# Patient Record
Sex: Female | Born: 1970 | Race: Black or African American | Hispanic: No | Marital: Single | Smoking: Former smoker
Health system: Southern US, Community
[De-identification: ages and names within clinical notes are randomized; demographics above are authoritative.]

## PROBLEM LIST (undated history)

## (undated) DIAGNOSIS — B009 Herpesviral infection, unspecified: Secondary | ICD-10-CM

## (undated) DIAGNOSIS — D219 Benign neoplasm of connective and other soft tissue, unspecified: Secondary | ICD-10-CM

## (undated) DIAGNOSIS — E7800 Pure hypercholesterolemia, unspecified: Secondary | ICD-10-CM

## (undated) DIAGNOSIS — R519 Headache, unspecified: Secondary | ICD-10-CM

## (undated) DIAGNOSIS — Z8619 Personal history of other infectious and parasitic diseases: Secondary | ICD-10-CM

## (undated) DIAGNOSIS — R51 Headache: Secondary | ICD-10-CM

## (undated) HISTORY — PX: CERVICAL BIOPSY: SHX590

---

## 2001-06-23 ENCOUNTER — Emergency Department (HOSPITAL_COMMUNITY): Admission: EM | Admit: 2001-06-23 | Discharge: 2001-06-23 | Payer: Self-pay | Admitting: Emergency Medicine

## 2001-07-23 ENCOUNTER — Emergency Department (HOSPITAL_COMMUNITY): Admission: EM | Admit: 2001-07-23 | Discharge: 2001-07-23 | Payer: Self-pay | Admitting: Emergency Medicine

## 2001-08-31 ENCOUNTER — Emergency Department (HOSPITAL_COMMUNITY): Admission: EM | Admit: 2001-08-31 | Discharge: 2001-08-31 | Payer: Self-pay | Admitting: Emergency Medicine

## 2001-09-07 ENCOUNTER — Ambulatory Visit (HOSPITAL_COMMUNITY): Admission: RE | Admit: 2001-09-07 | Discharge: 2001-09-07 | Payer: Self-pay | Admitting: Internal Medicine

## 2001-09-08 ENCOUNTER — Encounter: Payer: Self-pay | Admitting: Internal Medicine

## 2002-02-23 ENCOUNTER — Emergency Department (HOSPITAL_COMMUNITY): Admission: EM | Admit: 2002-02-23 | Discharge: 2002-02-23 | Payer: Self-pay | Admitting: Emergency Medicine

## 2003-03-03 ENCOUNTER — Encounter: Payer: Self-pay | Admitting: *Deleted

## 2003-03-03 ENCOUNTER — Emergency Department (HOSPITAL_COMMUNITY): Admission: EM | Admit: 2003-03-03 | Discharge: 2003-03-03 | Payer: Self-pay | Admitting: *Deleted

## 2003-12-26 ENCOUNTER — Emergency Department (HOSPITAL_COMMUNITY): Admission: EM | Admit: 2003-12-26 | Discharge: 2003-12-26 | Payer: Self-pay | Admitting: Emergency Medicine

## 2004-03-24 ENCOUNTER — Emergency Department (HOSPITAL_COMMUNITY): Admission: EM | Admit: 2004-03-24 | Discharge: 2004-03-24 | Payer: Self-pay | Admitting: Emergency Medicine

## 2004-04-22 ENCOUNTER — Emergency Department (HOSPITAL_COMMUNITY): Admission: EM | Admit: 2004-04-22 | Discharge: 2004-04-22 | Payer: Self-pay | Admitting: Emergency Medicine

## 2005-04-10 ENCOUNTER — Ambulatory Visit (HOSPITAL_COMMUNITY): Admission: RE | Admit: 2005-04-10 | Discharge: 2005-04-10 | Payer: Self-pay | Admitting: Internal Medicine

## 2005-04-16 ENCOUNTER — Emergency Department (HOSPITAL_COMMUNITY): Admission: EM | Admit: 2005-04-16 | Discharge: 2005-04-16 | Payer: Self-pay | Admitting: Emergency Medicine

## 2006-06-12 ENCOUNTER — Emergency Department (HOSPITAL_COMMUNITY): Admission: EM | Admit: 2006-06-12 | Discharge: 2006-06-12 | Payer: Self-pay | Admitting: Emergency Medicine

## 2007-03-11 ENCOUNTER — Emergency Department (HOSPITAL_COMMUNITY): Admission: EM | Admit: 2007-03-11 | Discharge: 2007-03-11 | Payer: Self-pay | Admitting: Emergency Medicine

## 2007-03-12 ENCOUNTER — Ambulatory Visit (HOSPITAL_COMMUNITY): Admission: AD | Admit: 2007-03-12 | Discharge: 2007-03-12 | Payer: Self-pay | Admitting: Obstetrics & Gynecology

## 2007-07-15 ENCOUNTER — Ambulatory Visit (HOSPITAL_COMMUNITY): Admission: RE | Admit: 2007-07-15 | Discharge: 2007-07-15 | Payer: Self-pay | Admitting: Obstetrics and Gynecology

## 2007-07-16 ENCOUNTER — Emergency Department (HOSPITAL_COMMUNITY): Admission: EM | Admit: 2007-07-16 | Discharge: 2007-07-16 | Payer: Self-pay | Admitting: Emergency Medicine

## 2007-07-22 ENCOUNTER — Inpatient Hospital Stay (HOSPITAL_COMMUNITY): Admission: AD | Admit: 2007-07-22 | Discharge: 2007-07-25 | Payer: Self-pay | Admitting: Obstetrics and Gynecology

## 2007-10-07 ENCOUNTER — Emergency Department (HOSPITAL_COMMUNITY): Admission: EM | Admit: 2007-10-07 | Discharge: 2007-10-07 | Payer: Self-pay | Admitting: Emergency Medicine

## 2008-09-25 ENCOUNTER — Emergency Department (HOSPITAL_COMMUNITY): Admission: EM | Admit: 2008-09-25 | Discharge: 2008-09-25 | Payer: Self-pay | Admitting: Emergency Medicine

## 2008-10-10 ENCOUNTER — Other Ambulatory Visit: Admission: RE | Admit: 2008-10-10 | Discharge: 2008-10-10 | Payer: Self-pay | Admitting: Obstetrics and Gynecology

## 2008-12-18 ENCOUNTER — Emergency Department (HOSPITAL_COMMUNITY): Admission: EM | Admit: 2008-12-18 | Discharge: 2008-12-18 | Payer: Self-pay | Admitting: Emergency Medicine

## 2008-12-26 ENCOUNTER — Emergency Department (HOSPITAL_COMMUNITY): Admission: EM | Admit: 2008-12-26 | Discharge: 2008-12-26 | Payer: Self-pay | Admitting: Emergency Medicine

## 2009-02-15 ENCOUNTER — Emergency Department (HOSPITAL_COMMUNITY): Admission: EM | Admit: 2009-02-15 | Discharge: 2009-02-15 | Payer: Self-pay | Admitting: Emergency Medicine

## 2009-03-01 ENCOUNTER — Emergency Department (HOSPITAL_COMMUNITY): Admission: EM | Admit: 2009-03-01 | Discharge: 2009-03-01 | Payer: Self-pay | Admitting: Emergency Medicine

## 2009-08-06 ENCOUNTER — Inpatient Hospital Stay (HOSPITAL_COMMUNITY): Admission: AD | Admit: 2009-08-06 | Discharge: 2009-08-06 | Payer: Self-pay | Admitting: Family Medicine

## 2009-08-09 ENCOUNTER — Emergency Department (HOSPITAL_COMMUNITY): Admission: EM | Admit: 2009-08-09 | Discharge: 2009-08-09 | Payer: Self-pay | Admitting: Emergency Medicine

## 2009-09-12 ENCOUNTER — Inpatient Hospital Stay (HOSPITAL_COMMUNITY): Admission: AD | Admit: 2009-09-12 | Discharge: 2009-09-12 | Payer: Self-pay | Admitting: Obstetrics & Gynecology

## 2009-10-02 ENCOUNTER — Ambulatory Visit: Payer: Self-pay | Admitting: Family Medicine

## 2009-10-02 ENCOUNTER — Inpatient Hospital Stay (HOSPITAL_COMMUNITY): Admission: AD | Admit: 2009-10-02 | Discharge: 2009-10-04 | Payer: Self-pay | Admitting: Obstetrics and Gynecology

## 2009-10-02 ENCOUNTER — Encounter: Payer: Self-pay | Admitting: Obstetrics and Gynecology

## 2010-07-28 ENCOUNTER — Emergency Department (HOSPITAL_COMMUNITY): Admission: EM | Admit: 2010-07-28 | Discharge: 2010-07-28 | Payer: Self-pay | Admitting: Family Medicine

## 2010-07-29 ENCOUNTER — Emergency Department (HOSPITAL_COMMUNITY): Admission: EM | Admit: 2010-07-29 | Discharge: 2010-07-29 | Payer: Self-pay | Admitting: Emergency Medicine

## 2010-10-29 ENCOUNTER — Emergency Department (HOSPITAL_COMMUNITY)
Admission: EM | Admit: 2010-10-29 | Discharge: 2010-10-29 | Payer: Self-pay | Source: Home / Self Care | Admitting: Emergency Medicine

## 2011-03-04 LAB — GLUCOSE, CAPILLARY
Glucose-Capillary: 105 mg/dL — ABNORMAL HIGH (ref 70–99)
Glucose-Capillary: 226 mg/dL — ABNORMAL HIGH (ref 70–99)
Glucose-Capillary: 53 mg/dL — ABNORMAL LOW (ref 70–99)
Glucose-Capillary: 55 mg/dL — ABNORMAL LOW (ref 70–99)
Glucose-Capillary: 65 mg/dL — ABNORMAL LOW (ref 70–99)
Glucose-Capillary: 74 mg/dL (ref 70–99)

## 2011-03-04 LAB — CBC
HCT: 32.3 % — ABNORMAL LOW (ref 36.0–46.0)
MCHC: 32.4 g/dL (ref 30.0–36.0)
RBC: 4.28 MIL/uL (ref 3.87–5.11)
RDW: 18.8 % — ABNORMAL HIGH (ref 11.5–15.5)
WBC: 10.9 10*3/uL — ABNORMAL HIGH (ref 4.0–10.5)

## 2011-03-05 LAB — COMPREHENSIVE METABOLIC PANEL
AST: 20 U/L (ref 0–37)
Albumin: 2.5 g/dL — ABNORMAL LOW (ref 3.5–5.2)
Alkaline Phosphatase: 107 U/L (ref 39–117)
GFR calc non Af Amer: >60 mL/min (ref >60)
Total Protein: 5.9 g/dL — ABNORMAL LOW (ref 6.0–8.3)

## 2011-03-05 LAB — CBC
Hemoglobin: 9.8 g/dL — ABNORMAL LOW (ref 12.0–15.0)
MCHC: 32.3 g/dL (ref 30.0–36.0)
RBC: 3.98 MIL/uL (ref 3.87–5.11)
RDW: 17.4 % — ABNORMAL HIGH (ref 11.5–15.5)

## 2011-03-05 LAB — URINALYSIS, ROUTINE W REFLEX MICROSCOPIC
Ketones, ur: NEGATIVE mg/dL
Nitrite: NEGATIVE
Urobilinogen, UA: 0.2 mg/dL (ref 0.0–1.0)

## 2011-03-11 LAB — WOUND CULTURE

## 2011-03-12 LAB — PREGNANCY, URINE: Preg Test, Ur: POSITIVE

## 2011-04-14 NOTE — Consult Note (Signed)
Tanya Austin, Tanya Austin NO.:  [id number]   MEDICAL RECORD NO.:  [id number]          PATIENT TYPE:  EMS   LOCATION:  ED                            FACILITY:  [facility]   PHYSICIAN:  Tilda Burrow, M.D. DATE OF BIRTH:  Apr 03, 1971   DATE OF CONSULTATION:  07/16/2007  DATE OF DISCHARGE:                                 CONSULTATION   This 40 year old female gravida 2, para 1, with diabetic pregnancy  followed at [facility], now just [redacted] weeks gestation presents  with complaints of elevated blood pressures.  She is being followed in  our office, and has had a pre-eclampsia workup this week with 24-hr  urine protein of approximately 180 mg/day protein.  She is diabetic and  is being managed with approximately 200 units of insulin per day.   She presents to the emergency room, where evaluation includes blood  pressure check showing:  blood pressure 141/77, pulse 95, respiratory  rate 20, temperature 97.8 at 1:15 p.m.  She was observed for a period of  time.  Her blood pressures remained in this range.  There are no  documented blood pressures greater than 90 diastolic, or greater than  145 systolic.   LABORATORY EVALUATION:  Urinalysis negative for protein, and negative  for evidence of infection.  Liver function tests are normal, with AST  23, ALT 18, BUN 6, creatinine 0.58.   The patient is observed and fetal monitoring has been done; reported to  me as being transmitted to [facility] for review.  The reflexes are 2+.  The baby is reported as active.  We will continue with the current plan,  which is to see her back on Tuesday for a cervical examination and  consideration of proceeding toward selecting induction date.      Tilda Burrow, M.D.  Electronically Signed     JVF/MEDQ  D:  07/16/2007  T:  07/17/2007  Job:  [id number]

## 2011-04-14 NOTE — H&P (Signed)
Tanya Austin, Tanya Austin             ACCOUNT NO.:  [id number]   MEDICAL RECORD NO.:  [id number]          PATIENT TYPE:  INP   LOCATION:  9120                          FACILITY:  [facility]   PHYSICIAN:  Lazaro Arms, M.D.   DATE OF BIRTH:  07/09/1971   DATE OF ADMISSION:  07/22/2007  DATE OF DISCHARGE:                              HISTORY & PHYSICAL   HISTORY OF PRESENT ILLNESS:  Tanya Austin is a 40 year old, African-American  female, G3, P2, AB0 with estimated date of delivery of August 01, 2007, currently at 38-5/7 weeks' gestation who is admitted for induction  of labor.  Pregnancy is complicated by Class B diabetes mellitus.  She  had also a couple of episodic elevated blood pressures for a 24-hour  urine and labs have always been normal and her blood pressures have  always normalized.  She has undergone twice weekly antepartum testing  all of which has been reassuring and she had an estimated fetal weight  at 36 weeks which put her at the 39th percentile of growth which would  go out to about 3300 g at this point.  The patient has been getting  increasingly swollen, uncomfortable in the last week an a half, so we  are going to proceed with induction of labor because of her diabetes  basically at 39 weeks.  Her hemoglobin A1c has progressively come down  during the pregnancy and her blood sugars, while she has been somewhat  sporadic in checking her blood sugars, have always been under good  control.  At the time of her last visit, she was on 60 of NPH and 50 of  regular in the morning, 44 NPH and 50 regular in the evening.  She has  not been very compliant with her diet.  The pregnancy, otherwise, has  been unremarkable and she has had no other antepartum problems.   PAST MEDICAL HISTORY:  Diabetes.   PAST SURGICAL HISTORY:  Negative.   ALLERGIES:  No known drug allergies.   PAST OBSTETRICAL HISTORY:  Vaginal deliveries in 1990, and 1994.   MEDICATIONS:  Zyrtec, prenatal vitamins,  Actos prior to the pregnancy  and metformin.  She was switched over to insulin.   PRENATAL LABORATORY DATA:  Blood type B positive.  Varicella immune.  Rubella immune.  Antibody screen was negative.  Hepatitis B was  negative.  HIV was negative.  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  Pap smear was normal with  negative HPV.  Serology was negative x2.  GC and Chlamydia were negative  x2.  Group B Streptococcus was negative.  AFP was normal.  She has been  anemic during the pregnancy.  She has had difficulty keeping up with her  vitamins and iron.   REVIEW OF SYSTEMS:  Otherwise negative.   PHYSICAL EXAMINATION:  HEENT:  Unremarkable.  NECK:  Thyroid normal.  LUNGS:  Clear.  HEART:  Regular rate and rhythm without murmurs, rubs or gallops.  BREASTS:  Deferred.  ABDOMEN:  Fundal height of 41 cm.  PELVIC:  Cervix is  essentially closed, 50% effaced, soft, posterior,  vertex and -3 station.  EXTREMITIES:  Warm with 2+ edema.  DTRs are 2+ with no clonus.   IMPRESSION:  1. Intrauterine pregnancy at 38-5/[redacted] weeks gestation.  2. Class B diabetes mellitus, under good control.  3. Unfavorable cervix.   PLAN:  The patient is admitted for Foley bulb ripening of cervix  followed by Pitocin induction of labor.  She understands the indications  for induction, the risks, benefits and will proceed.      Lazaro Arms, M.D.  Electronically Signed     LHE/MEDQ  D:  07/22/2007  T:  07/23/2007  Job:  [id number]

## 2011-04-14 NOTE — Op Note (Signed)
[name], [name]             ACCOUNT NO.:  [id number]   MEDICAL RECORD NO.:  [id number]          PATIENT TYPE:  INP   LOCATION:  9120                          FACILITY:  [facility]   PHYSICIAN:  Lazaro Arms, M.D.   DATE OF BIRTH:  1971/04/09   DATE OF PROCEDURE:  07/23/2007  DATE OF DISCHARGE:                               OPERATIVE REPORT   DELIVERY NOTE   Tanya Austin is a 40 year old African-American female, gravida 3, para 2,  abortus 0, estimated date of delivery September 1 at 23 and 6/7 weeks of  gestation who was admitted for induction of labor because of her class B  diabetes.  She has had reassuring fetal heart rate tracings over the  last several weeks twice weekly.  Blood sugars have been under fairly  good control despite sporadic checks and fairly poor diet compliance.   The patient was brought in, a Foley bulb was placed.  She underwent  amniotomy and then Pitocin induction of labor and subsequently had an  epidural placed.  She progressed nicely through the active phase of  labor and over an intact perineum delivered a delivered a viable female  infant at 17 with Apgars of 8 and 9, weighing 5 pounds and 15 ounces.  There is three-vessel cord.  Cord blood and cord gas were sent.  Placenta was delivered intact at 4:08.  It was somewhat calcified, the  uterus was firm below the umbilicus.  Blood loss for delivery was 250  mL.  The infant did have some terminal bradycardia and had deep  variables last several minutes but she was making good progress at  recovery and cord gas turned out to be pH 7.09 with I am sure a heavy  respiratory component, high PCO2.  Certainly no evidence of any  metabolic component.   The patient will undergo routine postpartum care.  We will cut her  insulin back to essentially early pregnancy levels which is right at  half of her current dosing.  Try to keep her essentially under  reasonable control but not too tight in this postpartum period.   Infant  will undergo routine neonatal care.      Lazaro Arms, M.D.  Electronically Signed     LHE/MEDQ  D:  07/23/2007  T:  07/24/2007  Job:  [id number]

## 2011-04-17 NOTE — Procedures (Signed)
[facility]  Patient:    Tanya Austin, Tanya Austin Visit Number: [id number] MRN: [id number]          Service Type: OUT Location: RAD Attending Physician:  Avon Gully Dictated by:   Avon Gully, M.D. Admit Date:  09/07/2001                            EKG Interpretations  PROCEDURE:  Stress Cardiolite.  INDICATION:  Chest pain.  DESCRIPTION OF PROCEDURE:  Resting EKG was obtained which showed normal sinus rhythm with a rate of 80 beats per minute.  There are no T wave or ST segment abnormalities.  Resting blood pressure was 120/68.  Patient was exercised according to [facility] standard protocol.  She was exercised for 5 minutes and 20 seconds and achieved 7.0 METs.  The maximum blood pressure was 170/50.  The maximum heart rate was 166 beats per minute, which was 86% of the maximum predicted heart rate.  There were no symptoms of chest pain or shortness of breath during the procedure.  There were no EKG changes suggestive of stress-induced ischemia.  Cardiolite was injected at the maximum heart rate. Patient has tolerated the procedure without any problem.  The patient is going to have the nuclear scan and the result will be followed. Dictated by:   Avon Gully, M.D. Attending Physician:  Avon Gully DD:  09/07/01 TD:  09/07/01 Job: [id number]

## 2011-04-17 NOTE — Procedures (Signed)
[facility]  Patient:    Tanya Austin, Tanya Austin Visit Number: [id number] MRN: [id number]          Service Type: OUT Location: RAD Attending Physician:  Avon Gully Dictated by:   Avon Gully, M.D. Proc. Date: 09/07/01 Admit Date:  09/07/2001 Discharge Date: 09/07/2001                                Stress Test    PROCEDURE:  Stress Cardiolite.  INDICATION:  Chest pain.  DESCRIPTION OF PROCEDURE:  Resting EKG was obtained which showed a normal sinus rhythm with a rate of 80 beats per minute.  There are no significant ST segment or T wave abnormalities. Dictated by:   Avon Gully, M.D. Attending Physician:  Avon Gully DD:  09/07/01 TD:  09/07/01 Job: [id number]

## 2011-05-02 ENCOUNTER — Emergency Department (HOSPITAL_COMMUNITY)
Admission: EM | Admit: 2011-05-02 | Discharge: 2011-05-02 | Disposition: A | Discharge status: Home / Self Care | Payer: Medicaid Other | Attending: Emergency Medicine | Admitting: Emergency Medicine

## 2011-05-02 DIAGNOSIS — B9789 Other viral agents as the cause of diseases classified elsewhere: Secondary | ICD-10-CM | POA: Insufficient documentation

## 2011-05-02 DIAGNOSIS — H9209 Otalgia, unspecified ear: Secondary | ICD-10-CM | POA: Insufficient documentation

## 2011-05-02 DIAGNOSIS — J029 Acute pharyngitis, unspecified: Secondary | ICD-10-CM | POA: Insufficient documentation

## 2011-05-02 DIAGNOSIS — E119 Type 2 diabetes mellitus without complications: Secondary | ICD-10-CM | POA: Insufficient documentation

## 2011-05-02 DIAGNOSIS — J3489 Other specified disorders of nose and nasal sinuses: Secondary | ICD-10-CM | POA: Insufficient documentation

## 2011-05-02 DIAGNOSIS — E785 Hyperlipidemia, unspecified: Secondary | ICD-10-CM | POA: Insufficient documentation

## 2011-05-06 ENCOUNTER — Emergency Department (HOSPITAL_COMMUNITY)
Admission: EM | Admit: 2011-05-06 | Discharge: 2011-05-06 | Disposition: A | Discharge status: Home / Self Care | Payer: Medicaid Other | Attending: Emergency Medicine | Admitting: Emergency Medicine

## 2011-05-06 DIAGNOSIS — E785 Hyperlipidemia, unspecified: Secondary | ICD-10-CM | POA: Insufficient documentation

## 2011-05-06 DIAGNOSIS — E119 Type 2 diabetes mellitus without complications: Secondary | ICD-10-CM | POA: Insufficient documentation

## 2011-05-06 DIAGNOSIS — J3489 Other specified disorders of nose and nasal sinuses: Secondary | ICD-10-CM | POA: Insufficient documentation

## 2011-05-06 DIAGNOSIS — J329 Chronic sinusitis, unspecified: Secondary | ICD-10-CM | POA: Insufficient documentation

## 2011-08-31 LAB — GLUCOSE, CAPILLARY: Glucose-Capillary: 98

## 2011-09-11 LAB — COMPREHENSIVE METABOLIC PANEL
ALT: 18
AST: 52 — ABNORMAL HIGH
Albumin: 2.5 — ABNORMAL LOW
Alkaline Phosphatase: 101
Chloride: 106
Creatinine, Ser: 0.52
GFR calc Af Amer: >60
Potassium: 5.4 — ABNORMAL HIGH
Sodium: 133 — ABNORMAL LOW
Total Bilirubin: 1.3 — ABNORMAL HIGH

## 2011-09-11 LAB — BASIC METABOLIC PANEL
BUN: 6
CO2: 25
Calcium: 8.6
Chloride: 108
Creatinine, Ser: 0.58

## 2011-09-11 LAB — URIC ACID: Uric Acid, Serum: 5.3

## 2011-09-11 LAB — HEPATIC FUNCTION PANEL
Alkaline Phosphatase: 107
Bilirubin, Direct: <0.1
Total Bilirubin: 0.5

## 2011-09-11 LAB — URINALYSIS, ROUTINE W REFLEX MICROSCOPIC

## 2011-09-11 LAB — URINALYSIS, DIPSTICK ONLY
Bilirubin Urine: NEGATIVE
Glucose, UA: NEGATIVE
Hgb urine dipstick: NEGATIVE
Protein, ur: NEGATIVE

## 2011-09-11 LAB — CBC
Platelets: 290
WBC: 10

## 2011-12-27 ENCOUNTER — Encounter (HOSPITAL_COMMUNITY): Payer: Self-pay | Admitting: Emergency Medicine

## 2011-12-27 ENCOUNTER — Emergency Department (HOSPITAL_COMMUNITY)
Admission: EM | Admit: 2011-12-27 | Discharge: 2011-12-27 | Disposition: A | Discharge status: Home / Self Care | Payer: Medicaid Other | Attending: Emergency Medicine | Admitting: Emergency Medicine

## 2011-12-27 DIAGNOSIS — J019 Acute sinusitis, unspecified: Secondary | ICD-10-CM | POA: Insufficient documentation

## 2011-12-27 DIAGNOSIS — J3489 Other specified disorders of nose and nasal sinuses: Secondary | ICD-10-CM | POA: Insufficient documentation

## 2011-12-27 DIAGNOSIS — R51 Headache: Secondary | ICD-10-CM | POA: Insufficient documentation

## 2011-12-27 DIAGNOSIS — E7800 Pure hypercholesterolemia, unspecified: Secondary | ICD-10-CM | POA: Insufficient documentation

## 2011-12-27 DIAGNOSIS — Z79899 Other long term (current) drug therapy: Secondary | ICD-10-CM | POA: Insufficient documentation

## 2011-12-27 DIAGNOSIS — E119 Type 2 diabetes mellitus without complications: Secondary | ICD-10-CM | POA: Insufficient documentation

## 2011-12-27 HISTORY — DX: Pure hypercholesterolemia, unspecified: E78.00

## 2011-12-27 MED ORDER — AMOXICILLIN 500 MG PO CAPS: 500.0000 mg | ORAL_CAPSULE | Freq: Three times a day (TID) | ORAL | Status: AC

## 2011-12-27 MED ORDER — HYDROCODONE-ACETAMINOPHEN 5-325 MG PO TABS: 1.0000 | ORAL_TABLET | ORAL | Status: AC | PRN

## 2011-12-27 MED ORDER — OXYMETAZOLINE HCL 0.05 % NA SOLN
2.0000 | Freq: Once | NASAL | Status: AC
  Administered 2011-12-27: 2 via NASAL
  Filled 2011-12-27: qty 15

## 2011-12-27 MED ORDER — HYDROCODONE-ACETAMINOPHEN 5-325 MG PO TABS
1.0000 | ORAL_TABLET | Freq: Once | ORAL | Status: AC
  Administered 2011-12-27: 1 via ORAL
  Filled 2011-12-27: qty 1

## 2011-12-27 NOTE — ED Notes (Signed)
C/o sinus pressure x 1 week.  Denies cough.  Reports runny nose, sore throat, ear pain, and congestion.

## 2011-12-27 NOTE — ED Provider Notes (Signed)
History     CSN: [id number]  Arrival date & time 12/27/11  1830   First MD Initiated Contact with Patient 12/27/11 2043      Chief Complaint  Patient presents with  . Sinus Problem    (Consider location/radiation/quality/duration/timing/severity/associated sxs/prior treatment) Patient is a 41 y.o. female presenting with sinus complaint. The history is provided by the patient.  Sinus Problem This is a new problem. The current episode started in the past 7 days. The problem occurs constantly. The problem has been gradually worsening. Associated symptoms include congestion. Pertinent negatives include no swollen glands.  Patient reports a week long history of worsening sinus congestion and pressure that has now evolved into bilateral facial pain that radiates into her teeth. States mild cold symptoms started last Monday, but have progressed over the last several days. States she is unable to breathe through either side of her nose.  Past Medical History  Diagnosis Date  . Diabetes mellitus   . High cholesterol     History reviewed. No pertinent past surgical history.  No family history on file.  History  Substance Use Topics  . Smoking status: Never Smoker   . Smokeless tobacco: Not on file  . Alcohol Use: No    OB History    Grav Para Term Preterm Abortions TAB SAB Ect Mult Living                  Review of Systems  Constitutional: Negative.   HENT: Positive for congestion.   Eyes: Negative.   Respiratory: Negative.   Cardiovascular: Negative.   Gastrointestinal: Negative.   Genitourinary: Negative.   Musculoskeletal: Negative.   Skin: Negative.   Neurological: Negative.   Hematological: Negative.   Psychiatric/Behavioral: Negative.     Allergies  Review of patient's allergies indicates no known allergies.  Home Medications   Current Outpatient Rx  Name Route Sig Dispense Refill  . GLYBURIDE-METFORMIN 2.5-500 MG PO TABS Oral Take 1-2 tablets by mouth 2  (two) times daily with a meal. Takes 2 tablets in the morning, and 1 tablet at night.    Darlis Loan ESTRAD TRIPHASIC 0.18/0.215/0.25 MG-35 MCG PO TABS Oral Take 1 tablet by mouth daily.    Marland Kitchen PIOGLITAZONE HCL 30 MG PO TABS Oral Take 30 mg by mouth daily.    Marland Kitchen SIMVASTATIN 20 MG PO TABS Oral Take 20 mg by mouth every evening.    Marland Kitchen AMOXICILLIN 500 MG PO CAPS Oral Take 1 capsule (500 mg total) by mouth 3 (three) times daily. 30 capsule 0  . HYDROCODONE-ACETAMINOPHEN 5-325 MG PO TABS Oral Take 1 tablet by mouth every 4 (four) hours as needed for pain. 10 tablet 0    BP 145/76  Pulse 98  Temp(Src) 98.5 F (36.9 C) (Oral)  Resp 18  SpO2 99%  LMP 12/07/2011  Physical Exam  Constitutional: She is oriented to person, place, and time. She appears well-developed and well-nourished.  HENT:  Head: Normocephalic and atraumatic.  Right Ear: Hearing, tympanic membrane, external ear and ear canal normal.  Left Ear: Hearing, tympanic membrane, external ear and ear canal normal.  Nose: Mucosal edema, rhinorrhea and sinus tenderness present. Right sinus exhibits maxillary sinus tenderness and frontal sinus tenderness. Left sinus exhibits maxillary sinus tenderness and frontal sinus tenderness.  Mouth/Throat: Uvula is midline, oropharynx is clear and moist and mucous membranes are normal.  Eyes: Conjunctivae are normal.  Neck: Neck supple.  Cardiovascular: Normal rate and regular rhythm.   Pulmonary/Chest: Effort normal and  breath sounds normal.  Musculoskeletal: Normal range of motion.  Neurological: She is alert and oriented to person, place, and time.  Skin: Skin is warm and dry. No erythema.  Psychiatric: She has a normal mood and affect.    ED Course  Procedures Findings and clinical impression discussed with patient. Will plan for discharge home on Amoxicillin for acute sinusitis and encourage follow up with her primary care physician if not improving over the next several days. Patient  agreeable with plan.  Labs Reviewed - No data to display No results found.   1. Sinusitis, acute       MDM  HPI/PE and clinical findings c/w acute sinusitis.        Leanne Chang, NP 12/27/11 [phone]

## 2011-12-27 NOTE — ED Notes (Signed)
Pt to ED c/o sinus pressure/congestion that radiates to her teeth and into her ears x 1 week.  Her eyes are also red with pus.  Pt states her child recently experienced the same symptoms.

## 2011-12-28 NOTE — ED Provider Notes (Signed)
Medical screening examination/treatment/procedure(s) were performed by non-physician practitioner and as supervising physician I was immediately available for consultation/collaboration.   Dayton Bailiff, MD 12/28/11 [phone]

## 2012-04-18 ENCOUNTER — Encounter (HOSPITAL_COMMUNITY): Payer: Self-pay | Admitting: *Deleted

## 2012-04-18 ENCOUNTER — Emergency Department (HOSPITAL_COMMUNITY)
Admission: EM | Admit: 2012-04-18 | Discharge: 2012-04-18 | Disposition: A | Discharge status: Home / Self Care | Payer: Medicaid Other | Attending: Emergency Medicine | Admitting: Emergency Medicine

## 2012-04-18 DIAGNOSIS — E119 Type 2 diabetes mellitus without complications: Secondary | ICD-10-CM | POA: Insufficient documentation

## 2012-04-18 DIAGNOSIS — E7800 Pure hypercholesterolemia, unspecified: Secondary | ICD-10-CM | POA: Insufficient documentation

## 2012-04-18 DIAGNOSIS — J069 Acute upper respiratory infection, unspecified: Secondary | ICD-10-CM | POA: Insufficient documentation

## 2012-04-18 MED ORDER — PROMETHAZINE-DM 6.25-15 MG/5ML PO SYRP: 5.0000 mL | ORAL_SOLUTION | Freq: Four times a day (QID) | ORAL | Status: AC | PRN

## 2012-04-18 MED ORDER — BUDESONIDE 32 MCG/ACT NA SUSP: 2.0000 | Freq: Every day | NASAL | Status: DC

## 2012-04-18 MED ORDER — GUAIFENESIN ER 1200 MG PO TB12: 1.0000 | ORAL_TABLET | Freq: Two times a day (BID) | ORAL | Status: DC

## 2012-04-18 NOTE — Discharge Instructions (Signed)
Return here as needed. Follow up with your doctor. Increase your fluids as well.

## 2012-04-18 NOTE — ED Notes (Signed)
PT ambulated with a  Steady gait; VSS; A&Ox3; no signs of distress; respirations even and unlabored; skin warm and dry; no questions at this time.  

## 2012-04-18 NOTE — ED Notes (Signed)
To ED for eval of cough and congestion for the past cple days. No fevers.

## 2012-04-18 NOTE — ED Provider Notes (Signed)
History     CSN: [id number]  Arrival date & time 04/18/12  1478   First MD Initiated Contact with Patient 04/18/12 1857      Chief Complaint  Patient presents with  . Cough    (Consider location/radiation/quality/duration/timing/severity/associated sxs/prior treatment) HPI Patient presents emergency Dept. with nasal congestion, and cough for the last 2 days.  She states, that she is not having any fevers, nausea, vomiting, diarrhea, abdominal pain, chest pain, shortness of breath, headache, or weakness.  Patient, states she had tried any treatment at home for any of her symptoms.  She states she feels like she has problems with seasonal allergies.  She states, that she has not on any medications for this.  Patient, states nothing seems to make her condition worse or better.  Past Medical History  Diagnosis Date  . Diabetes mellitus   . High cholesterol     History reviewed. No pertinent past surgical history.  History reviewed. No pertinent family history.  History  Substance Use Topics  . Smoking status: Never Smoker   . Smokeless tobacco: Not on file  . Alcohol Use: No    OB History    Grav Para Term Preterm Abortions TAB SAB Ect Mult Living                  Review of Systems All other systems negative except as documented in the HPI. All pertinent positives and negatives as reviewed in the HPI.   Allergies  Review of patient's allergies indicates no known allergies.  Home Medications   Current Outpatient Rx  Name Route Sig Dispense Refill  . FERROUS FUMARATE 325 (106 FE) MG PO TABS Oral Take 1 tablet by mouth daily.    . OMEGA-3 FATTY ACIDS 1000 MG PO CAPS Oral Take 1 g by mouth daily.    . GLYBURIDE-METFORMIN 2.5-500 MG PO TABS Oral Take 1-2 tablets by mouth 2 (two) times daily with a meal. Takes 2 tablets in the morning, and 1 tablet at night.    Darlis Loan ESTRAD TRIPHASIC 0.18/0.215/0.25 MG-35 MCG PO TABS Oral Take 1 tablet by mouth daily.    Marland Kitchen  PIOGLITAZONE HCL 30 MG PO TABS Oral Take 30 mg by mouth daily.    Marland Kitchen SIMVASTATIN 20 MG PO TABS Oral Take 20 mg by mouth daily.       BP 123/61  Pulse 77  Temp(Src) 98.3 F (36.8 C) (Oral)  Resp 16  SpO2 99%  Physical Exam Physical Examination: General appearance - alert, well appearing, and in no distress, oriented to person, place, and time and normal appearing weight Mental status - alert, oriented to person, place, and time Eyes - pupils equal and reactive, extraocular eye movements intact Ears - bilateral TM's and external ear canals normal Nose - normal and patent, no erythema, discharge or polyps Mouth - mucous membranes moist, pharynx normal without lesions Neck - supple, no significant adenopathy Lymphatics - no palpable lymphadenopathy, no hepatosplenomegaly Chest - clear to auscultation, no wheezes, rales or rhonchi, symmetric air entry, no tachypnea, retractions or cyanosis Heart - normal rate, regular rhythm, normal S1, S2, no murmurs, rubs, clicks or gallops Skin - normal coloration and turgor, no rashes, no suspicious skin lesions noted  ED Course  Procedures (including critical care time)   Will be treated for URI vs allergic symptoms. Told to follow up with her PCP. Increase her fluids.   MDM          Carlyle Dolly, PA-C  04/18/12 1946 

## 2012-04-21 NOTE — ED Provider Notes (Signed)
Medical screening examination/treatment/procedure(s) were performed by non-physician practitioner and as supervising physician I was immediately available for consultation/collaboration.  Cheri Guppy, MD 04/21/12 [phone]

## 2012-05-30 ENCOUNTER — Encounter (INDEPENDENT_AMBULATORY_CARE_PROVIDER_SITE_OTHER): Payer: Medicaid Other | Admitting: Ophthalmology

## 2012-06-08 ENCOUNTER — Encounter (INDEPENDENT_AMBULATORY_CARE_PROVIDER_SITE_OTHER): Payer: Medicaid Other | Admitting: Ophthalmology

## 2012-06-08 DIAGNOSIS — E1139 Type 2 diabetes mellitus with other diabetic ophthalmic complication: Secondary | ICD-10-CM

## 2012-06-08 DIAGNOSIS — H43819 Vitreous degeneration, unspecified eye: Secondary | ICD-10-CM

## 2012-06-08 DIAGNOSIS — H18619 Keratoconus, stable, unspecified eye: Secondary | ICD-10-CM

## 2012-06-08 DIAGNOSIS — E11319 Type 2 diabetes mellitus with unspecified diabetic retinopathy without macular edema: Secondary | ICD-10-CM

## 2012-06-08 DIAGNOSIS — H33309 Unspecified retinal break, unspecified eye: Secondary | ICD-10-CM

## 2012-09-05 ENCOUNTER — Other Ambulatory Visit: Payer: Self-pay | Admitting: Family Medicine

## 2012-09-12 ENCOUNTER — Encounter (HOSPITAL_COMMUNITY): Payer: Self-pay | Admitting: Emergency Medicine

## 2012-09-12 ENCOUNTER — Emergency Department (HOSPITAL_COMMUNITY)
Admission: EM | Admit: 2012-09-12 | Discharge: 2012-09-12 | Disposition: A | Discharge status: Home Health | Payer: Medicaid Other | Attending: Emergency Medicine | Admitting: Emergency Medicine

## 2012-09-12 ENCOUNTER — Emergency Department (HOSPITAL_COMMUNITY): Payer: Medicaid Other

## 2012-09-12 DIAGNOSIS — R0789 Other chest pain: Secondary | ICD-10-CM

## 2012-09-12 DIAGNOSIS — R071 Chest pain on breathing: Secondary | ICD-10-CM | POA: Insufficient documentation

## 2012-09-12 DIAGNOSIS — K219 Gastro-esophageal reflux disease without esophagitis: Secondary | ICD-10-CM | POA: Insufficient documentation

## 2012-09-12 DIAGNOSIS — J309 Allergic rhinitis, unspecified: Secondary | ICD-10-CM | POA: Insufficient documentation

## 2012-09-12 DIAGNOSIS — E119 Type 2 diabetes mellitus without complications: Secondary | ICD-10-CM | POA: Insufficient documentation

## 2012-09-12 DIAGNOSIS — E7800 Pure hypercholesterolemia, unspecified: Secondary | ICD-10-CM | POA: Insufficient documentation

## 2012-09-12 LAB — CBC WITH DIFFERENTIAL/PLATELET
Eosinophils Relative: 2 % (ref 0–5)
HCT: 37.9 % (ref 36.0–46.0)
Lymphocytes Relative: 41 % (ref 12–46)
Lymphs Abs: 3.1 10*3/uL (ref 0.7–4.0)
MCV: 84.4 fL (ref 78.0–100.0)
Monocytes Absolute: 0.5 10*3/uL (ref 0.1–1.0)
RDW: 12.8 % (ref 11.5–15.5)
WBC: 7.7 10*3/uL (ref 4.0–10.5)

## 2012-09-12 LAB — BASIC METABOLIC PANEL
CO2: 25 mEq/L (ref 19–32)
Calcium: 9.5 mg/dL (ref 8.4–10.5)
Creatinine, Ser: 0.66 mg/dL (ref 0.50–1.10)
Glucose, Bld: 132 mg/dL — ABNORMAL HIGH (ref 70–99)

## 2012-09-12 MED ORDER — IBUPROFEN 400 MG PO TABS: 400.0000 mg | ORAL_TABLET | Freq: Four times a day (QID) | ORAL | Status: DC | PRN

## 2012-09-12 MED ORDER — LORATADINE 10 MG PO TABS: 10.0000 mg | ORAL_TABLET | Freq: Every day | ORAL | Status: DC

## 2012-09-12 MED ORDER — KETOROLAC TROMETHAMINE 30 MG/ML IJ SOLN: 30.0000 mg | Freq: Once | INTRAMUSCULAR | Status: DC

## 2012-09-12 MED ORDER — KETOROLAC TROMETHAMINE 30 MG/ML IJ SOLN
30.0000 mg | Freq: Once | INTRAMUSCULAR | Status: AC
  Administered 2012-09-12: 30 mg via INTRAMUSCULAR
  Filled 2012-09-12: qty 1

## 2012-09-12 MED ORDER — GI COCKTAIL ~~LOC~~
30.0000 mL | Freq: Once | ORAL | Status: AC
  Administered 2012-09-12: 30 mL via ORAL
  Filled 2012-09-12: qty 30

## 2012-09-12 NOTE — ED Provider Notes (Signed)
History     CSN: [id number]  Arrival date & time 09/12/12  1412   First MD Initiated Contact with Patient 09/12/12 1853      Chief Complaint  Patient presents with  . Chest Pain    (Consider location/radiation/quality/duration/timing/severity/associated sxs/prior treatment) HPI Tanya Austin is a 41 y.o. female complaining of 2 days of chest pain. She says 2 weeks ago his chest pain had been intermittent but has been more frequent over the last 2 days. She says is worse on standing better lying down, no cough, no shortness of breath, no associated nausea vomiting diaphoresis or radiation. She says the food makes it better. He says that sometimes she gets a burning up higher (she's points to the midsternal region and she draws a line going down to her xiphoid process.)   She says his pain is mild to moderate, and sometimes she also has some associated bilateral shoulder pain. Shoulder pain is on the tops of the shoulders near the insertion point of the trapezius muscle is worse with palpation.  Patient also describing  Symptoms consistent with allergic rhinitis including rhinorrhea, congestion, itchy watery eyes; this has worsened recently. She has not taken anything for it.  Past Medical History  Diagnosis Date  . Diabetes mellitus   . High cholesterol     History reviewed. No pertinent past surgical history.  History reviewed. No pertinent family history.  History  Substance Use Topics  . Smoking status: Never Smoker   . Smokeless tobacco: Not on file  . Alcohol Use: No    OB History    Grav Para Term Preterm Abortions TAB SAB Ect Mult Living                  Review of Systems At least 10pt or greater review of systems completed and are negative except where specified in the HPI.  Allergies  Review of patient's allergies indicates no known allergies.  Home Medications   Current Outpatient Rx  Name Route Sig Dispense Refill  . OMEGA-3 FATTY ACIDS 1000 MG PO  CAPS Oral Take 1 g by mouth daily.    . GLYBURIDE-METFORMIN 2.5-500 MG PO TABS Oral Take 1-2 tablets by mouth 2 (two) times daily with a meal. Takes 2 tablets in the morning, and 1 tablet at night.    Darlis Loan ESTRAD TRIPHASIC 0.18/0.215/0.25 MG-35 MCG PO TABS Oral Take 1 tablet by mouth daily.    Marland Kitchen PIOGLITAZONE HCL 15 MG PO TABS Oral Take 15 mg by mouth daily.    Marland Kitchen SIMVASTATIN 20 MG PO TABS Oral Take 20 mg by mouth daily.       BP 119/59  Pulse 76  Temp 98 F (36.7 C) (Oral)  Resp 14  SpO2 99%  LMP 08/24/2012  Physical Exam  Nursing notes reviewed.  Electronic medical record reviewed. VITAL SIGNS:   Filed Vitals:   09/12/12 2000 09/12/12 2030 09/12/12 2100 09/12/12 2130  BP: 124/75 118/69 129/75 127/85  Pulse: 66 68 61 65  Temp:      TempSrc:      Resp: 24 21 21 17   SpO2: 98% 100% 99% 99%   CONSTITUTIONAL: Awake, oriented, appears non-toxic HENT: Atraumatic, normocephalic, oral mucosa pink and moist, airway patent. Nares patent without drainage. External ears normal. EYES: Conjunctiva clear, EOMI, PERRLA NECK: Trachea midline, non-tender, supple CARDIOVASCULAR: Normal heart rate, Normal rhythm, No murmurs, rubs, gallops PULMONARY/CHEST: Clear to auscultation, no rhonchi, wheezes, or rales. Symmetrical breath sounds. Non-tender. ABDOMINAL: Non-distended,  soft, non-tender - no rebound or guarding.  BS normal. NEUROLOGIC: Non-focal, moving all four extremities, no gross sensory or motor deficits. EXTREMITIES: No clubbing, cyanosis, or edema SKIN: Warm, Dry, No erythema, No rash  ED Course  Procedures (including critical care time)  Date: 09/13/2012  Rate: 76  Rhythm: Sinus arrhythmia  QRS Axis: normal  Intervals: normal  ST/T Wave abnormalities: normal  Conduction Disutrbances: none  Narrative Interpretation: unremarkable -   Labs Reviewed  BASIC METABOLIC PANEL - Abnormal; Notable for the following:    Glucose, Bld 132 (*)     All other components within  normal limits  CBC WITH DIFFERENTIAL  POCT PREGNANCY, URINE  POCT I-STAT TROPONIN I   Dg Chest 2 View  09/12/2012  *RADIOLOGY REPORT*  Clinical Data: Chest pain.  CHEST - 2 VIEW  Comparison: None.  Findings: Cardiomediastinal silhouette appears normal.  No acute pulmonary disease is noted.  Bony thorax is intact.  IMPRESSION: No acute cardiopulmonary abnormality seen.   Original Report Authenticated By: Venita Sheffield., M.D.    1. Chest wall pain   2. GERD (gastroesophageal reflux disease)   3. Allergic rhinitis     MDM  Tanya Austin is a 41 y.o. female presenting with atypical chest pain - more suggestive of GERD for functional dyspepsia. In addition she's complaining about bilateral shoulder pain. Patient also has complaints of allergic rhinitis.  Patient's pain seems musculoskeletal, shoulder pain is reproducible, chest pain is reproducible. Patient's labs are unremarkable, cardiac biomarkers are negative after a few days of symptoms. Patient has no suggestion of life-threatening intrathoracic emergency. Patient discharged home with ibuprofen Toradol gave her 100% relief of her chest pain. We'll also discharge her with a prescription for Claritin.  Patient does have followup tomorrow with her primary care physician.  I explained the diagnosis and have given explicit precautions to return to the ER including chest pain, shortness of breath, nausea vomiting or any other new or worsening symptoms. The patient understands and accepts the medical plan as it's been dictated and I have answered their questions. Discharge instructions concerning home care and prescriptions have been given.  The patient is STABLE and is discharged to home in good condition.            Jones Skene, MD 09/13/12 [phone]

## 2012-09-12 NOTE — ED Notes (Signed)
Pt c/o generalized CP into bil shoulders x 2 days with sob

## 2013-04-10 ENCOUNTER — Encounter (HOSPITAL_COMMUNITY): Payer: Self-pay | Admitting: Emergency Medicine

## 2013-04-10 ENCOUNTER — Emergency Department (HOSPITAL_COMMUNITY)
Admission: EM | Admit: 2013-04-10 | Discharge: 2013-04-10 | Disposition: A | Discharge status: Home / Self Care | Payer: Medicaid Other | Attending: Emergency Medicine | Admitting: Emergency Medicine

## 2013-04-10 DIAGNOSIS — B354 Tinea corporis: Secondary | ICD-10-CM

## 2013-04-10 DIAGNOSIS — E7800 Pure hypercholesterolemia, unspecified: Secondary | ICD-10-CM | POA: Insufficient documentation

## 2013-04-10 DIAGNOSIS — Z79899 Other long term (current) drug therapy: Secondary | ICD-10-CM | POA: Insufficient documentation

## 2013-04-10 DIAGNOSIS — E119 Type 2 diabetes mellitus without complications: Secondary | ICD-10-CM | POA: Insufficient documentation

## 2013-04-10 MED ORDER — FLUCONAZOLE 150 MG PO TABS: 150.0000 mg | ORAL_TABLET | ORAL | Status: AC

## 2013-04-10 NOTE — ED Notes (Signed)
Pt c/o small red areas all over her body for several days with itching.

## 2013-04-10 NOTE — ED Notes (Signed)
Red bloatchy rash ringworm she sthinks that has been going on x 3 days called her dr and they could see her on wed but she could not wait

## 2013-04-10 NOTE — ED Provider Notes (Signed)
History  This chart was scribed for [employer]. Rubin Payor, MD by Ardeen Jourdain, ED Scribe. This patient was seen in room TR09C/TR09C and the patient's care was started at 1625.  CSN: [id number]  Arrival date & time 04/10/13  1431   First MD Initiated Contact with Patient 04/10/13 1625      Chief Complaint  Patient presents with  . Rash     The history is provided by the patient. No language interpreter was used.    HPI Comments: Tanya Austin is a 42 y.o. female who presents to the Emergency Department complaining of gradual onset, gradually worsening, constant rash to her generalized body that began 3 days ago. Pt has associated redness and itching. Pt states she has a h/o ringworm and states she thinks she has it again. She states her family members had a similar rash a few months ago. She deneis any nausea, emesis, fever and chills as associated symptoms.   Past Medical History  Diagnosis Date  . Diabetes mellitus   . High cholesterol     No past surgical history on file.  No family history on file.  History  Substance Use Topics  . Smoking status: Never Smoker   . Smokeless tobacco: Not on file  . Alcohol Use: No    No OB history available.   Review of Systems  Skin: Positive for rash.  All other systems reviewed and are negative.    Allergies  Review of patient's allergies indicates no known allergies.  Home Medications   Current Outpatient Rx  Name  Route  Sig  Dispense  Refill  . fish oil-omega-3 fatty acids 1000 MG capsule   Oral   Take 1 g by mouth daily.         Marland Kitchen glyBURIDE-metformin (GLUCOVANCE) 2.5-500 MG per tablet   Oral   Take 1-2 tablets by mouth 2 (two) times daily with a meal. Takes 2 tablets in the morning, and 1 tablet at night.         . Norgestimate-Ethinyl Estradiol Triphasic (TRINESSA, 28,) 0.18/0.215/0.25 MG-35 MCG tablet   Oral   Take 1 tablet by mouth daily.         . pioglitazone (ACTOS) 15 MG tablet   Oral   Take  15 mg by mouth daily.         . simvastatin (ZOCOR) 20 MG tablet   Oral   Take 20 mg by mouth daily.          . vitamin B-12 (CYANOCOBALAMIN) 100 MCG tablet   Oral   Take 50 mcg by mouth daily.         . fluconazole (DIFLUCAN) 150 MG tablet   Oral   Take 1 tablet (150 mg total) by mouth once a week.   3 tablet   0     BP 130/69  Pulse 73  Temp(Src) 98.6 F (37 C)  Resp 16  SpO2 9%  Physical Exam  Nursing note and vitals reviewed. Constitutional: She is oriented to person, place, and time. She appears well-developed and well-nourished. No distress.  HENT:  Head: Normocephalic and atraumatic.  Eyes: EOM are normal. Pupils are equal, round, and reactive to light.  Neck: Normal range of motion. Neck supple. No tracheal deviation present.  Cardiovascular: Normal rate.   Pulmonary/Chest: Effort normal. No respiratory distress. She has no wheezes.  Abdominal: Soft. She exhibits no distension.  Musculoskeletal: Normal range of motion. She exhibits no edema.  Lymphadenopathy:  She has no cervical adenopathy.  Neurological: She is alert and oriented to person, place, and time.  Skin: Skin is warm and dry. She is not diaphoretic.  1 cm raised areas with erythema and mild induration. Areas pruritic and blanching    Psychiatric: She has a normal mood and affect. Her behavior is normal.    ED Course  Procedures (including critical care time)  4:30 PM-Discussed treatment plan which includes antifungal medication with pt at bedside and pt agreed to plan.   Labs Reviewed - No data to display No results found.   1. Tinea corporis       MDM  Patient with rash on body. May be tinea, which family members at bed. She's previously been on creams. We'll treat with oral medications and will follow with her primary care Dr.      I personally performed the services described in this documentation, which was scribed in my presence. The recorded information has been  reviewed and is accurate.     Juliet Rude. Rubin Payor, MD 04/10/13 1643

## 2013-06-09 ENCOUNTER — Ambulatory Visit (INDEPENDENT_AMBULATORY_CARE_PROVIDER_SITE_OTHER): Payer: Medicaid Other | Admitting: Ophthalmology

## 2013-06-22 ENCOUNTER — Ambulatory Visit (INDEPENDENT_AMBULATORY_CARE_PROVIDER_SITE_OTHER): Payer: Self-pay | Admitting: Ophthalmology

## 2013-06-22 DIAGNOSIS — E11319 Type 2 diabetes mellitus with unspecified diabetic retinopathy without macular edema: Secondary | ICD-10-CM

## 2013-06-22 DIAGNOSIS — E1039 Type 1 diabetes mellitus with other diabetic ophthalmic complication: Secondary | ICD-10-CM

## 2013-06-22 DIAGNOSIS — H33309 Unspecified retinal break, unspecified eye: Secondary | ICD-10-CM

## 2013-06-22 DIAGNOSIS — H43819 Vitreous degeneration, unspecified eye: Secondary | ICD-10-CM

## 2013-08-07 ENCOUNTER — Other Ambulatory Visit (HOSPITAL_COMMUNITY)
Admission: RE | Admit: 2013-08-07 | Discharge: 2013-08-07 | Disposition: A | Discharge status: Home / Self Care | Payer: Medicaid Other | Source: Ambulatory Visit | Attending: Family Medicine | Admitting: Family Medicine

## 2013-08-07 ENCOUNTER — Other Ambulatory Visit: Payer: Self-pay | Admitting: Family Medicine

## 2013-08-07 DIAGNOSIS — N760 Acute vaginitis: Secondary | ICD-10-CM | POA: Insufficient documentation

## 2013-08-07 DIAGNOSIS — Z113 Encounter for screening for infections with a predominantly sexual mode of transmission: Secondary | ICD-10-CM | POA: Insufficient documentation

## 2013-10-24 ENCOUNTER — Other Ambulatory Visit (HOSPITAL_COMMUNITY)
Admission: RE | Admit: 2013-10-24 | Discharge: 2013-10-24 | Disposition: A | Discharge status: Home / Self Care | Payer: Medicaid Other | Source: Ambulatory Visit | Attending: Family Medicine | Admitting: Family Medicine

## 2013-10-24 ENCOUNTER — Other Ambulatory Visit: Payer: Self-pay | Admitting: Family Medicine

## 2013-10-24 DIAGNOSIS — N760 Acute vaginitis: Secondary | ICD-10-CM | POA: Insufficient documentation

## 2013-10-24 DIAGNOSIS — Z113 Encounter for screening for infections with a predominantly sexual mode of transmission: Secondary | ICD-10-CM | POA: Insufficient documentation

## 2013-12-12 ENCOUNTER — Emergency Department (HOSPITAL_COMMUNITY)
Admission: EM | Admit: 2013-12-12 | Discharge: 2013-12-12 | Disposition: A | Discharge status: Home / Self Care | Payer: Medicaid Other | Attending: Emergency Medicine | Admitting: Emergency Medicine

## 2013-12-12 ENCOUNTER — Encounter (HOSPITAL_COMMUNITY): Payer: Self-pay | Admitting: Emergency Medicine

## 2013-12-12 DIAGNOSIS — Z79899 Other long term (current) drug therapy: Secondary | ICD-10-CM | POA: Insufficient documentation

## 2013-12-12 DIAGNOSIS — E119 Type 2 diabetes mellitus without complications: Secondary | ICD-10-CM | POA: Insufficient documentation

## 2013-12-12 DIAGNOSIS — M542 Cervicalgia: Secondary | ICD-10-CM | POA: Insufficient documentation

## 2013-12-12 DIAGNOSIS — E7800 Pure hypercholesterolemia, unspecified: Secondary | ICD-10-CM | POA: Insufficient documentation

## 2013-12-12 MED ORDER — CYCLOBENZAPRINE HCL 5 MG PO TABS: 5.0000 mg | ORAL_TABLET | Freq: Three times a day (TID) | ORAL | Status: DC | PRN

## 2013-12-12 MED ORDER — NAPROXEN 500 MG PO TABS: 500.0000 mg | ORAL_TABLET | Freq: Two times a day (BID) | ORAL | Status: DC

## 2013-12-12 NOTE — ED Notes (Signed)
Pt presents to department for evaluation of bilateral shoulder and neck pain. Ongoing x1 week. Denies recent injury. 8/10 pain, increases with movement. Pt is alert and oriented x4. NAD.

## 2013-12-12 NOTE — ED Provider Notes (Signed)
CSN: [id number]     Arrival date & time 12/12/13  1544 History  This chart was scribed for non-physician practitioner Lucila Maine, PA-C working with Virgel Manifold, MD by Rolanda Lundborg, ED Scribe. This patient was seen in room TR09C/TR09C and the patient's care was started at 4:28 PM.    Chief Complaint  Patient presents with  . Shoulder Pain  . Neck Pain   The history is provided by the patient. No language interpreter was used.   HPI Comments: Tanya Austin is a 43 y.o. female with a PMH of DM and high cholesterol who presents to the Emergency Department complaining of constant aching bilateral neck and shoulder pain, which began gradually 1 week ago.  Pain is located in the back of her neck and shoulders without radiation.  The left side is worse than the right.  Pt denies injury, trauma, or falls. She states prolonged sitting and standing make the pain worse. She has not taken anything for pain.  She reports having similar pain 10 years ago, had x-rays, and was told she had arthritis. She denies numbness and tingling in the fingers, CP, weakness, SOB, fevers, rhinorrhea, ear pain, sore throat, or headache. She has an appointment with her PCP on 1/30.     Past Medical History  Diagnosis Date  . Diabetes mellitus   . High cholesterol    History reviewed. No pertinent past surgical history. No family history on file. History  Substance Use Topics  . Smoking status: Never Smoker   . Smokeless tobacco: Not on file  . Alcohol Use: No   OB History   Grav Para Term Preterm Abortions TAB SAB Ect Mult Living                 Review of Systems  Constitutional: Negative for fever.  HENT: Negative for ear pain and rhinorrhea.   Respiratory: Negative for shortness of breath.   Cardiovascular: Negative for chest pain.  Musculoskeletal: Positive for neck pain.  Neurological: Negative for numbness.  All other systems reviewed and are negative.   Allergies  Review of patient's  allergies indicates no known allergies.  Home Medications   Current Outpatient Rx  Name  Route  Sig  Dispense  Refill  . fish oil-omega-3 fatty acids 1000 MG capsule   Oral   Take 1 g by mouth daily.         Marland Kitchen glyBURIDE-metformin (GLUCOVANCE) 2.5-500 MG per tablet   Oral   Take 1-2 tablets by mouth 2 (two) times daily with a meal. Takes 2 tablets in the morning, and 1 tablet at night.         . Norgestimate-Ethinyl Estradiol Triphasic (TRINESSA, 28,) 0.18/0.215/0.25 MG-35 MCG tablet   Oral   Take 1 tablet by mouth daily.         . pioglitazone (ACTOS) 15 MG tablet   Oral   Take 15 mg by mouth daily.         . simvastatin (ZOCOR) 20 MG tablet   Oral   Take 20 mg by mouth daily.          . vitamin B-12 (CYANOCOBALAMIN) 100 MCG tablet   Oral   Take 50 mcg by mouth daily.          BP 126/65  Pulse 87  Temp(Src) 98 F (36.7 C) (Oral)  Resp 18  Wt 249 lb 6 oz (113.116 kg)  SpO2 99%  Filed Vitals:   12/12/13 1546  BP:  126/65  Pulse: 87  Temp: 98 F (36.7 C)  TempSrc: Oral  Resp: 18  Weight: 249 lb 6 oz (113.116 kg)  SpO2: 99%    Physical Exam  Nursing note and vitals reviewed. Constitutional: She is oriented to person, place, and time. She appears well-developed and well-nourished. No distress.  HENT:  Head: Normocephalic and atraumatic.  Right Ear: External ear normal.  Left Ear: External ear normal.  Nose: Nose normal.  Mouth/Throat: Oropharynx is clear and moist. No oropharyngeal exudate.  Tympanic membranes gray and translucent bilaterally with no erythema, edema, or hemotympanum.  No erythema/exudates to the posterior pharynx.  Uvula midline.  No trismus.    Eyes: Conjunctivae and EOM are normal. Pupils are equal, round, and reactive to light. Right eye exhibits no discharge. Left eye exhibits no discharge.  Neck: Normal range of motion. Neck supple. No tracheal deviation present.  Tenderness to palpation to the posterior paraspinal muscles of  the neck diffusely.  No cervical spinal tenderness.  ROM intact with increased pain throughout.  No palpable masses/edema/LAD.  No lacerations, erythema, or ecchymosis.   Cardiovascular: Normal rate, regular rhythm, normal heart sounds and intact distal pulses.  Exam reveals no gallop and no friction rub.   No murmur heard. Radial pulses present and equal bilaterally  Pulmonary/Chest: Effort normal and breath sounds normal. No respiratory distress. She has no wheezes. She has no rales. She exhibits no tenderness.  Abdominal: Soft. She exhibits no distension. There is no tenderness.  Musculoskeletal: Normal range of motion. She exhibits no edema and no tenderness.       Back:  Tenderness to palpation to the lateral trapezius muscles bilaterally.  Neck and shoulder pain increased with shoulder ROM.  No tenderness to palpation to the shoulder joint or arms bilaterally.  Strength 5/5 in the upper extremities bilaterally.    Neurological: She is alert and oriented to person, place, and time.  Sensation intact  Skin: Skin is warm and dry. She is not diaphoretic.  Psychiatric: She has a normal mood and affect. Her behavior is normal.    ED Course  Procedures (including critical care time) Medications - No data to display  DIAGNOSTIC STUDIES: Oxygen Saturation is 99% on RA, normal by my interpretation.    COORDINATION OF CARE: 6:21 PM- Discussed treatment plan with pt which includes flexeril and naprosyn. Pt agrees to plan.  Labs Review Labs Reviewed - No data to display Imaging Review No results found.  EKG Interpretation   None       MDM   Tanya Austin is a 43 y.o. female with a PMH of DM and high cholesterol who presents to the Emergency Department complaining of constant aching bilateral neck and shoulder pain, which began gradually 1 week ago.  Etiology of neck pain possibly due to a cervical strain.  Patient is a [employer] and has been "reading a lot" which may be exacerbating  her pain.  Patient states she has had similar neck pain in the past with x-rays suggesting arthritis.  She denies any injuries/trauma.  Did not feel x-rays were indicated at this time.  Her pain is more muscular in nature and worse with movement.  Patient afebrile and non-toxic.  No meningeal signs.  Patient neurovascularly intact.  Patient requesting sling immobilizer on the left, which has helped her in the past.  Patient has an appointment with her PCP on 12/29/13 for follow-up.  Return precautions, discharge instructions, and follow-up was discussed with the patient before  discharge.     Discharge Medication List as of 12/12/2013  6:33 PM    START taking these medications   Details  cyclobenzaprine (FLEXERIL) 5 MG tablet Take 1 tablet (5 mg total) by mouth 3 (three) times daily as needed for muscle spasms., Starting 12/12/2013, Until Discontinued, Print    naproxen (NAPROSYN) 500 MG tablet Take 1 tablet (500 mg total) by mouth 2 (two) times daily with a meal., Starting 12/12/2013, Until Discontinued, Print        Final impressions: 1. Neck pain      Denman George   I personally performed the services described in this documentation, which was scribed in my presence. The recorded information has been reviewed and is accurate.        Lucila Maine, PA-C 12/14/13 [phone]

## 2013-12-12 NOTE — Discharge Instructions (Signed)
Take flexeril at night for muscle spasm - Please be careful with this medication.  It can cause drowsiness.  Use caution while driving, operating machinery, drinking alcohol, or any other activities that may impair your physical or mental abilities.   Take Naprosyn twice daily with food  Return to the emergency department if you develop any changing/worsening condition, chest pain, difficulty breathing, weakness, loss of sensation, fever, severe headache, or any other concerns (please read additional information regarding your condition below)    Neck pain  SYMPTOMS   Pain, soreness, stiffness, or a burning sensation in the front, back, or sides of the neck. This discomfort may develop immediately after the injury or slowly, 24 hours or more after the injury.   Pain or tenderness directly in the middle of the back of the neck.   Shoulder or upper back pain.   Limited ability to move the neck.   Headache.   Dizziness.   Weakness, numbness, or tingling in the hands or arms.   Muscle spasms.   Difficulty swallowing or chewing.   Tenderness and swelling of the neck.  DIAGNOSIS  Most of the time your health care provider can diagnose a cervical sprain by taking your history and doing a physical exam. Your health care provider will ask about previous neck injuries and any known neck problems, such as arthritis in the neck. X-rays may be taken to find out if there are any other problems, such as with the bones of the neck. Other tests, such as a CT scan or MRI, may also be needed.  TREATMENT  Treatment depends on the severity of the cervical sprain. Mild sprains can be treated with rest, keeping the neck in place (immobilization), and pain medicines. Severe cervical sprains are immediately immobilized. Further treatment is done to help with pain, muscle spasms, and other symptoms and may include:  Medicines, such as pain relievers, numbing medicines, or muscle relaxants.    Physical therapy. This may involve stretching exercises, strengthening exercises, and posture training. Exercises and improved posture can help stabilize the neck, strengthen muscles, and help stop symptoms from returning.  HOME CARE INSTRUCTIONS   Put ice on the injured area.   Put ice in a plastic bag.   Place a towel between your skin and the bag.   Leave the ice on for 15 20 minutes, 3 4 times a day.   If your injury was severe, you may have been given a cervical collar to wear. A cervical collar is a two-piece collar designed to keep your neck from moving while it heals.  Do not remove the collar unless instructed by your health care provider.  If you have long hair, keep it outside of the collar.  Ask your health care provider before making any adjustments to your collar. Minor adjustments may be required over time to improve comfort and reduce pressure on your chin or on the back of your head.  Ifyou are allowed to remove the collar for cleaning or bathing, follow your health care provider's instructions on how to do so safely.  Keep your collar clean by wiping it with mild soap and water and drying it completely. If the collar you have been given includes removable pads, remove them every 1 2 days and hand wash them with soap and water. Allow them to air dry. They should be completely dry before you wear them in the collar.  If you are allowed to remove the collar for cleaning and bathing, wash  and dry the skin of your neck. Check your skin for irritation or sores. If you see any, tell your health care provider.  Do not drive while wearing the collar.   Only take over-the-counter or prescription medicines for pain, discomfort, or fever as directed by your health care provider.   Keep all follow-up appointments as directed by your health care provider.   Keep all physical therapy appointments as directed by your health care provider.   Make any needed  adjustments to your workstation to promote good posture.   Avoid positions and activities that make your symptoms worse.   Warm up and stretch before being active to help prevent problems.  SEEK MEDICAL CARE IF:   Your pain is not controlled with medicine.   You are unable to decrease your pain medicine over time as planned.   Your activity level is not improving as expected.  SEEK IMMEDIATE MEDICAL CARE IF:   You develop any bleeding.  You develop stomach upset.  You have signs of an allergic reaction to your medicine.   Your symptoms get worse.   You develop new, unexplained symptoms.   You have numbness, tingling, weakness, or paralysis in any part of your body.  MAKE SURE YOU:   Understand these instructions.  Will watch your condition.  Will get help right away if you are not doing well or get worse. Document Released: 09/13/2007 Document Revised: 09/06/2013 Document Reviewed: 05/24/2013 [facility] Patient Information 2014 [place].

## 2013-12-15 NOTE — ED Provider Notes (Signed)
Medical screening examination/treatment/procedure(s) were performed by non-physician practitioner and as supervising physician I was immediately available for consultation/collaboration.  EKG Interpretation   None        Virgel Manifold, MD 12/15/13 1428

## 2014-07-19 ENCOUNTER — Other Ambulatory Visit (HOSPITAL_COMMUNITY): Payer: Self-pay | Admitting: Obstetrics

## 2014-07-19 DIAGNOSIS — D259 Leiomyoma of uterus, unspecified: Secondary | ICD-10-CM

## 2014-07-30 ENCOUNTER — Ambulatory Visit (HOSPITAL_COMMUNITY)
Admission: RE | Admit: 2014-07-30 | Discharge: 2014-07-30 | Disposition: A | Discharge status: Home / Self Care | Payer: Medicaid Other | Source: Ambulatory Visit | Attending: Obstetrics | Admitting: Obstetrics

## 2014-07-30 DIAGNOSIS — D251 Intramural leiomyoma of uterus: Secondary | ICD-10-CM | POA: Diagnosis not present

## 2014-07-30 DIAGNOSIS — D259 Leiomyoma of uterus, unspecified: Secondary | ICD-10-CM

## 2014-07-30 DIAGNOSIS — N854 Malposition of uterus: Secondary | ICD-10-CM | POA: Diagnosis not present

## 2014-12-12 ENCOUNTER — Other Ambulatory Visit (HOSPITAL_COMMUNITY)
Admission: RE | Admit: 2014-12-12 | Discharge: 2014-12-12 | Disposition: A | Discharge status: Home / Self Care | Payer: Medicaid Other | Source: Ambulatory Visit | Attending: Family Medicine | Admitting: Family Medicine

## 2014-12-12 ENCOUNTER — Other Ambulatory Visit: Payer: Medicaid Other | Admitting: Family Medicine

## 2014-12-12 DIAGNOSIS — Z113 Encounter for screening for infections with a predominantly sexual mode of transmission: Secondary | ICD-10-CM | POA: Insufficient documentation

## 2014-12-12 DIAGNOSIS — N760 Acute vaginitis: Secondary | ICD-10-CM | POA: Diagnosis present

## 2014-12-14 LAB — URINE CYTOLOGY ANCILLARY ONLY
CANDIDA VAGINITIS: NEGATIVE
CHLAMYDIA, DNA PROBE: NEGATIVE
Neisseria Gonorrhea: NEGATIVE
TRICH (WINDOWPATH): NEGATIVE

## 2015-05-14 DIAGNOSIS — Z791 Long term (current) use of non-steroidal anti-inflammatories (NSAID): Secondary | ICD-10-CM | POA: Insufficient documentation

## 2015-05-14 DIAGNOSIS — Z79899 Other long term (current) drug therapy: Secondary | ICD-10-CM | POA: Diagnosis not present

## 2015-05-14 DIAGNOSIS — E780 Pure hypercholesterolemia: Secondary | ICD-10-CM | POA: Insufficient documentation

## 2015-05-14 DIAGNOSIS — Z3202 Encounter for pregnancy test, result negative: Secondary | ICD-10-CM | POA: Diagnosis not present

## 2015-05-14 DIAGNOSIS — Z793 Long term (current) use of hormonal contraceptives: Secondary | ICD-10-CM | POA: Insufficient documentation

## 2015-05-14 DIAGNOSIS — N898 Other specified noninflammatory disorders of vagina: Secondary | ICD-10-CM | POA: Diagnosis not present

## 2015-05-14 DIAGNOSIS — E119 Type 2 diabetes mellitus without complications: Secondary | ICD-10-CM | POA: Diagnosis not present

## 2015-05-14 NOTE — ED Notes (Signed)
Pt has had recurrent BV and trich for past year.  Pt has been taking oral and PV meds with no resolution.  Last took meds montbh ago and still having vaginal discharge yellow with odor, itching.

## 2015-05-15 ENCOUNTER — Emergency Department (HOSPITAL_COMMUNITY)
Admission: EM | Admit: 2015-05-15 | Discharge: 2015-05-15 | Disposition: A | Discharge status: Home / Self Care | Payer: Medicaid Other | Attending: Emergency Medicine | Admitting: Emergency Medicine

## 2015-05-15 DIAGNOSIS — N898 Other specified noninflammatory disorders of vagina: Secondary | ICD-10-CM

## 2015-05-15 LAB — URINALYSIS, ROUTINE W REFLEX MICROSCOPIC
BILIRUBIN URINE: NEGATIVE
GLUCOSE, UA: NEGATIVE mg/dL
HGB URINE DIPSTICK: NEGATIVE
Ketones, ur: NEGATIVE mg/dL
Nitrite: NEGATIVE
PH: 5 (ref 5.0–8.0)
Protein, ur: NEGATIVE mg/dL
SPECIFIC GRAVITY, URINE: 1.026 (ref 1.005–1.030)
UROBILINOGEN UA: 1 mg/dL (ref 0.0–1.0)

## 2015-05-15 LAB — WET PREP, GENITAL
TRICH WET PREP: NONE SEEN
Yeast Wet Prep HPF POC: NONE SEEN

## 2015-05-15 LAB — URINE MICROSCOPIC-ADD ON

## 2015-05-15 LAB — GC/CHLAMYDIA PROBE AMP (~~LOC~~) NOT AT ARMC
Chlamydia: NEGATIVE
Neisseria Gonorrhea: NEGATIVE

## 2015-05-15 LAB — POC URINE PREG, ED: PREG TEST UR: NEGATIVE

## 2015-05-15 MED ORDER — ONDANSETRON 4 MG PO TBDP
4.0000 mg | ORAL_TABLET | Freq: Once | ORAL | Status: AC
  Administered 2015-05-15: 4 mg via ORAL
  Filled 2015-05-15: qty 1

## 2015-05-15 NOTE — ED Notes (Signed)
MD at bedside. 

## 2015-05-15 NOTE — ED Notes (Signed)
Pelvic cart setup bedside. 

## 2015-05-15 NOTE — ED Provider Notes (Signed)
CSN: [id number]     Arrival date & time 05/14/15  2217 History  This chart was scribed for Everlene Balls, MD by Delphia Grates, ED Scribe. This patient was seen in room D34C/D34C and the patient's care was started at 12:25 AM.   Chief Complaint  Patient presents with  . Vaginal Discharge    The history is provided by the patient. No language interpreter was used.     HPI Comments: Tanya Austin is a 44 y.o. female who presents to the Emergency Department complaining of recurrent, malodorous vaginal discharge for the past year.  Patient has been evaluated by her PCP and OB/GYN for the same and reports she was has been diagnosed with BV and trichomonas. She notes compliance with prescribed medications, including Metronidazole, but reports her symptoms still persist. There is associated vaginal itching, nausea, and diarrhea. She also reports abnormal vaginal bleeding that is unrelated to her menstrual periods that has been ongoing for the past year. Patient denies unprotected sexual intercourse or history of Gonorrhea or Chlamydia. She denies fever, chills, or urinary symptoms.   Past Medical History  Diagnosis Date  . Diabetes mellitus   . High cholesterol    No past surgical history on file. No family history on file. History  Substance Use Topics  . Smoking status: Never Smoker   . Smokeless tobacco: Not on file  . Alcohol Use: No   OB History    No data available     Review of Systems  A complete 10 system review of systems was obtained and all systems are negative except as noted in the HPI and PMH.   Allergies  Review of patient's allergies indicates no known allergies.  Home Medications   Prior to Admission medications   Medication Sig Start Date End Date Taking? Authorizing Provider  acetaminophen (TYLENOL) 500 MG tablet Take 1,000 mg by mouth 2 (two) times daily as needed for moderate pain.    Historical Provider, MD  cyclobenzaprine (FLEXERIL) 5 MG tablet Take 1  tablet (5 mg total) by mouth 3 (three) times daily as needed for muscle spasms. 12/12/13   Lucila Maine, PA-C  fish oil-omega-3 fatty acids 1000 MG capsule Take 1 g by mouth daily.    Historical Provider, MD  IRON PO Take 1 tablet by mouth daily.    Historical Provider, MD  naproxen (NAPROSYN) 500 MG tablet Take 1 tablet (500 mg total) by mouth 2 (two) times daily with a meal. 12/12/13   Lucila Maine, PA-C  Norgestimate-Ethinyl Estradiol Triphasic (TRINESSA, 28,) 0.18/0.215/0.25 MG-35 MCG tablet Take 1 tablet by mouth daily.    Historical Provider, MD  pioglitazone (ACTOS) 15 MG tablet Take 15 mg by mouth daily.    Historical Provider, MD  simvastatin (ZOCOR) 20 MG tablet Take 20 mg by mouth at bedtime.     Historical Provider, MD  sitaGLIPtin-metformin (JANUMET) 50-1000 MG per tablet Take 1 tablet by mouth 2 (two) times daily with a meal.    Historical Provider, MD  vitamin B-12 (CYANOCOBALAMIN) 100 MCG tablet Take 50 mcg by mouth daily.    Historical Provider, MD   Triage Vitals: BP 146/70 mmHg  Pulse 78  Temp(Src) 98.1 F (36.7 C) (Oral)  Resp 20  Ht 5\' 7"  (1.702 m)  Wt 252 lb 8 oz (114.533 kg)  BMI 39.54 kg/m2  SpO2 98%  Physical Exam  Constitutional: She is oriented to person, place, and time. She appears well-developed and well-nourished. No distress.  HENT:  Head: Normocephalic and atraumatic.  Nose: Nose normal.  Mouth/Throat: Oropharynx is clear and moist. No oropharyngeal exudate.  Eyes: Conjunctivae and EOM are normal. Pupils are equal, round, and reactive to light. No scleral icterus.  Neck: Normal range of motion. Neck supple. No JVD present. No tracheal deviation present. No thyromegaly present.  Cardiovascular: Normal rate, regular rhythm and normal heart sounds.  Exam reveals no gallop and no friction rub.   No murmur heard. Pulmonary/Chest: Effort normal and breath sounds normal. No respiratory distress. She has no wheezes. She exhibits no tenderness.  Abdominal:  Soft. Bowel sounds are normal. She exhibits no distension and no mass. There is no tenderness. There is no rebound and no guarding.  Musculoskeletal: Normal range of motion. She exhibits no edema or tenderness.  Lymphadenopathy:    She has no cervical adenopathy.  Neurological: She is alert and oriented to person, place, and time. No cranial nerve deficit. She exhibits normal muscle tone.  Skin: Skin is warm and dry. No rash noted. No erythema. No pallor.  Nursing note and vitals reviewed.   ED Course  Procedures (including critical care time)  DIAGNOSTIC STUDIES: Oxygen Saturation is 98% on room air, normal by my interpretation.    COORDINATION OF CARE: At 0034 Discussed treatment plan with patient which includes a pelvic exam. Patient agrees.   Labs Review Labs Reviewed  WET PREP, GENITAL - Abnormal; Notable for the following:    Clue Cells Wet Prep HPF POC FEW (*)    WBC, Wet Prep HPF POC TOO NUMEROUS TO COUNT (*)    All other components within normal limits  URINALYSIS, ROUTINE W REFLEX MICROSCOPIC (NOT AT [facility]) - Abnormal; Notable for the following:    Color, Urine AMBER (*)    APPearance CLOUDY (*)    Leukocytes, UA LARGE (*)    All other components within normal limits  URINE MICROSCOPIC-ADD ON - Abnormal; Notable for the following:    Squamous Epithelial / LPF MANY (*)    Bacteria, UA MANY (*)    All other components within normal limits  POC URINE PREG, ED  GC/CHLAMYDIA PROBE AMP ([place]) NOT AT [facility]    Imaging Review No results found.   EKG Interpretation None      MDM   Final diagnoses:  None   Patient since emergency department for recurrent vaginal discharge and foul odor. She's been treated several times over the past year for BV and Trichomonas. She states each time it recurs within a week. She has no abdominal pain no systemic symptoms, no fevers.  Will obtain pregnancy test, pelvic exam for disposition.  Pelvic exam shows possible mass on  cervix with easy bleeding. Normal physiologic discharge. Will not treat for infection. She is advised to see gynecology oncologist for close follow-up. She demonstrated understanding. She otherwise appears well-developed acute distress. Her vital signs remain within her normal limits and she is safe for discharge.  I personally performed the services described in this documentation, which was scribed in my presence. The recorded information has been reviewed and is accurate.   Everlene Balls, MD 05/15/15 [phone]

## 2015-05-15 NOTE — Discharge Instructions (Signed)
Abnormal Uterine Bleeding Ms. Klahr, you were seen today for discharge and bleeding. See a gynecology oncologist for further evaluation. We did not find BV or Trichomonas today. If any symptoms worsen come back to the emergency department immediately. Thank you. Abnormal uterine bleeding means bleeding from the vagina that is not your normal menstrual period. This can be:  Bleeding or spotting between periods.  Bleeding after sex (sexual intercourse).  Bleeding that is heavier or more than normal.  Periods that last longer than usual.  Bleeding after menopause. There are many problems that may cause this. Treatment will depend on the cause of the bleeding. Any kind of bleeding that is not normal should be reviewed by your doctor.  HOME CARE Watch your condition for any changes. These actions may lessen any discomfort you are having:  Do not use tampons or douches as told by your doctor.  Change your pads often. You should get regular pelvic exams and Pap tests. Keep all appointments for tests as told by your doctor. GET HELP IF:  You are bleeding for more than 1 week.  You feel dizzy at times. GET HELP RIGHT AWAY IF:   You pass out.  You have to change pads every 15 to 30 minutes.  You have belly pain.  You have a fever.  You become sweaty or weak.  You are passing large blood clots from the vagina.  You feel sick to your stomach (nauseous) and throw up (vomit). MAKE SURE YOU:  Understand these instructions.  Will watch your condition.  Will get help right away if you are not doing well or get worse. Document Released: 09/13/2009 Document Revised: 11/21/2013 Document Reviewed: 06/15/2013 [facility] Patient Information 2015 [employer], [place]. This information is not intended to replace advice given to you by your health care provider. Make sure you discuss any questions you have with your health care provider.

## 2015-05-27 ENCOUNTER — Ambulatory Visit: Payer: Medicaid Other | Attending: Gynecologic Oncology | Admitting: Gynecologic Oncology

## 2015-05-27 ENCOUNTER — Encounter: Payer: Self-pay | Admitting: Gynecologic Oncology

## 2015-05-27 DIAGNOSIS — N898 Other specified noninflammatory disorders of vagina: Secondary | ICD-10-CM | POA: Insufficient documentation

## 2015-05-27 DIAGNOSIS — N86 Erosion and ectropion of cervix uteri: Secondary | ICD-10-CM | POA: Diagnosis not present

## 2015-05-27 NOTE — Patient Instructions (Signed)
We will contact you with the results of your biopsy from today.  Please call for any questions or concerns.  You may have vaginal spotting after the biopsy.

## 2015-05-27 NOTE — Addendum Note (Signed)
Addended by: Joylene John D on: 05/27/2015 04:50 PM   Modules accepted: Orders

## 2015-05-27 NOTE — Progress Notes (Signed)
Consult Note: Gyn-Onc  Consult was requested by Dr. Claudine Austin from the [facility] ER for the evaluation of Tanya Austin 44 y.o. female  CC: Vaginal discharge and spotting, cervical mass  Assessment/Plan:  [name]  is a 44 y.o.  year old with a persistent vaginal discharge and chronic trichomonas infection. She has a friable ectropion on her cervix.  I discussed with Tanya Austin at that my suspicion is low that this represents a cervical Austin. We will follow-up the results of today's cervical biopsy. If malignancy is identified up he is to be a stage IB 1 and she would be amenable to radical hysterectomy. However, if the results of this biopsy are more consistent with cervical inflammation she should follow-up with Dr. Ruthann Austin for ongoing care of her chronic cervicitis.  HPI: Tanya Austin is a 45 year old gravida 4 para 4 who is seen in consultation at the request of Dr. Claudine Austin for a cervical mass. The patient has a history of bleeding and discharge for "not sure how long". Her predominant symptom is vaginal discharge. She reports that vaginal bleeding occurs in between Tanya Austin. Occasionally, and she has postcoital bleeding. She is unable to define the time. For which she is habitus. She has seen her OB/GYN Dr. Ruthann Austin 2 weeks ago at which time a pelvic examination was performed and a diagnosis of Trichomonas is made. She has had be been Trichomonas diagnosis multiple occasions. She was prescribed metronidazole. She reports no prior history of abnormal Pap smears. She reports having regular cytologic assessment over the course of the past 20 years.  On 05/15/2015 she presents the [facility] ER with symptoms of vaginal discharge. She was evaluated by Dr. Claudine Austin and was felt to have a cervical mass that was friable on examination.   Current Meds:  Outpatient Encounter Prescriptions as of 05/27/2015  Medication Sig  . acetaminophen (TYLENOL) 500 MG tablet Take 1,000 mg by mouth 2 (two) times  daily as needed for moderate pain.  . B-D UF III MINI PEN NEEDLES 31G X 5 MM MISC USE UP TO BID FOR INJECTION OF INSULIN  . fish oil-omega-3 fatty acids 1000 MG capsule Take 1 g by mouth daily.  Marland Kitchen glyBURIDE-metformin (GLUCOVANCE) 5-500 MG per tablet Take by mouth.  . Insulin Glargine (TOUJEO SOLOSTAR) 300 UNIT/ML SOPN Inject 14 Units into the skin at bedtime.  . IRON PO Take 1 tablet by mouth daily.  . metroNIDAZOLE (METROGEL) 0.75 % vaginal gel INSERT 1 APPLICATORFUL VAGINALLY QHS FOR 5 DAYS  . Norgestimate-Ethinyl Estradiol Triphasic (TRINESSA, 28,) 0.18/0.215/0.25 MG-35 MCG tablet Take 1 tablet by mouth daily.  . pioglitazone (ACTOS) 15 MG tablet Take 15 mg by mouth daily.  . simvastatin (ZOCOR) 20 MG tablet Take 20 mg by mouth at bedtime.   . sitaGLIPtin-metformin (JANUMET) 50-1000 MG per tablet Take 1 tablet by mouth 2 (two) times daily with a meal.   No facility-administered encounter medications on file as of 05/27/2015.    Allergy: No Known Allergies  Social Hx:   History   Social History  . Marital Status: Single    Spouse Name: N/A  . Number of Children: N/A  . Years of Education: N/A   Occupational History  . Not on file.   Social History Main Topics  . Smoking status: Never Smoker   . Smokeless tobacco: Not on file  . Alcohol Use: No     Comment: Socially on weekends  . Drug Use: No  . Sexual Activity: Yes  Other Topics Concern  . Not on file   Social History Narrative    Past Surgical Hx: History reviewed. No pertinent past surgical history.  Past Medical Hx:  Past Medical History  Diagnosis Date  . Diabetes mellitus   . High cholesterol     Past Gynecological History:  SVD x 4, no prior abnormal paps  No LMP recorded.  Family Hx: History reviewed. No pertinent family history.  Review of Systems:  Constitutional  Feels well,    ENT Normal appearing ears and nares bilaterally Skin/Breast  No rash, sores, jaundice, itching,  dryness Cardiovascular  No chest pain, shortness of breath, or edema  Pulmonary  No cough or wheeze.  Gastro Intestinal  No nausea, vomitting, or diarrhoea. No bright red blood per rectum, no abdominal pain, change in bowel movement, or constipation.  Genito Urinary  No frequency, urgency, dysuria, Musculo Skeletal  No myalgia, arthralgia, joint swelling or pain  Neurologic  No weakness, numbness, change in gait,  Psychology  No depression, anxiety, insomnia.   Vitals:  There were no vitals taken for this visit.  Physical Exam: WD in NAD Neck  Supple NROM, without any enlargements.  Lymph Node Survey No cervical supraclavicular or inguinal adenopathy Cardiovascular  Pulse normal rate, regularity and rhythm. S1 and S2 normal.  Lungs  Clear to auscultation bilateraly, without wheezes/crackles/rhonchi. Good air movement.  Skin  No rash/lesions/breakdown  Psychiatry  Alert and oriented to person, place, and time  Abdomen  Normoactive bowel sounds, abdomen soft, non-tender and obese without evidence of hernia. Back No CVA tenderness Genito Urinary  Vulva/vagina: Normal external female genitalia.  No lesions. No discharge or bleeding.  Bladder/urethra:  No lesions or masses, well supported bladder  Vagina: normal in appearance, no lesions  Cervix: Grossly normal, large, friable ectropion on posterior lip. Biopsed (see below). Soft to palpate. No parametrial induration.  Uterus: Small, mobile, no parametrial involvement or nodularity.  Adnexa: no palpable masses. Rectal  Good tone, no masses no cul de sac nodularity.  Extremities  No bilateral cyanosis, clubbing or edema.  Procedure Note: Cervical Biopsy Verbal consent was obtained from the patient. It Tischler forcep was used to obtain a single grasp of tissue from the posterior lip of the cervix. Hemostasis was achieved with silver nitrate. The patient tolerated procedure well. Blood loss was <10cc  Tanya Eva,  MD   05/27/2015, 4:27 PM

## 2015-06-06 ENCOUNTER — Telehealth: Payer: Self-pay | Admitting: Gynecologic Oncology

## 2015-06-06 NOTE — Telephone Encounter (Signed)
Informed patient of results of negative biopsy for malignancy. 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 Recommend continuing care with her OBGYN for chronic vaginitis.

## 2015-06-10 ENCOUNTER — Inpatient Hospital Stay (HOSPITAL_COMMUNITY)
Admission: EM | Admit: 2015-06-10 | Discharge: 2015-06-10 | Disposition: A | Discharge status: Home / Self Care | Payer: Medicaid Other | Source: Ambulatory Visit | Attending: Obstetrics & Gynecology | Admitting: Obstetrics & Gynecology

## 2015-06-10 ENCOUNTER — Encounter (HOSPITAL_COMMUNITY): Payer: Self-pay | Admitting: *Deleted

## 2015-06-10 DIAGNOSIS — N898 Other specified noninflammatory disorders of vagina: Secondary | ICD-10-CM | POA: Diagnosis present

## 2015-06-10 DIAGNOSIS — B9689 Other specified bacterial agents as the cause of diseases classified elsewhere: Secondary | ICD-10-CM | POA: Insufficient documentation

## 2015-06-10 DIAGNOSIS — D259 Leiomyoma of uterus, unspecified: Secondary | ICD-10-CM

## 2015-06-10 DIAGNOSIS — Z87891 Personal history of nicotine dependence: Secondary | ICD-10-CM | POA: Insufficient documentation

## 2015-06-10 DIAGNOSIS — N760 Acute vaginitis: Secondary | ICD-10-CM | POA: Diagnosis not present

## 2015-06-10 LAB — URINE MICROSCOPIC-ADD ON

## 2015-06-10 LAB — URINALYSIS, ROUTINE W REFLEX MICROSCOPIC
Bilirubin Urine: NEGATIVE
Hgb urine dipstick: NEGATIVE
KETONES UR: NEGATIVE mg/dL
NITRITE: NEGATIVE
PROTEIN: NEGATIVE mg/dL
Urobilinogen, UA: 1 mg/dL (ref 0.0–1.0)
pH: 5.5 (ref 5.0–8.0)

## 2015-06-10 LAB — WET PREP, GENITAL
TRICH WET PREP: NONE SEEN
YEAST WET PREP: NONE SEEN

## 2015-06-10 LAB — POCT PREGNANCY, URINE: PREG TEST UR: NEGATIVE

## 2015-06-10 MED ORDER — MINOCYCLINE HCL 100 MG PO CAPS: 100.0000 mg | ORAL_CAPSULE | Freq: Two times a day (BID) | ORAL | Status: DC

## 2015-06-10 NOTE — MAU Note (Signed)
Vaginal discharge for several months, had mass on cervix - dx'd @ [facility].  Bx was negative.  Discharge is white/yellow, has odor & itching.  Finished course of flagyl which did not clear it up.  Also has bleeding between her periods & after intercourse, is not bleeding today.   Has previously been seen by Dr. Ruthann Cancer, does not want to continue seeing him.

## 2015-06-10 NOTE — Discharge Instructions (Signed)
Bacterial Vaginosis Bacterial vaginosis is a vaginal infection that occurs when the normal balance of bacteria in the vagina is disrupted. It results from an overgrowth of certain bacteria. This is the most common vaginal infection in women of childbearing age. Treatment is important to prevent complications, especially in pregnant women, as it can cause a premature delivery. CAUSES  Bacterial vaginosis is caused by an increase in harmful bacteria that are normally present in smaller amounts in the vagina. Several different kinds of bacteria can cause bacterial vaginosis. However, the reason that the condition develops is not fully understood. RISK FACTORS Certain activities or behaviors can put you at an increased risk of developing bacterial vaginosis, including:  Having a new sex partner or multiple sex partners.  Douching.  Using an intrauterine device (IUD) for contraception. Women do not get bacterial vaginosis from toilet seats, bedding, swimming pools, or contact with objects around them. SIGNS AND SYMPTOMS  Some women with bacterial vaginosis have no signs or symptoms. Common symptoms include:  Grey vaginal discharge.  A fishlike odor with discharge, especially after sexual intercourse.  Itching or burning of the vagina and vulva.  Burning or pain with urination. DIAGNOSIS  Your health care provider will take a medical history and examine the vagina for signs of bacterial vaginosis. A sample of vaginal fluid may be taken. Your health care provider will look at this sample under a microscope to check for bacteria and abnormal cells. A vaginal pH test may also be done.  TREATMENT  Bacterial vaginosis may be treated with antibiotic medicines. These may be given in the form of a pill or a vaginal cream. A second round of antibiotics may be prescribed if the condition comes back after treatment.  HOME CARE INSTRUCTIONS   Only take over-the-counter or prescription medicines as  directed by your health care provider.  If antibiotic medicine was prescribed, take it as directed. Make sure you finish it even if you start to feel better.  Do not have sex until treatment is completed.  Tell all sexual partners that you have a vaginal infection. They should see their health care provider and be treated if they have problems, such as a mild rash or itching.  Practice safe sex by using condoms and only having one sex partner. SEEK MEDICAL CARE IF:   Your symptoms are not improving after 3 days of treatment.  You have increased discharge or pain.  You have a fever. MAKE SURE YOU:   Understand these instructions.  Will watch your condition.  Will get help right away if you are not doing well or get worse. FOR MORE INFORMATION  Centers for Disease Control and Prevention, Division of STD Prevention: www.cdc.gov/std American Sexual Health Association (ASHA): www.ashastd.org  Document Released: 11/16/2005 Document Revised: 09/06/2013 Document Reviewed: 06/28/2013 [employer] Patient Information 2015 [employer]. This information is not intended to replace advice given to you by your health care provider. Make sure you discuss any questions you have with your health care provider.  

## 2015-06-10 NOTE — MAU Provider Note (Signed)
History    44 yo female in with c/o vaginal infection she cant get rid of. States been going on for a year. Also has hx of sm uterine fibroids and DUB. States Dr. Ruthann Cancer told her they were small. CSN: [id number]  Arrival date and time: 06/10/15 1422   None     Chief Complaint  Patient presents with  . Vaginal Discharge   HPI  OB History    Gravida Para Term Preterm AB TAB SAB Ectopic Multiple Living   5 5 4 1      4       Past Medical History  Diagnosis Date  . Diabetes mellitus   . High cholesterol     Past Surgical History  Procedure Laterality Date  . Cervical biopsy      History reviewed. No pertinent family history.  History  Substance Use Topics  . Smoking status: Former Smoker    Types: Cigarettes    Quit date: 06/09/2000  . Smokeless tobacco: Not on file  . Alcohol Use: Yes     Comment: Socially on weekends    Allergies: No Known Allergies  Prescriptions prior to admission  Medication Sig Dispense Refill Last Dose  . acetaminophen (TYLENOL) 500 MG tablet Take 1,000 mg by mouth 2 (two) times daily as needed for moderate pain.   Past Month at Unknown time  . B-D UF III MINI PEN NEEDLES 31G X 5 MM MISC USE UP TO BID FOR INJECTION OF INSULIN  3   . fish oil-omega-3 fatty acids 1000 MG capsule Take 1 g by mouth daily.   05/13/2015 at Unknown time  . glyBURIDE-metformin (GLUCOVANCE) 5-500 MG per tablet Take by mouth.     . Insulin Glargine (TOUJEO SOLOSTAR) 300 UNIT/ML SOPN Inject 14 Units into the skin at bedtime.   05/14/2015 at Unknown time  . IRON PO Take 1 tablet by mouth daily.   05/13/2015  . metroNIDAZOLE (METROGEL) 0.75 % vaginal gel INSERT 1 APPLICATORFUL VAGINALLY QHS FOR 5 DAYS  0   . Norgestimate-Ethinyl Estradiol Triphasic (TRINESSA, 28,) 0.18/0.215/0.25 MG-35 MCG tablet Take 1 tablet by mouth daily.   05/14/2015 at Unknown time  . pioglitazone (ACTOS) 15 MG tablet Take 15 mg by mouth daily.   05/14/2015 at Unknown time  . simvastatin (ZOCOR) 20  MG tablet Take 20 mg by mouth at bedtime.    05/14/2015 at Unknown time  . sitaGLIPtin-metformin (JANUMET) 50-1000 MG per tablet Take 1 tablet by mouth 2 (two) times daily with a meal.   05/14/2015 at Unknown time    Review of Systems  HENT: Negative.   Eyes: Negative.   Respiratory: Negative.   Cardiovascular: Negative.   Genitourinary:       Yellowish vag discharge with foul odor  Musculoskeletal: Negative.   Skin: Negative.   Neurological: Negative.   Endo/Heme/Allergies: Negative.   Psychiatric/Behavioral: Negative.    Physical Exam   Blood pressure 135/70, pulse 75, temperature 98.2 F (36.8 C), temperature source Oral, resp. rate 18, last menstrual period 05/06/2015.  Physical Exam  Constitutional: She is oriented to person, place, and time. She appears well-developed and well-nourished.  HENT:  Head: Normocephalic.  Eyes: Pupils are equal, round, and reactive to light.  Neck: Normal range of motion.  Cardiovascular: Normal rate, regular rhythm, normal heart sounds and intact distal pulses.   Respiratory: Effort normal and breath sounds normal.  GI: Soft. Bowel sounds are normal.  Genitourinary: Uterus normal.  Yellowish discharge with ammine odor.  Musculoskeletal:  Normal range of motion.  Neurological: She is alert and oriented to person, place, and time. She has normal reflexes.  Skin: Skin is warm and dry.  Psychiatric: She has a normal mood and affect. Her behavior is normal. Judgment and thought content normal.    MAU Course  Procedures  MDM Uterine fibroids and vag discharge, BV  Assessment and Plan  Wet prep, chla,gc, BV  LAWSON, MARIE DARLENE 06/10/2015, 4:33 PM

## 2015-06-10 NOTE — MAU Note (Signed)
Pt reports having over 10 treatment of flagyl over the past year. Pt finsihed most recent round of flagyl 1-2 months ago.  Pt still has vag itching and odor and is unsure why.  She plans to [facility] to [facility] care.

## 2015-06-11 LAB — GC/CHLAMYDIA PROBE AMP (~~LOC~~) NOT AT ARMC
Chlamydia: NEGATIVE
Neisseria Gonorrhea: NEGATIVE

## 2015-06-11 LAB — HIV ANTIBODY (ROUTINE TESTING W REFLEX): HIV Screen 4th Generation wRfx: NONREACTIVE

## 2015-06-11 LAB — RPR: RPR Ser Ql: NONREACTIVE

## 2015-07-05 ENCOUNTER — Inpatient Hospital Stay (HOSPITAL_COMMUNITY)
Admission: EM | Admit: 2015-07-05 | Discharge: 2015-07-05 | Disposition: A | Discharge status: Home / Self Care | Payer: Medicaid Other | Source: Ambulatory Visit | Attending: Obstetrics & Gynecology | Admitting: Obstetrics & Gynecology

## 2015-07-05 ENCOUNTER — Encounter (HOSPITAL_COMMUNITY): Payer: Self-pay | Admitting: *Deleted

## 2015-07-05 DIAGNOSIS — N898 Other specified noninflammatory disorders of vagina: Secondary | ICD-10-CM | POA: Diagnosis present

## 2015-07-05 DIAGNOSIS — Z87891 Personal history of nicotine dependence: Secondary | ICD-10-CM | POA: Insufficient documentation

## 2015-07-05 DIAGNOSIS — A499 Bacterial infection, unspecified: Secondary | ICD-10-CM

## 2015-07-05 DIAGNOSIS — B9689 Other specified bacterial agents as the cause of diseases classified elsewhere: Secondary | ICD-10-CM

## 2015-07-05 DIAGNOSIS — N760 Acute vaginitis: Secondary | ICD-10-CM | POA: Diagnosis not present

## 2015-07-05 LAB — URINALYSIS, ROUTINE W REFLEX MICROSCOPIC
BILIRUBIN URINE: NEGATIVE
Glucose, UA: NEGATIVE mg/dL
Hgb urine dipstick: NEGATIVE
KETONES UR: NEGATIVE mg/dL
LEUKOCYTES UA: NEGATIVE
NITRITE: NEGATIVE
Protein, ur: NEGATIVE mg/dL
Specific Gravity, Urine: >1.03 — ABNORMAL HIGH (ref 1.005–1.030)
Urobilinogen, UA: 0.2 mg/dL (ref 0.0–1.0)
pH: 5.5 (ref 5.0–8.0)

## 2015-07-05 LAB — WET PREP, GENITAL
Trich, Wet Prep: NONE SEEN
Yeast Wet Prep HPF POC: NONE SEEN

## 2015-07-05 LAB — POCT PREGNANCY, URINE: PREG TEST UR: NEGATIVE

## 2015-07-05 MED ORDER — 4X PROBIOTIC PO TABS: 1.0000 | ORAL_TABLET | Freq: Every day | ORAL | Status: DC

## 2015-07-05 MED ORDER — CLINDAMYCIN PHOSPHATE 2 % VA CREA: 1.0000 | TOPICAL_CREAM | Freq: Every day | VAGINAL | Status: DC

## 2015-07-05 NOTE — MAU Note (Signed)
Pt presents to MAU with complaints of a brown vaginal discharge. Reports lower abdominal pain

## 2015-07-05 NOTE — Discharge Instructions (Signed)
Bacterial Vaginosis Bacterial vaginosis is a vaginal infection that occurs when the normal balance of bacteria in the vagina is disrupted. It results from an overgrowth of certain bacteria. This is the most common vaginal infection in women of childbearing age. Treatment is important to prevent complications, especially in pregnant women, as it can cause a premature delivery. CAUSES  Bacterial vaginosis is caused by an increase in harmful bacteria that are normally present in smaller amounts in the vagina. Several different kinds of bacteria can cause bacterial vaginosis. However, the reason that the condition develops is not fully understood. RISK FACTORS Certain activities or behaviors can put you at an increased risk of developing bacterial vaginosis, including:  Having a new sex partner or multiple sex partners.  Douching.  Using an intrauterine device (IUD) for contraception. Women do not get bacterial vaginosis from toilet seats, bedding, swimming pools, or contact with objects around them. SIGNS AND SYMPTOMS  Some women with bacterial vaginosis have no signs or symptoms. Common symptoms include:  Grey vaginal discharge.  A fishlike odor with discharge, especially after sexual intercourse.  Itching or burning of the vagina and vulva.  Burning or pain with urination. DIAGNOSIS  Your health care provider will take a medical history and examine the vagina for signs of bacterial vaginosis. A sample of vaginal fluid may be taken. Your health care provider will look at this sample under a microscope to check for bacteria and abnormal cells. A vaginal pH test may also be done.  TREATMENT  Bacterial vaginosis may be treated with antibiotic medicines. These may be given in the form of a pill or a vaginal cream. A second round of antibiotics may be prescribed if the condition comes back after treatment.  HOME CARE INSTRUCTIONS   Only take over-the-counter or prescription medicines as  directed by your health care provider.  If antibiotic medicine was prescribed, take it as directed. Make sure you finish it even if you start to feel better.  Do not have sex until treatment is completed.  Tell all sexual partners that you have a vaginal infection. They should see their health care provider and be treated if they have problems, such as a mild rash or itching.  Practice safe sex by using condoms and only having one sex partner. SEEK MEDICAL CARE IF:   Your symptoms are not improving after 3 days of treatment.  You have increased discharge or pain.  You have a fever. MAKE SURE YOU:   Understand these instructions.  Will watch your condition.  Will get help right away if you are not doing well or get worse. FOR MORE INFORMATION  Centers for Disease Control and Prevention, Division of STD Prevention: www.cdc.gov/std American Sexual Health Association (ASHA): www.ashastd.org  Document Released: 11/16/2005 Document Revised: 09/06/2013 Document Reviewed: 06/28/2013 [employer] Patient Information 2015 [employer]. This information is not intended to replace advice given to you by your health care provider. Make sure you discuss any questions you have with your health care provider.  

## 2015-07-05 NOTE — MAU Provider Note (Signed)
History     CSN: [id number]  Arrival date and time: 07/05/15 1143   None     Chief Complaint  Patient presents with  . Vaginal Discharge   This is a 44 y.o. female who presents with c/o vaginal discharge with odor.  States it was brown the other day and now is white/ tan. Just finished her period a few days ago.  Has some intermittent lower abdominal pain. Has had multiple episodes of trichomonas and BV, treated with Flagyl. States is not concerned about STD testing. Feels sure this is BV again. 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  She has postcoital spotting and this was believed to be the source.   Vaginal Discharge The patient's primary symptoms include a genital odor and vaginal discharge. The patient's pertinent negatives include no genital itching, genital lesions, genital rash or missed menses. This is a recurrent problem. The current episode started in the past 7 days. The problem occurs constantly. The problem has been unchanged. The pain is mild. The problem affects both sides. She is not pregnant. Associated symptoms include abdominal pain (mild lower abdominal pain, comes and goes). Pertinent negatives include no back pain, chills, constipation, diarrhea, fever, flank pain, hematuria, nausea, painful intercourse or vomiting. The vaginal discharge was brown and milky. There has been no bleeding. She has not been passing clots. She has not been passing tissue. Nothing aggravates the symptoms. She has tried nothing for the symptoms. She is sexually active. Her past medical history is significant for an STD and vaginosis.  RN Note:  Expand All Collapse All   Pt presents to MAU with complaints of a brown vaginal discharge. Reports lower abdominal pain           Expand All Collapse All   Vaginal discharge for several months, had mass on cervix - dx'd @ [facility]. Bx was negative. Discharge is white/yellow, has odor &  itching. Finished course of flagyl which did not clear it up. Also has bleeding between her periods & after intercourse, is not bleeding today. Has previously been seen by Dr. Ruthann Cancer, does not want to continue seeing him.           OB History    Gravida Para Term Preterm AB TAB SAB Ectopic Multiple Living   5 5 4 1      4       Past Medical History  Diagnosis Date  . Diabetes mellitus   . High cholesterol     Past Surgical History  Procedure Laterality Date  . Cervical biopsy      History reviewed. No pertinent family history.  History  Substance Use Topics  . Smoking status: Former Smoker    Types: Cigarettes    Quit date: 06/09/2000  . Smokeless tobacco: Not on file  . Alcohol Use: Yes     Comment: Socially on weekends    Allergies: No Known Allergies  Prescriptions prior to admission  Medication Sig Dispense Refill Last Dose  . acetaminophen (TYLENOL) 500 MG tablet Take 1,000 mg by mouth 2 (two) times daily as needed for moderate pain.   Past Week at Unknown time  . B-D UF III MINI PEN NEEDLES 31G X 5 MM MISC USE UP TO BID FOR INJECTION OF INSULIN  3   . fish oil-omega-3 fatty acids 1000 MG capsule Take 1 g by mouth daily.   Past Week at Unknown time  .  ibuprofen (ADVIL,MOTRIN) 200 MG tablet Take 400 mg by mouth every 6 (six) hours as needed.   Past Week at Unknown time  . Insulin Glargine (TOUJEO SOLOSTAR) 300 UNIT/ML SOPN Inject 14 Units into the skin at bedtime.   Past Week at Unknown time  . IRON PO Take 1 tablet by mouth daily.   Past Week at Unknown time  . minocycline (MINOCIN) 100 MG capsule Take 1 capsule (100 mg total) by mouth 2 (two) times daily. 20 capsule 0   . Norgestimate-Ethinyl Estradiol Triphasic (TRINESSA, 28,) 0.18/0.215/0.25 MG-35 MCG tablet Take 1 tablet by mouth daily.   Past Week at Unknown time  . pioglitazone (ACTOS) 15 MG tablet Take 15 mg by mouth daily.   Past Week at Unknown time  . simvastatin (ZOCOR) 20 MG tablet Take 20 mg  by mouth at bedtime.    Past Week at Unknown time  . sitaGLIPtin-metformin (JANUMET) 50-1000 MG per tablet Take 1 tablet by mouth 2 (two) times daily with a meal.   Past Week at Unknown time   Medical, Surgical, Family and Social histories reviewed and are listed above.  Medications and allergies reviewed.  Review of Systems  Constitutional: Negative for fever, chills and malaise/fatigue.  Gastrointestinal: Positive for abdominal pain (mild lower abdominal pain, comes and goes). Negative for nausea, vomiting, diarrhea and constipation.  Genitourinary: Positive for vaginal discharge. Negative for hematuria, flank pain and missed menses.  Musculoskeletal: Negative for back pain.  Other systems negative  Physical Exam   Blood pressure 127/70, pulse 85, temperature 98.2 F (36.8 C), resp. rate 18, last menstrual period 06/27/2015.  Physical Exam  Constitutional: She is oriented to person, place, and time. She appears well-developed and well-nourished. No distress.  HENT:  Head: Normocephalic.  Cardiovascular: Normal rate and regular rhythm.   Respiratory: Effort normal. No respiratory distress.  GI: Soft. She exhibits no distension and no mass. There is no tenderness (no focal tenderness). There is no rebound and no guarding.  Genitourinary: Vaginal discharge (copious milky white discharge) found.  Cervix closed No CMT Uterus not easily palpable due to habitus Adnexae not palpable  Musculoskeletal: Normal range of motion.  Neurological: She is alert and oriented to person, place, and time.  Skin: Skin is warm and dry.  Psychiatric: She has a normal mood and affect.    MAU Course  Procedures  MDM Results for orders placed or performed during the hospital encounter of 07/05/15 (from the past 24 hour(s))  Urinalysis, Routine w reflex microscopic (not at [facility])     Status: Abnormal   Collection Time: 07/05/15 12:20 PM  Result Value Ref Range   Color, Urine YELLOW YELLOW    APPearance CLEAR CLEAR   Specific Gravity, Urine >1.030 (H) 1.005 - 1.030   pH 5.5 5.0 - 8.0   Glucose, UA NEGATIVE NEGATIVE mg/dL   Hgb urine dipstick NEGATIVE NEGATIVE   Bilirubin Urine NEGATIVE NEGATIVE   Ketones, ur NEGATIVE NEGATIVE mg/dL   Protein, ur NEGATIVE NEGATIVE mg/dL   Urobilinogen, UA 0.2 0.0 - 1.0 mg/dL   Nitrite NEGATIVE NEGATIVE   Leukocytes, UA NEGATIVE NEGATIVE  Pregnancy, urine POC     Status: None   Collection Time: 07/05/15 12:47 PM  Result Value Ref Range   Preg Test, Ur NEGATIVE NEGATIVE  Wet prep, genital     Status: Abnormal   Collection Time: 07/05/15  2:28 PM  Result Value Ref Range   Yeast Wet Prep HPF POC NONE SEEN NONE SEEN  Trich, Wet Prep NONE SEEN NONE SEEN   Clue Cells Wet Prep HPF POC FEW (A) NONE SEEN   WBC, Wet Prep HPF POC MODERATE (A) NONE SEEN     Assessment and Plan  A:  Bacterial Vaginosis, chronic, previous UA showed 4 different BV organisms       History cervical lesion, 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

## 2015-08-21 ENCOUNTER — Encounter: Payer: Medicaid Other | Admitting: Obstetrics & Gynecology

## 2015-08-23 ENCOUNTER — Encounter: Payer: Medicaid Other | Admitting: Obstetrics & Gynecology

## 2015-08-29 ENCOUNTER — Ambulatory Visit (INDEPENDENT_AMBULATORY_CARE_PROVIDER_SITE_OTHER): Payer: Medicaid Other | Admitting: Medical

## 2015-08-29 VITALS — BP 125/69 | HR 76 | Temp 98.2°F | Ht 67.0 in | Wt 255.0 lb

## 2015-08-29 DIAGNOSIS — B3731 Acute candidiasis of vulva and vagina: Secondary | ICD-10-CM

## 2015-08-29 DIAGNOSIS — N760 Acute vaginitis: Secondary | ICD-10-CM | POA: Diagnosis not present

## 2015-08-29 DIAGNOSIS — B373 Candidiasis of vulva and vagina: Secondary | ICD-10-CM

## 2015-08-29 DIAGNOSIS — A499 Bacterial infection, unspecified: Secondary | ICD-10-CM | POA: Diagnosis not present

## 2015-08-29 DIAGNOSIS — B9689 Other specified bacterial agents as the cause of diseases classified elsewhere: Secondary | ICD-10-CM

## 2015-08-29 LAB — POCT PREGNANCY, URINE: PREG TEST UR: NEGATIVE

## 2015-08-29 MED ORDER — METRONIDAZOLE 0.75 % VA GEL: 1.0000 | VAGINAL | Status: DC

## 2015-08-29 MED ORDER — FLUCONAZOLE 150 MG PO TABS: 150.0000 mg | ORAL_TABLET | Freq: Once | ORAL | Status: DC

## 2015-08-29 MED ORDER — TINIDAZOLE 500 MG PO TABS: 2.0000 g | ORAL_TABLET | Freq: Every day | ORAL | Status: DC

## 2015-08-29 NOTE — Patient Instructions (Signed)
Abnormal Pap Test Information During a Pap test, the cells on the surface of your cervix are checked to see if they look normal, abnormal, or if they show signs of having been altered by a certain type of virus called human papillomavirus, or HPV. Cervical cells that have been affected by HPV are called dysplasia. Dysplasia is not cancer, but describes abnormal cells found on the surface of the cervix. Depending on the degree of dysplasia, some of the cells may be considered pre-cancerous and may turn into cancer over time if follow up with a caregiver is delayed.  WHAT DOES AN ABNORMAL PAP TEST MEAN? Having an abnormal pap test does not mean that you have cancer. However, certain types of abnormal pap tests can be a sign that a person is at a higher risk of developing cancer. Your caregiver will want to do other tests to find out more about the abnormal cells. Your abnormal Pap test results could show:   Small and uncertain changes that should be carefully watched.   Cervical dysplasia that has caused mild changes and can be followed over time.  Cervical dysplasia that is more severe and needs to be followed and treated to ensure the problem goes away.  Cancer.  When severe cervical dysplasia is found and treated early, it rarely will grow into cancer.  WHAT WILL BE DONE ABOUT MY ABNORMAL PAP TEST?  A colposcopy may be needed. This is a procedure where your cervix is examined using light and magnification.  A small tissue sample of your cervix (biopsy) may need to be removed and then examined. This is often performed if there are areas that appear infected.  A sample of cells from the cervical canal may be removed with either a small brush or scraping instrument (curette). Based on the results of the procedures above, some caregivers may recommend either cryotherapy of the cervix or a surgical LEEP where a portion of the cervix is removed. LEEP is short for "loop electrical excisional  procedure." Rarely, a caregiver may recommend a cone biopsy.This is a procedure where a small, cone-shaped sample of your cervix is taken out. The part that is taken out is the area where the abnormal cells are.  WHAT IF I HAVE A DYSPLASIA OR A CANCER? You may be referred to a specialist. Radiation may also be a treatment for more advanced cancer. Having a hysterectomy is the last treatment option for dysplasia, but it is a more common treatment for someone with cancer. All treatment options will be discussed with you by your caregiver. WHAT SHOULD YOU DO AFTER BEING TREATED? If you have had an abnormal pap test, you should continue to have regular pap tests and check-ups as directed by your caregiver. Your cervical problem will be carefully watched so it does not get worse. Also, your caregiver can watch for, and treat, any new problems that may come up. Document Released: 03/03/2011 Document Revised: 03/13/2013 Document Reviewed: 11/12/2011 [facility] Patient Information 2015 [place], [place]. This information is not intended to replace advice given to you by your health care Tanya Austin. Make sure you discuss any questions you have with your health care Tanya Austin. Bacterial Vaginosis Bacterial vaginosis is an infection of the vagina. It happens when too many of certain germs (bacteria) grow in the vagina. HOME CARE  Take your medicine as told by your doctor.  Finish your medicine even if you start to feel better.  Do not have sex until you finish your medicine and are better.  Tell your sex partner that you have an infection. They should see their doctor for treatment.  Practice safe sex. Use condoms. Have only one sex partner. GET HELP IF:  You are not getting better after 3 days of treatment.  You have more grey fluid (discharge) coming from your vagina than before.  You have more pain than before.  You have a fever. MAKE SURE YOU:   Understand these instructions.  Will watch your  condition.  Will get help right away if you are not doing well or get worse. Document Released: 08/25/2008 Document Revised: 09/06/2013 Document Reviewed: 06/28/2013 [facility] Patient Information 2015 [place], [place]. This information is not intended to replace advice given to you by your health care Tanya Austin. Make sure you discuss any questions you have with your health care Tanya Austin.

## 2015-08-29 NOTE — Progress Notes (Signed)
Patient ID: Tanya Austin, female   DOB: 08-04-1971, 44 y.o.   MRN: [id number]  History:  Tanya Austin is a 44 y.o. [id number] who presents to clinic today for recurrent BV. The patient was initially evaluated by Dr. Ruthann Cancer approximately 1 year ago and diagnosed with BV. Tanya Austin states that he tried her on Flagyl, metrogel and Tindamax without relief although no extended regimens were ever prescribed. Tanya Austin states that Tanya Austin was seen in the ED and that the MD was concerned about the friability of her cervix. 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 The patient denies history of abnormal pap smears. Tanya Austin states that last pap smear was norma lin 2015. The patient was seen in [facility] in July and August and was treated for possible cervicitis with Minocycline and then given Rx for Clindamycin gel for BV. The patient was unable to afford the Clindamycin gel given in August. Since then Tanya Austin has been using Rephresh wipes for the continued discharge and odor. Tanya Austin is sexually active with one partner.    There are no active problems to display for this patient.   No Known Allergies  Current Outpatient Prescriptions on File Prior to Visit  Medication Sig Dispense Refill  . acetaminophen (TYLENOL) 500 MG tablet Take 1,000 mg by mouth 2 (two) times daily as needed for moderate pain.    . B-D UF III MINI PEN NEEDLES 31G X 5 MM MISC USE UP TO BID FOR INJECTION OF INSULIN  3  . fish oil-omega-3 fatty acids 1000 MG capsule Take 1 g by mouth daily.    . Insulin Glargine (TOUJEO SOLOSTAR) 300 UNIT/ML SOPN Inject 14 Units into the skin at bedtime.    . IRON PO Take 1 tablet by mouth daily.    . Norgestimate-Ethinyl Estradiol Triphasic (TRINESSA, 28,) 0.18/0.215/0.25 MG-35 MCG tablet Take 1 tablet by mouth daily.    . pioglitazone (ACTOS) 15 MG tablet Take 15 mg by mouth daily.    . simvastatin (ZOCOR) 20 MG tablet Take 20 mg by mouth at bedtime.     .  sitaGLIPtin-metformin (JANUMET) 50-1000 MG per tablet Take 1 tablet by mouth 2 (two) times daily with a meal.    . ibuprofen (ADVIL,MOTRIN) 200 MG tablet Take 400 mg by mouth every 6 (six) hours as needed.     No current facility-administered medications on file prior to visit.     The following portions of the patient's history were reviewed and updated as appropriate: allergies, current medications, family history, past medical history, social history, past surgical history and problem list.  Review of Systems:  Other than those mentioned in HPI all ROS negative  Objective:  Physical Exam BP 125/69 mmHg  Pulse 76  Temp(Src) 98.2 F (36.8 C) (Oral)  Ht 5\' 7"  (1.702 m)  Wt 255 lb (115.667 kg)  BMI 39.93 kg/m2 CONSTITUTIONAL: Well-developed, well-nourished female in no acute distress.  EYES: EOM intact, conjunctivae normal, no scleral icterus HEAD: Normocephalic, atraumatic ENT: External right and left ear normal, oropharynx is clear and moist. CARDIOVASCULAR: Normal heart rate noted. No cyanosis or edema.  RESPIRATORY: Effort normal, no problems with respiration noted. GASTROINTESTINAL:Soft, no distention noted.   GENITOURINARY: Normal appearing external genitalia; normal appearing vaginal mucosa. Cervix exhibits some mild friability with minimal bleeding. Copious amounts of yellow thin mucus discharge and small amount of thick, clumpy white discharge noted. Wet prep obtained.   MUSCULOSKELETAL: Normal range of motion.  SKIN: Skin is warm and dry. No rash noted. Not diaphoretic. No erythema. No pallor. [place]: Alert and oriented to person, place, and time. Normal reflexes, muscle tone, coordination.  PSYCHIATRIC: Normal mood and affect. Normal behavior. Normal judgment and thought content.  Labs and Imaging Wet prep obtained  MDM Will attempt treatment of BV with long-term therapy including Tindamax and Metrogel Patient treated for clinical concurrent yeast infection as  well Discharge may be stemming from recent cervical biopsy, although biopsy site appears well-healed. Assessment & Plan:  Assessment: Bacterial vaginosis, recurrent Yeast vulvovaginitis  Plans: Rx for Tinamax x 2 days, Metrogel twice weekly x 2-4 months and Diflucan x 3 doses given  Discussed hygiene products, probiotics and safe sex practices for avoiding recurrence of BV Patient to return to [facility] in 2-3 months for follow-up and re-evaluation of cervical biopsy site or sooner if symptoms were to change or worsen  Luvenia Redden, PA-C 08/29/2015 4:50 PM

## 2015-08-30 LAB — WET PREP, GENITAL
TRICH WET PREP: NONE SEEN
YEAST WET PREP: NONE SEEN

## 2015-09-02 ENCOUNTER — Telehealth: Payer: Self-pay | Admitting: *Deleted

## 2015-09-02 NOTE — Telephone Encounter (Signed)
Contacted patient,pt  States she has completed Tindamax and needs to know if she should use Metrogel/Diflucan.  Informed patient that I would discuss with Kerry Hough, PA.  Pt verbalizes understanding. Discussed with Dr. Roselie Awkward, states to keep using medications.  Contacted patient, informed of recommendation by MD, message sent to front office to make her an appointment.

## 2015-09-06 ENCOUNTER — Other Ambulatory Visit (HOSPITAL_COMMUNITY)
Admission: RE | Admit: 2015-09-06 | Discharge: 2015-09-06 | Disposition: A | Discharge status: Home / Self Care | Payer: Medicaid Other | Source: Ambulatory Visit | Attending: Medical | Admitting: Medical

## 2015-09-06 ENCOUNTER — Encounter: Payer: Self-pay | Admitting: Medical

## 2015-09-06 ENCOUNTER — Ambulatory Visit (INDEPENDENT_AMBULATORY_CARE_PROVIDER_SITE_OTHER): Payer: Medicaid Other | Admitting: Medical

## 2015-09-06 VITALS — BP 127/79 | HR 79 | Temp 98.4°F | Ht 67.0 in | Wt 244.0 lb

## 2015-09-06 DIAGNOSIS — N898 Other specified noninflammatory disorders of vagina: Secondary | ICD-10-CM

## 2015-09-06 DIAGNOSIS — N760 Acute vaginitis: Secondary | ICD-10-CM | POA: Insufficient documentation

## 2015-09-06 LAB — CERVICOVAGINAL ANCILLARY ONLY: Wet Prep (BD Affirm): POSITIVE — AB

## 2015-09-06 NOTE — Patient Instructions (Signed)

## 2015-09-06 NOTE — Progress Notes (Signed)
Patient ID: Tanya Austin, female   DOB: October 13, 1971, 44 y.o.   MRN: [id number]  History:  Tanya Austin is a 44 y.o. [id number] who presents to clinic today for BD Affirm testing. The patient has had issues with chronic BV unresolved with Flagyl, Metrogel and/or Tindamax. The patient has had issues with copious vaginal discharge for months. After consultation with Dr. Kennon Rounds it was decided that the BD Affirm testing would help confirm which strain of Gardnerella the patient has in order to decide on best treatment.    The following portions of the patient's history were reviewed and updated as appropriate: allergies, current medications, family history, past medical history, social history, past surgical history and problem list.  Review of Systems:  Other than those mentioned in HPI all ROS negative  Objective:  Physical Exam BP 127/79 mmHg  Pulse 79  Temp(Src) 98.4 F (36.9 C) (Oral)  Ht 5\' 7"  (1.702 m)  Wt 244 lb (110.678 kg)  BMI 38.21 kg/m2  LMP 08/27/2015 CONSTITUTIONAL: Well-developed, well-nourished female in no acute distress.  EYES: EOM intact, conjunctivae normal, no scleral icterus HEAD: Normocephalic, atraumatic ENT: External right and left ear normal, oropharynx is clear and moist. CARDIOVASCULAR: Normal heart rate noted, regular rhythm. No cyanosis or edema.  RESPIRATORY: Effort normal, no problems with respiration noted. GASTROINTESTINAL:Soft, no distention noted.   GENITOURINARY: Normal appearing external genitalia; normal appearing vaginal mucosa and cervix.  Moderate amount of thick and mucus yellow-white discharge.  BD Affirm obtained.    MUSCULOSKELETAL: Normal range of motion.  SKIN: Skin is warm and dry. No rash noted. Not diaphoretic. No erythema. No pallor. [place]: Alert and oriented to person, place, and time. Normal muscle tone, coordination.  PSYCHIATRIC: Normal mood and affect. Normal behavior. Normal judgment and thought  content. HEM/LYMPH/IMMUNOLOGIC: Neck supple  Labs and Imaging BD Affirm obtained  Assessment & Plan:  Assessment: Vaginal discharge  Plans: BD Affirm test obtained. Patient will be contacted with results Advised to continue previous regimen of Metrogel until results are final Patient to return to [facility] as needed or if symptoms were to change or worsen  Luvenia Redden, PA-C 09/06/2015 9:21 AM

## 2015-09-10 ENCOUNTER — Telehealth: Payer: Self-pay | Admitting: *Deleted

## 2015-09-10 MED ORDER — CLINDAMYCIN HCL 300 MG PO CAPS: 300.0000 mg | ORAL_CAPSULE | Freq: Two times a day (BID) | ORAL | Status: DC

## 2015-09-10 NOTE — Telephone Encounter (Signed)
Per Kerry Hough PA, called patient to check whether patient still symptomatic while taking Metrogel and Tinidazole. She stated she is still having symptoms. I informed her that since she was still having issues that I would be sending in a prescription for clindamycin 300mg  bid x 7days. She will need to take a probiotic daily before breakfast and to use condoms if she chooses to have sex. Patient voiced understanding.

## 2015-10-18 ENCOUNTER — Ambulatory Visit: Payer: Medicaid Other | Admitting: Obstetrics & Gynecology

## 2015-10-21 ENCOUNTER — Other Ambulatory Visit: Payer: Self-pay | Admitting: Medical

## 2015-10-30 ENCOUNTER — Encounter: Payer: Self-pay | Admitting: Physician Assistant

## 2015-10-30 ENCOUNTER — Ambulatory Visit (INDEPENDENT_AMBULATORY_CARE_PROVIDER_SITE_OTHER): Payer: Medicaid Other | Admitting: Physician Assistant

## 2015-10-30 VITALS — BP 128/64 | HR 89 | Temp 98.5°F | Wt 256.3 lb

## 2015-10-30 DIAGNOSIS — N898 Other specified noninflammatory disorders of vagina: Secondary | ICD-10-CM | POA: Diagnosis not present

## 2015-10-30 LAB — WET PREP, GENITAL
Trich, Wet Prep: NONE SEEN
YEAST WET PREP: NONE SEEN

## 2015-10-30 NOTE — Progress Notes (Signed)
Patient ID: Tanya Austin, female   DOB: [birth year], 44 y.o.   MRN: [id number] History:  Tanya Austin is a 44 y.o. [id number] who presents to clinic today for eval of vaginal discharge.  It has been ongoing for several months.  She notes no odor or irritation.  She thinks she has BV or yeast infection.    The following portions of the patient's history were reviewed and updated as appropriate: allergies, current medications, past family history, past medical history, past social history, past surgical history and problem list.  Review of Systems:  Pertinent items noted in HPI and remainder of comprehensive ROS otherwise negative.  Objective:  Physical Exam BP 128/64 mmHg  Pulse 89  Temp(Src) 98.5 F (36.9 C)  Wt 256 lb 4.8 oz (116.257 kg)  LMP 10/22/2015 (Exact Date) GENERAL: Well-developed, well-nourished female in no acute distress.  HEENT: Normocephalic, atraumatic.  NECK: Supple. Normal thyroid.  RESPIRATORY: Normal rate. No resp distress ABDOMEN: Soft, nontender, nondistended. No organomegaly.  PELVIC: Normal external female genitalia. Vagina is pink and rugated.  Thick homogenous off-white discharge. Normal cervix contour with erythematous cervix.  No adnexal mass or tenderness.  EXTREMITIES: No cyanosis, clubbing, or edema, 2+ distal pulses.   Labs and Imaging No results found.  Assessment & Plan:  Assessment: Vaginal discharge  Plans: Wet prep pending.  Await results. Pt to f/u prn  Paticia Stack, PA-C 10/30/2015 4:49 PM

## 2015-10-30 NOTE — Patient Instructions (Signed)

## 2015-11-11 ENCOUNTER — Telehealth: Payer: Self-pay

## 2015-11-11 NOTE — Telephone Encounter (Signed)
Pt requesting test results.  Called pt and informed pt that her wet prep resulted in few clue cells which most providers don't treat unless pt would like to.  Pt stated that she was fine and did not want to get treatment.  I advised her to call if she questions or concerns.  Pt agreed.

## 2015-11-25 ENCOUNTER — Encounter (HOSPITAL_COMMUNITY): Payer: Self-pay

## 2015-11-25 ENCOUNTER — Inpatient Hospital Stay (HOSPITAL_COMMUNITY)
Admission: AD | Admit: 2015-11-25 | Discharge: 2015-11-25 | Disposition: A | Discharge status: Home / Self Care | Payer: Medicaid Other | Source: Ambulatory Visit | Attending: Obstetrics and Gynecology | Admitting: Obstetrics and Gynecology

## 2015-11-25 DIAGNOSIS — A499 Bacterial infection, unspecified: Secondary | ICD-10-CM

## 2015-11-25 DIAGNOSIS — Z794 Long term (current) use of insulin: Secondary | ICD-10-CM | POA: Diagnosis not present

## 2015-11-25 DIAGNOSIS — N760 Acute vaginitis: Secondary | ICD-10-CM | POA: Diagnosis not present

## 2015-11-25 DIAGNOSIS — Z79899 Other long term (current) drug therapy: Secondary | ICD-10-CM | POA: Diagnosis not present

## 2015-11-25 DIAGNOSIS — E7800 Pure hypercholesterolemia, unspecified: Secondary | ICD-10-CM | POA: Diagnosis not present

## 2015-11-25 DIAGNOSIS — E119 Type 2 diabetes mellitus without complications: Secondary | ICD-10-CM | POA: Insufficient documentation

## 2015-11-25 DIAGNOSIS — Z87891 Personal history of nicotine dependence: Secondary | ICD-10-CM | POA: Diagnosis not present

## 2015-11-25 DIAGNOSIS — B029 Zoster without complications: Secondary | ICD-10-CM

## 2015-11-25 DIAGNOSIS — B9689 Other specified bacterial agents as the cause of diseases classified elsewhere: Secondary | ICD-10-CM

## 2015-11-25 DIAGNOSIS — Z7984 Long term (current) use of oral hypoglycemic drugs: Secondary | ICD-10-CM | POA: Insufficient documentation

## 2015-11-25 DIAGNOSIS — M545 Low back pain, unspecified: Secondary | ICD-10-CM

## 2015-11-25 DIAGNOSIS — R102 Pelvic and perineal pain: Secondary | ICD-10-CM | POA: Diagnosis present

## 2015-11-25 LAB — URINALYSIS, ROUTINE W REFLEX MICROSCOPIC
Bilirubin Urine: NEGATIVE
Glucose, UA: NEGATIVE mg/dL
HGB URINE DIPSTICK: NEGATIVE
Ketones, ur: NEGATIVE mg/dL
LEUKOCYTES UA: NEGATIVE
Nitrite: NEGATIVE
PROTEIN: NEGATIVE mg/dL
Specific Gravity, Urine: >1.03 — ABNORMAL HIGH (ref 1.005–1.030)
pH: 5.5 (ref 5.0–8.0)

## 2015-11-25 LAB — CBC
HCT: 32.3 % — ABNORMAL LOW (ref 36.0–46.0)
Hemoglobin: 10.6 g/dL — ABNORMAL LOW (ref 12.0–15.0)
MCH: 28.6 pg (ref 26.0–34.0)
MCHC: 32.8 g/dL (ref 30.0–36.0)
MCV: 87.1 fL (ref 78.0–100.0)
Platelets: 202 10*3/uL (ref 150–400)
RBC: 3.71 MIL/uL — AB (ref 3.87–5.11)
RDW: 13.2 % (ref 11.5–15.5)
WBC: 7.4 10*3/uL (ref 4.0–10.5)

## 2015-11-25 LAB — WET PREP, GENITAL
Sperm: NONE SEEN
TRICH WET PREP: NONE SEEN
YEAST WET PREP: NONE SEEN

## 2015-11-25 LAB — POCT PREGNANCY, URINE: PREG TEST UR: NEGATIVE

## 2015-11-25 MED ORDER — CYCLOBENZAPRINE HCL 10 MG PO TABS: 10.0000 mg | ORAL_TABLET | Freq: Three times a day (TID) | ORAL | Status: DC | PRN

## 2015-11-25 MED ORDER — METRONIDAZOLE 0.75 % VA GEL: 1.0000 | Freq: Every day | VAGINAL | Status: DC

## 2015-11-25 MED ORDER — VALACYCLOVIR HCL 1 G PO TABS: 1000.0000 mg | ORAL_TABLET | Freq: Two times a day (BID) | ORAL | Status: DC

## 2015-11-25 MED ORDER — METRONIDAZOLE 500 MG PO TABS
500.0000 mg | ORAL_TABLET | Freq: Two times a day (BID) | ORAL | Status: AC
Start: 2015-11-25 — End: 2015-12-02

## 2015-11-25 NOTE — Discharge Instructions (Signed)
Bacterial Vaginosis Bacterial vaginosis is an infection of the vagina. It happens when too many germs (bacteria) grow in the vagina. Having this infection puts you at risk for getting other infections from sex. Treating this infection can help lower your risk for other infections, such as:   Chlamydia.  Gonorrhea.  HIV.  Herpes. HOME CARE  Take your medicine as told by your doctor.  Finish your medicine even if you start to feel better.  Tell your sex partner that you have an infection. They should see their doctor for treatment.  During treatment:  Avoid sex or use condoms correctly.  Do not douche.  Do not drink alcohol unless your doctor tells you it is ok.  Do not breastfeed unless your doctor tells you it is ok. GET HELP IF:  You are not getting better after 3 days of treatment.  You have more grey fluid (discharge) coming from your vagina than before.  You have more pain than before.  You have a fever. MAKE SURE YOU:   Understand these instructions.  Will watch your condition.  Will get help right away if you are not doing well or get worse.   This information is not intended to replace advice given to you by your health care provider. Make sure you discuss any questions you have with your health care provider.   Document Released: 08/25/2008 Document Revised: 12/07/2014 Document Reviewed: 06/28/2013 Elsevier Interactive Patient Education 2016 [employer] Back Pain, Adult Back pain is very common in adults.The cause of back pain is rarely dangerous and the pain often gets better over time.The cause of your back pain may not be known. Some common causes of back pain include:  Strain of the muscles or ligaments supporting the spine.  Wear and tear (degeneration) of the spinal disks.  Arthritis.  Direct injury to the back. For many people, back pain may return. Since back pain is rarely dangerous, most people can learn to manage this condition on  their own. HOME CARE INSTRUCTIONS Watch your back pain for any changes. The following actions may help to lessen any discomfort you are feeling:  Remain active. It is stressful on your back to sit or stand in one place for long periods of time. Do not sit, drive, or stand in one place for more than 30 minutes at a time. Take short walks on even surfaces as soon as you are able.Try to increase the length of time you walk each day.  Exercise regularly as directed by your health care provider. Exercise helps your back heal faster. It also helps avoid future injury by keeping your muscles strong and flexible.  Do not stay in bed.Resting more than 1-2 days can delay your recovery.  Pay attention to your body when you bend and lift. The most comfortable positions are those that put less stress on your recovering back. Always use proper lifting techniques, including:  Bending your knees.  Keeping the load close to your body.  Avoiding twisting.  Find a comfortable position to sleep. Use a firm mattress and lie on your side with your knees slightly bent. If you lie on your back, put a pillow under your knees.  Avoid feeling anxious or stressed.Stress increases muscle tension and can worsen back pain.It is important to recognize when you are anxious or stressed and learn ways to manage it, such as with exercise.  Take medicines only as directed by your health care provider. Over-the-counter medicines to reduce pain and inflammation are   often the most helpful.Your health care provider may prescribe muscle relaxant drugs.These medicines help dull your pain so you can more quickly return to your normal activities and healthy exercise.  Apply ice to the injured area:  Put ice in a plastic bag.  Place a towel between your skin and the bag.  Leave the ice on for 20 minutes, 2-3 times a day for the first 2-3 days. After that, ice and heat may be alternated to reduce pain and spasms.  Maintain a  healthy weight. Excess weight puts extra stress on your back and makes it difficult to maintain good posture. SEEK MEDICAL CARE IF:  You have pain that is not relieved with rest or medicine.  You have increasing pain going down into the legs or buttocks.  You have pain that does not improve in one week.  You have night pain.  You lose weight.  You have a fever or chills. SEEK IMMEDIATE MEDICAL CARE IF:   You develop new bowel or bladder control problems.  You have unusual weakness or numbness in your arms or legs.  You develop nausea or vomiting.  You develop abdominal pain.  You feel faint.   This information is not intended to replace advice given to you by your health care provider. Make sure you discuss any questions you have with your health care provider.   Document Released: 11/16/2005 Document Revised: 12/07/2014 Document Reviewed: 03/20/2014 Elsevier Interactive Patient Education 2016 [employer] Inc[employer]  

## 2015-11-25 NOTE — [facility] Note (Signed)
Pt reports vaginal pain x 5 days, denies blisters. Burning sensation esp with urination.

## 2015-11-25 NOTE — MAU Provider Note (Signed)
History     CSN: [id number]  Arrival date and time: 11/25/15 [id number]   First Provider Initiated Contact with Patient 11/25/15 2020         Chief Complaint  Patient presents with  . Vaginal Pain  . Back Pain   Tanya Austin is a 44 y.o. female who presents for vaginal pain. States she has burning in the vaginal area. Pretty sure it is "yeast again".  Has had multiple episodes of BV.  Also has hx of HSV.  Tells me she is not really concerned about STDs but wants to be tested for "everything".  Also c/o low back pain for 3 months.   Female GU Problem The patient's primary symptoms include genital lesions and vaginal discharge. Primary symptoms comment: vaginal pain. This is a new problem. The current episode started in the past 7 days. The problem has been gradually worsening. She is not pregnant. Associated symptoms include back pain and dysuria (pain when urine touches skin). Pertinent negatives include no abdominal pain, constipation, diarrhea, fever, flank pain, hematuria, nausea, painful intercourse or vomiting. The vaginal discharge was malodorous (can't tell what it looks like). There has been no bleeding. The symptoms are aggravated by urinating (touching). She has tried nothing for the symptoms. She is sexually active. It is possible that her partner has an STD. She uses nothing for contraception. Her menstrual history has been regular. Her past medical history is significant for herpes simplex (last outbreak 2 wks ago) and an STD.    Pt states this feels somewhat different from HSV outbreaks. Has never taken meds for HSV.  Requesting STD testing today.   At end of exam, pt reports left lower back pain x 3 months that feels tight. Has appt in the morning with her PCP.    OB History    Gravida Para Term Preterm AB TAB SAB Ectopic Multiple Living   4 4 4  0      4      Past Medical History  Diagnosis Date  . Diabetes mellitus   . High cholesterol     Past Surgical History   Procedure Laterality Date  . Cervical biopsy      History reviewed. No pertinent family history.  Social History  Substance Use Topics  . Smoking status: Former Smoker    Types: Cigarettes    Quit date: 06/09/2000  . Smokeless tobacco: None  . Alcohol Use: Yes     Comment: Socially on weekends    Allergies: No Known Allergies  Prescriptions prior to admission  Medication Sig Dispense Refill Last Dose  . B-D UF III MINI PEN NEEDLES 31G X 5 MM MISC USE UP TO BID FOR INJECTION OF INSULIN  3 11/25/2015 at Unknown time  . fish oil-omega-3 fatty acids 1000 MG capsule Take 1 g by mouth daily.   11/25/2015 at Unknown time  . Insulin Glargine (TOUJEO SOLOSTAR) 300 UNIT/ML SOPN Inject 14 Units into the skin at bedtime.   11/25/2015 at Unknown time  . IRON PO Take 1 tablet by mouth daily.   11/25/2015 at Unknown time  . Norgestimate-Ethinyl Estradiol Triphasic (TRINESSA, 28,) 0.18/0.215/0.25 MG-35 MCG tablet Take 1 tablet by mouth daily.   11/25/2015 at Unknown time  . pioglitazone (ACTOS) 15 MG tablet Take 15 mg by mouth daily.   11/25/2015 at Unknown time  . simvastatin (ZOCOR) 20 MG tablet Take 20 mg by mouth at bedtime.    11/25/2015 at Unknown time  . sitaGLIPtin-metformin (JANUMET) 50-1000  MG per tablet Take 1 tablet by mouth 2 (two) times daily with a meal.   11/25/2015 at Unknown time  . ACCU-CHEK AVIVA PLUS test strip USE TO CHECK BLOOD GLUCOSE 3 OR 4 TIMES DAILY  5   . acetaminophen (TYLENOL) 500 MG tablet Take 1,000 mg by mouth 2 (two) times daily as needed for moderate pain.   More than a month at Unknown time  . ibuprofen (ADVIL,MOTRIN) 200 MG tablet Take 400 mg by mouth every 6 (six) hours as needed.   More than a month at Unknown time    Review of Systems  Constitutional: Negative for fever.  Gastrointestinal: Negative for nausea, vomiting, abdominal pain, diarrhea and constipation.  Genitourinary: Positive for dysuria (pain when urine touches skin) and vaginal discharge.  Negative for hematuria and flank pain.       + vaginal discharge No vaginal bleeding  Musculoskeletal: Positive for back pain.   Physical Exam   Blood pressure 118/62, pulse 79, temperature 97.8 F (36.6 C), temperature source Oral, resp. rate 16, last menstrual period 11/14/2015, SpO2 99 %.  Physical Exam  Nursing note and vitals reviewed. Constitutional: She is oriented to person, place, and time. She appears well-developed and well-nourished. No distress.  HENT:  Head: Normocephalic and atraumatic.  Eyes: Conjunctivae are normal. Right eye exhibits no discharge. Left eye exhibits no discharge. No scleral icterus.  Neck: Normal range of motion.  Respiratory: Effort normal and breath sounds normal. No respiratory distress.  Neurological: She is alert and oriented to person, place, and time.  Skin: Skin is warm and dry. She is not diaphoretic.  Psychiatric: She has a normal mood and affect. Her behavior is normal. Judgment and thought content normal.   GU:  Perineum clear except for a small pea-sized lipoma at introitus.         Thin white discharge in vagina, no lesions          Cervix closed and long          Uterus firm and nontender. Adnexae nontender  MAU Course  Procedures Results for orders placed or performed during the hospital encounter of 11/25/15 (from the past 24 hour(s))  Urinalysis, Routine w reflex microscopic (not at [facility])     Status: Abnormal   Collection Time: 11/25/15  7:23 PM  Result Value Ref Range   Color, Urine YELLOW YELLOW   APPearance CLEAR CLEAR   Specific Gravity, Urine >1.030 (H) 1.005 - 1.030   pH 5.5 5.0 - 8.0   Glucose, UA NEGATIVE NEGATIVE mg/dL   Hgb urine dipstick NEGATIVE NEGATIVE   Bilirubin Urine NEGATIVE NEGATIVE   Ketones, ur NEGATIVE NEGATIVE mg/dL   Protein, ur NEGATIVE NEGATIVE mg/dL   Nitrite NEGATIVE NEGATIVE   Leukocytes, UA NEGATIVE NEGATIVE  Pregnancy, urine POC     Status: None   Collection Time: 11/25/15  7:48 PM  Result  Value Ref Range   Preg Test, Ur NEGATIVE NEGATIVE    MDM UPT negative STD testing per patient request 2020- Care turned over to [facility] CNM           Jorje Guild, NP 11/25/2015 8:24 PM   Wet Prep sent. STD tests sent as was blood testing   Ref. Range 11/25/2015 20:25  Yeast Wet Prep HPF POC Latest Ref Range: NONE SEEN  NONE SEEN  Trich, Wet Prep Latest Ref Range: NONE SEEN  NONE SEEN  Clue Cells Wet Prep HPF POC Latest Ref Range: NONE SEEN  PRESENT (A)  WBC, Wet Prep HPF POC Latest Ref Range: NONE SEEN  MODERATE (A)    Discussed BV.  Will Rx treatment and maintenance treatment with twice a week Flagyl Gel Rx #5 Flexeril for back spasm until she can see her primary in the morning Will provide Rx for Valtrex since she never takes anything for her outbreaks  Assessment and Plan  A:  Bacterial Vaginosis      Risk for STDs  P;;  Discharge home        Rx Flagyl PO x 7 d        Rx Metrogel for twice a week use       Rx Flexeril #5 tablets, warned about side effects/no driving        Rx Valtrex for PRN use        Follow up with primary MD in am

## 2015-11-26 LAB — RPR: RPR Ser Ql: NONREACTIVE

## 2015-11-26 LAB — HIV ANTIBODY (ROUTINE TESTING W REFLEX): HIV SCREEN 4TH GENERATION: NONREACTIVE

## 2015-11-29 LAB — GC/CHLAMYDIA PROBE AMP (~~LOC~~) NOT AT ARMC
Chlamydia: NEGATIVE
NEISSERIA GONORRHEA: NEGATIVE

## 2015-12-01 DIAGNOSIS — Z8619 Personal history of other infectious and parasitic diseases: Secondary | ICD-10-CM

## 2015-12-01 HISTORY — DX: Personal history of other infectious and parasitic diseases: Z86.19

## 2016-02-10 ENCOUNTER — Ambulatory Visit (INDEPENDENT_AMBULATORY_CARE_PROVIDER_SITE_OTHER): Payer: Medicaid Other | Admitting: *Deleted

## 2016-02-10 DIAGNOSIS — Z111 Encounter for screening for respiratory tuberculosis: Secondary | ICD-10-CM | POA: Diagnosis present

## 2016-02-10 MED ORDER — TUBERCULIN PPD 5 UNIT/0.1ML ID SOLN
5.0000 [IU] | Freq: Once | INTRADERMAL | Status: AC
  Administered 2016-02-10: 5 [IU] via INTRADERMAL

## 2016-02-12 ENCOUNTER — Encounter: Payer: Self-pay | Admitting: *Deleted

## 2016-02-12 NOTE — Progress Notes (Signed)
02/12/16  TB skin test negative.

## 2016-03-12 ENCOUNTER — Inpatient Hospital Stay (HOSPITAL_COMMUNITY)
Admission: AD | Admit: 2016-03-12 | Discharge: 2016-03-12 | Disposition: A | Discharge status: Home / Self Care | Payer: Medicaid Other | Source: Ambulatory Visit | Attending: Obstetrics & Gynecology | Admitting: Obstetrics & Gynecology

## 2016-03-12 ENCOUNTER — Encounter (HOSPITAL_COMMUNITY): Payer: Self-pay | Admitting: *Deleted

## 2016-03-12 DIAGNOSIS — E119 Type 2 diabetes mellitus without complications: Secondary | ICD-10-CM | POA: Diagnosis not present

## 2016-03-12 DIAGNOSIS — Z87891 Personal history of nicotine dependence: Secondary | ICD-10-CM | POA: Insufficient documentation

## 2016-03-12 DIAGNOSIS — Z113 Encounter for screening for infections with a predominantly sexual mode of transmission: Secondary | ICD-10-CM

## 2016-03-12 DIAGNOSIS — B3731 Acute candidiasis of vulva and vagina: Secondary | ICD-10-CM

## 2016-03-12 DIAGNOSIS — Z79899 Other long term (current) drug therapy: Secondary | ICD-10-CM | POA: Insufficient documentation

## 2016-03-12 DIAGNOSIS — Z794 Long term (current) use of insulin: Secondary | ICD-10-CM | POA: Diagnosis not present

## 2016-03-12 DIAGNOSIS — B373 Candidiasis of vulva and vagina: Secondary | ICD-10-CM | POA: Diagnosis not present

## 2016-03-12 DIAGNOSIS — E7800 Pure hypercholesterolemia, unspecified: Secondary | ICD-10-CM | POA: Insufficient documentation

## 2016-03-12 DIAGNOSIS — Z7984 Long term (current) use of oral hypoglycemic drugs: Secondary | ICD-10-CM | POA: Insufficient documentation

## 2016-03-12 DIAGNOSIS — R109 Unspecified abdominal pain: Secondary | ICD-10-CM | POA: Diagnosis present

## 2016-03-12 LAB — WET PREP, GENITAL
CLUE CELLS WET PREP: NONE SEEN
SPERM: NONE SEEN
Trich, Wet Prep: NONE SEEN
Yeast Wet Prep HPF POC: NONE SEEN

## 2016-03-12 LAB — URINALYSIS, ROUTINE W REFLEX MICROSCOPIC
Bilirubin Urine: NEGATIVE
Glucose, UA: NEGATIVE mg/dL
Hgb urine dipstick: NEGATIVE
Ketones, ur: 15 mg/dL — AB
Nitrite: NEGATIVE
Protein, ur: NEGATIVE mg/dL
Specific Gravity, Urine: >1.03 — ABNORMAL HIGH (ref 1.005–1.030)
pH: 5.5 (ref 5.0–8.0)

## 2016-03-12 LAB — URINE MICROSCOPIC-ADD ON: RBC / HPF: NONE SEEN RBC/hpf (ref 0–5)

## 2016-03-12 LAB — POCT PREGNANCY, URINE: PREG TEST UR: NEGATIVE

## 2016-03-12 MED ORDER — FLUCONAZOLE 150 MG PO TABS: 150.0000 mg | ORAL_TABLET | Freq: Once | ORAL | Status: DC

## 2016-03-12 NOTE — MAU Provider Note (Signed)
History     CSN: [id number]  Arrival date and time: 03/12/16 1452   First Provider Initiated Contact with Patient 03/12/16 1651      Chief Complaint  Patient presents with  . Abdominal Pain  . Vaginal Discharge   HPI  Pt is 45 yo AA not pregnant [id number] presents with 3 week history of foul smelling vaginal discharge with IC 5 days previously- then pt expelled a condom at that time.  Pt noticed cottage cheese vaginal discharge that  Turned to green/mucous discharge that itches.  Pt has used metrogel 2 times a week x 3 weeks with no relief of sx. Pt is a diabetic, treated and monitored by [facility].  Pt has had some diarrhea that started through yesterday and also had some lower abd pain yesterday.  Pt has hx of BV. And hx of HSV with last outbreak 2 months ago- pt is not on suppressive or episodic treatment. RN note: RN Occupational hygienist)     Expand All Collapse All   Pt reports she has had a yellow discharge for 2-3 weeks. Had a refill on her metrogel and tried that but it has not helped. Stared having abd pain last night. No pain today.       Past Medical History  Diagnosis Date  . Diabetes mellitus   . High cholesterol     Past Surgical History  Procedure Laterality Date  . Cervical biopsy      History reviewed. No pertinent family history.  Social History  Substance Use Topics  . Smoking status: Former Smoker    Types: Cigarettes    Quit date: 06/09/2000  . Smokeless tobacco: None  . Alcohol Use: Yes     Comment: Socially on weekends    Allergies: No Known Allergies  Prescriptions prior to admission  Medication Sig Dispense Refill Last Dose  . ACCU-CHEK AVIVA PLUS test strip USE TO CHECK BLOOD GLUCOSE 3 OR 4 TIMES DAILY  5   . acetaminophen (TYLENOL) 500 MG tablet Take 1,000 mg by mouth 2 (two) times daily as needed for moderate pain.   More than a month at Unknown time  . B-D UF III MINI PEN NEEDLES 31G X 5 MM MISC USE UP TO BID FOR INJECTION OF INSULIN   3 11/25/2015 at Unknown time  . cyclobenzaprine (FLEXERIL) 10 MG tablet Take 1 tablet (10 mg total) by mouth 3 (three) times daily as needed for muscle spasms. 5 tablet 0   . fish oil-omega-3 fatty acids 1000 MG capsule Take 1 g by mouth daily.   11/25/2015 at Unknown time  . ibuprofen (ADVIL,MOTRIN) 200 MG tablet Take 400 mg by mouth every 6 (six) hours as needed.   More than a month at Unknown time  . Insulin Glargine (TOUJEO SOLOSTAR) 300 UNIT/ML SOPN Inject 14 Units into the skin at bedtime.   11/25/2015 at Unknown time  . IRON PO Take 1 tablet by mouth daily.   11/25/2015 at Unknown time  . metroNIDAZOLE (METROGEL) 0.75 % vaginal gel Place 1 Applicatorful vaginally at bedtime. Apply one applicatorful to vagina at bedtime twice a week for prevention 70 g 2   . Norgestimate-Ethinyl Estradiol Triphasic (TRINESSA, 28,) 0.18/0.215/0.25 MG-35 MCG tablet Take 1 tablet by mouth daily.   11/25/2015 at Unknown time  . pioglitazone (ACTOS) 15 MG tablet Take 15 mg by mouth daily.   11/25/2015 at Unknown time  . simvastatin (ZOCOR) 20 MG tablet Take 20 mg by mouth at bedtime.  11/25/2015 at Unknown time  . sitaGLIPtin-metformin (JANUMET) 50-1000 MG per tablet Take 1 tablet by mouth 2 (two) times daily with a meal.   11/25/2015 at Unknown time  . valACYclovir (VALTREX) 1000 MG tablet Take 1 tablet (1,000 mg total) by mouth 2 (two) times daily. Take for ten days. 20 tablet 3     Review of Systems  Constitutional: Negative for fever and chills.  Gastrointestinal: Positive for abdominal pain and diarrhea. Negative for nausea and vomiting.  Genitourinary: Negative for dysuria.   Musculoskeletal: Negative for back pain.   Physical Exam   Blood pressure 113/82, pulse 85, temperature 98.2 F (36.8 C), temperature source Oral, resp. rate 18, height 5\' 7"  (1.702 m), weight 251 lb 12.8 oz (114.216 kg), last menstrual period 03/03/2016.  Physical Exam  Nursing note and vitals reviewed. Constitutional: She  is oriented to person, place, and time. She appears well-developed and well-nourished. No distress.  HENT:  Head: Normocephalic.  Eyes: Pupils are equal, round, and reactive to light.  Neck: Normal range of motion. Neck supple.  Cardiovascular: Normal rate.   Respiratory: Effort normal.  GI: Soft. She exhibits no distension. There is no tenderness. There is no rebound.  Genitourinary:  Mod amount of  Clumpy white/green discharge in vault; cervix parous with multiple Nabothian cysts; uterus retroverted NSSC NT; adnexa without palpable enlargement or tenderness  Musculoskeletal: Normal range of motion.  Neurological: She is alert and oriented to person, place, and time.  Skin: Skin is warm.    MAU Course  Procedures Results for orders placed or performed during the hospital encounter of 03/12/16 (from the past 24 hour(s))  Urinalysis, Routine w reflex microscopic (not at [facility])     Status: Abnormal   Collection Time: 03/12/16  3:10 PM  Result Value Ref Range   Color, Urine YELLOW YELLOW   APPearance CLEAR CLEAR   Specific Gravity, Urine >1.030 (H) 1.005 - 1.030   pH 5.5 5.0 - 8.0   Glucose, UA NEGATIVE NEGATIVE mg/dL   Hgb urine dipstick NEGATIVE NEGATIVE   Bilirubin Urine NEGATIVE NEGATIVE   Ketones, ur 15 (A) NEGATIVE mg/dL   Protein, ur NEGATIVE NEGATIVE mg/dL   Nitrite NEGATIVE NEGATIVE   Leukocytes, UA SMALL (A) NEGATIVE  Urine microscopic-add on     Status: Abnormal   Collection Time: 03/12/16  3:10 PM  Result Value Ref Range   Squamous Epithelial / LPF 6-30 (A) NONE SEEN   WBC, UA 6-30 0 - 5 WBC/hpf   RBC / HPF NONE SEEN 0 - 5 RBC/hpf   Bacteria, UA MANY (A) NONE SEEN  Pregnancy, urine POC     Status: None   Collection Time: 03/12/16  3:20 PM  Result Value Ref Range   Preg Test, Ur NEGATIVE NEGATIVE  Wet prep, genital     Status: Abnormal   Collection Time: 03/12/16  5:15 PM  Result Value Ref Range   Yeast Wet Prep HPF POC NONE SEEN NONE SEEN   Trich, Wet Prep NONE  SEEN NONE SEEN   Clue Cells Wet Prep HPF POC NONE SEEN NONE SEEN   WBC, Wet Prep HPF POC MANY (A) NONE SEEN   Sperm NONE SEEN    GC/chlamydia  Assessment and Plan  Yeast vaginitis clinically- diflucan 150mg  #2 one now and repeat in 72 hours. F/u with PCP  Eivin Mascio 03/12/2016, 4:51 PM

## 2016-03-12 NOTE — MAU Note (Signed)
Pt reports she has had a yellow discharge for 2-3 weeks. Had a refill on her metrogel and tried that but it has not helped. Stared having abd pain last night. No pain today.

## 2016-03-13 LAB — GC/CHLAMYDIA PROBE AMP (~~LOC~~) NOT AT ARMC
Chlamydia: NEGATIVE
Neisseria Gonorrhea: POSITIVE — AB

## 2016-03-17 ENCOUNTER — Ambulatory Visit (INDEPENDENT_AMBULATORY_CARE_PROVIDER_SITE_OTHER): Payer: Medicaid Other

## 2016-03-17 VITALS — Wt 243.5 lb

## 2016-03-17 DIAGNOSIS — A549 Gonococcal infection, unspecified: Secondary | ICD-10-CM

## 2016-03-17 DIAGNOSIS — A64 Unspecified sexually transmitted disease: Secondary | ICD-10-CM

## 2016-03-17 MED ORDER — CEFTRIAXONE SODIUM 250 MG IJ SOLR: 250.0000 mg | Freq: Once | INTRAMUSCULAR | Status: DC

## 2016-03-17 MED ORDER — AZITHROMYCIN 250 MG PO TABS
1000.0000 mg | ORAL_TABLET | Freq: Once | ORAL | Status: DC
Start: 2016-03-17 — End: 2016-04-27

## 2016-03-17 NOTE — Progress Notes (Signed)
Pt comes into clinic for STI treatment. Pt tested positive for Gonorrhea she was given Rocephin 250 mg and Zithromax 1 gm. Pt tolerated well.

## 2016-03-27 ENCOUNTER — Ambulatory Visit: Payer: Medicaid Other | Admitting: Skilled Nursing Facility1

## 2016-04-02 ENCOUNTER — Ambulatory Visit: Payer: Medicaid Other | Admitting: Skilled Nursing Facility1

## 2016-04-27 ENCOUNTER — Inpatient Hospital Stay (HOSPITAL_COMMUNITY)
Admission: AD | Admit: 2016-04-27 | Discharge: 2016-04-27 | Disposition: A | Discharge status: Home / Self Care | Payer: Medicaid Other | Source: Ambulatory Visit | Attending: Family Medicine | Admitting: Family Medicine

## 2016-04-27 ENCOUNTER — Encounter (HOSPITAL_COMMUNITY): Payer: Self-pay | Admitting: *Deleted

## 2016-04-27 DIAGNOSIS — A499 Bacterial infection, unspecified: Secondary | ICD-10-CM | POA: Diagnosis not present

## 2016-04-27 DIAGNOSIS — Z87891 Personal history of nicotine dependence: Secondary | ICD-10-CM | POA: Diagnosis not present

## 2016-04-27 DIAGNOSIS — Z794 Long term (current) use of insulin: Secondary | ICD-10-CM | POA: Insufficient documentation

## 2016-04-27 DIAGNOSIS — E119 Type 2 diabetes mellitus without complications: Secondary | ICD-10-CM | POA: Insufficient documentation

## 2016-04-27 DIAGNOSIS — N898 Other specified noninflammatory disorders of vagina: Secondary | ICD-10-CM | POA: Diagnosis present

## 2016-04-27 DIAGNOSIS — N760 Acute vaginitis: Secondary | ICD-10-CM

## 2016-04-27 DIAGNOSIS — Z113 Encounter for screening for infections with a predominantly sexual mode of transmission: Secondary | ICD-10-CM

## 2016-04-27 DIAGNOSIS — B9689 Other specified bacterial agents as the cause of diseases classified elsewhere: Secondary | ICD-10-CM | POA: Diagnosis not present

## 2016-04-27 DIAGNOSIS — E7800 Pure hypercholesterolemia, unspecified: Secondary | ICD-10-CM | POA: Insufficient documentation

## 2016-04-27 DIAGNOSIS — Z79899 Other long term (current) drug therapy: Secondary | ICD-10-CM | POA: Diagnosis not present

## 2016-04-27 LAB — WET PREP, GENITAL
SPERM: NONE SEEN
TRICH WET PREP: NONE SEEN
Yeast Wet Prep HPF POC: NONE SEEN

## 2016-04-27 LAB — CBC
HEMATOCRIT: 36 % (ref 36.0–46.0)
HEMOGLOBIN: 11.9 g/dL — AB (ref 12.0–15.0)
MCH: 28.7 pg (ref 26.0–34.0)
MCHC: 33.1 g/dL (ref 30.0–36.0)
MCV: 87 fL (ref 78.0–100.0)
Platelets: 228 10*3/uL (ref 150–400)
RBC: 4.14 MIL/uL (ref 3.87–5.11)
RDW: 13.1 % (ref 11.5–15.5)
WBC: 5.6 10*3/uL (ref 4.0–10.5)

## 2016-04-27 LAB — URINALYSIS, ROUTINE W REFLEX MICROSCOPIC
BILIRUBIN URINE: NEGATIVE
GLUCOSE, UA: NEGATIVE mg/dL
HGB URINE DIPSTICK: NEGATIVE
KETONES UR: NEGATIVE mg/dL
LEUKOCYTES UA: NEGATIVE
Nitrite: NEGATIVE
PH: 5.5 (ref 5.0–8.0)
PROTEIN: NEGATIVE mg/dL
Specific Gravity, Urine: 1.025 (ref 1.005–1.030)

## 2016-04-27 LAB — POCT PREGNANCY, URINE: Preg Test, Ur: NEGATIVE

## 2016-04-27 MED ORDER — METRONIDAZOLE 500 MG PO TABS: 500.0000 mg | ORAL_TABLET | Freq: Two times a day (BID) | ORAL | Status: DC

## 2016-04-27 NOTE — Discharge Instructions (Signed)
Vaginitis Vaginitis is an inflammation of the vagina. It is most often caused by a change in the normal balance of the bacteria and yeast that live in the vagina. This change in balance causes an overgrowth of certain bacteria or yeast, which causes the inflammation. There are different types of vaginitis, but the most common types are:  Bacterial vaginosis.  Yeast infection (candidiasis).  Trichomoniasis vaginitis. This is a sexually transmitted infection (STI).  Viral vaginitis.  Atrophic vaginitis.  Allergic vaginitis. CAUSES  The cause depends on the type of vaginitis. Vaginitis can be caused by:  Bacteria (bacterial vaginosis).  Yeast (yeast infection).  A parasite (trichomoniasis vaginitis)  A virus (viral vaginitis).  Low hormone levels (atrophic vaginitis). Low hormone levels can occur during pregnancy, breastfeeding, or after menopause.  Irritants, such as bubble baths, scented tampons, and feminine sprays (allergic vaginitis). Other factors can change the normal balance of the yeast and bacteria that live in the vagina. These include:  Antibiotic medicines.  Poor hygiene.  Diaphragms, vaginal sponges, spermicides, birth control pills, and intrauterine devices (IUD).  Sexual intercourse.  Infection.  Uncontrolled diabetes.  A weakened immune system. SYMPTOMS  Symptoms can vary depending on the cause of the vaginitis. Common symptoms include:  Abnormal vaginal discharge.  The discharge is white, gray, or yellow with bacterial vaginosis.  The discharge is thick, white, and cheesy with a yeast infection.  The discharge is frothy and yellow or greenish with trichomoniasis.  A bad vaginal odor.  The odor is fishy with bacterial vaginosis.  Vaginal itching, pain, or swelling.  Painful intercourse.  Pain or burning when urinating. Sometimes, there are no symptoms. TREATMENT  Treatment will vary depending on the type of infection.   Bacterial  vaginosis and trichomoniasis are often treated with antibiotic creams or pills.  Yeast infections are often treated with antifungal medicines, such as vaginal creams or suppositories.  Viral vaginitis has no cure, but symptoms can be treated with medicines that relieve discomfort. Your sexual partner should be treated as well.  Atrophic vaginitis may be treated with an estrogen cream, pill, suppository, or vaginal ring. If vaginal dryness occurs, lubricants and moisturizing creams may help. You may be told to avoid scented soaps, sprays, or douches.  Allergic vaginitis treatment involves quitting the use of the product that is causing the problem. Vaginal creams can be used to treat the symptoms. HOME CARE INSTRUCTIONS   Take all medicines as directed by your caregiver.  Keep your genital area clean and dry. Avoid soap and only rinse the area with water.  Avoid douching. It can remove the healthy bacteria in the vagina.  Do not use tampons or have sexual intercourse until your vaginitis has been treated. Use sanitary pads while you have vaginitis.  Wipe from front to back. This avoids the spread of bacteria from the rectum to the vagina.  Let air reach your genital area.  Wear cotton underwear to decrease moisture buildup.  Avoid wearing underwear while you sleep until your vaginitis is gone.  Avoid tight pants and underwear or nylons without a cotton panel.  Take off wet clothing (especially bathing suits) as soon as possible.  Use mild, non-scented products. Avoid using irritants, such as:  Scented feminine sprays.  Fabric softeners.  Scented detergents.  Scented tampons.  Scented soaps or bubble baths.  Practice safe sex and use condoms. Condoms may prevent the spread of trichomoniasis and viral vaginitis. SEEK MEDICAL CARE IF:   You have abdominal pain.  You  have a fever or persistent symptoms for more than 2-3 days.  You have a fever and your symptoms suddenly  get worse.   This information is not intended to replace advice given to you by your health care provider. Make sure you discuss any questions you have with your health care provider.   Document Released: 09/13/2007 Document Revised: 04/02/2015 Document Reviewed: 04/28/2012 Elsevier Interactive Patient Education 2016 Elsevier Inc. Bacterial Vaginosis Bacterial vaginosis is a vaginal infection that occurs when the normal balance of bacteria in the vagina is disrupted. It results from an overgrowth of certain bacteria. This is the most common vaginal infection in women of childbearing age. Treatment is important to prevent complications, especially in pregnant women, as it can cause a premature delivery. CAUSES  Bacterial vaginosis is caused by an increase in harmful bacteria that are normally present in smaller amounts in the vagina. Several different kinds of bacteria can cause bacterial vaginosis. However, the reason that the condition develops is not fully understood. RISK FACTORS Certain activities or behaviors can put you at an increased risk of developing bacterial vaginosis, including:  Having a new sex partner or multiple sex partners.  Douching.  Using an intrauterine device (IUD) for contraception. Women do not get bacterial vaginosis from toilet seats, bedding, swimming pools, or contact with objects around them. SIGNS AND SYMPTOMS  Some women with bacterial vaginosis have no signs or symptoms. Common symptoms include:  Grey vaginal discharge.  A fishlike odor with discharge, especially after sexual intercourse.  Itching or burning of the vagina and vulva.  Burning or pain with urination. DIAGNOSIS  Your health care provider will take a medical history and examine the vagina for signs of bacterial vaginosis. A sample of vaginal fluid may be taken. Your health care provider will look at this sample under a microscope to check for bacteria and abnormal cells. A vaginal pH test  may also be done.  TREATMENT  Bacterial vaginosis may be treated with antibiotic medicines. These may be given in the form of a pill or a vaginal cream. A second round of antibiotics may be prescribed if the condition comes back after treatment. Because bacterial vaginosis increases your risk for sexually transmitted diseases, getting treated can help reduce your risk for chlamydia, gonorrhea, HIV, and herpes. HOME CARE INSTRUCTIONS   Only take over-the-counter or prescription medicines as directed by your health care provider.  If antibiotic medicine was prescribed, take it as directed. Make sure you finish it even if you start to feel better.  Tell all sexual partners that you have a vaginal infection. They should see their health care provider and be treated if they have problems, such as a mild rash or itching.  During treatment, it is important that you follow these instructions:  Avoid sexual activity or use condoms correctly.  Do not douche.  Avoid alcohol as directed by your health care provider.  Avoid breastfeeding as directed by your health care provider. SEEK MEDICAL CARE IF:   Your symptoms are not improving after 3 days of treatment.  You have increased discharge or pain.  You have a fever. MAKE SURE YOU:   Understand these instructions.  Will watch your condition.  Will get help right away if you are not doing well or get worse. FOR MORE INFORMATION  Centers for Disease Control and Prevention, Division of STD Prevention: [employer]raud[employer] American Sexual Health Association (ASHA): www.ashastd.org    This information is not intended to replace advice given to you by  your health care provider. Make sure you discuss any questions you have with your health care provider.   Document Released: 11/16/2005 Document Revised: 12/07/2014 Document Reviewed: 06/28/2013 Elsevier Interactive Patient Education [employer].

## 2016-04-27 NOTE — MAU Provider Note (Signed)
History     CSN: [id number]  Arrival date and time: 04/27/16 1020   First Provider Initiated Contact with Patient 04/27/16 1149       Chief Complaint  Patient presents with  . Vaginal Discharge   HPI  Tanya Austin is a 45 y.o. female who presents for vaginal discharge. Symptoms began a weeks ago. Reports thin white vaginal discharge. No odor. Does reports vaginal itching & irritation.  Denies abdominal pain, vaginal bleeding, fever.  Desire STD testing today.   OB History    Gravida Para Term Preterm AB TAB SAB Ectopic Multiple Living   4 4 4  0 0 0 0 0 0 4      Past Medical History  Diagnosis Date  . Diabetes mellitus   . High cholesterol     Past Surgical History  Procedure Laterality Date  . Cervical biopsy      History reviewed. No pertinent family history.  Social History  Substance Use Topics  . Smoking status: Former Smoker    Types: Cigarettes    Quit date: 06/09/2000  . Smokeless tobacco: None  . Alcohol Use: Yes     Comment: Socially on weekends    Allergies: No Known Allergies  Prescriptions prior to admission  Medication Sig Dispense Refill Last Dose  . acetaminophen (TYLENOL) 500 MG tablet Take 1,000 mg by mouth 2 (two) times daily as needed for moderate pain.   03/11/2016 at Unknown time  . azithromycin (ZITHROMAX) 250 MG tablet Take 4 tablets (1,000 mg total) by mouth once. 4 tablet 0   . cefTRIAXone (ROCEPHIN) 250 MG injection Inject 250 mg into the muscle once.  FOR IM use in LARGE MUSCLE MASS 1 each 0   . cyclobenzaprine (FLEXERIL) 10 MG tablet Take 1 tablet (10 mg total) by mouth 3 (three) times daily as needed for muscle spasms. (Patient not taking: Reported on 03/12/2016) 5 tablet 0   . fish oil-omega-3 fatty acids 1000 MG capsule Take 1 g by mouth daily.   03/12/2016 at Unknown time  . fluconazole (DIFLUCAN) 150 MG tablet Take 1 tablet (150 mg total) by mouth once. 2 tablet 3   . Insulin Glargine (TOUJEO SOLOSTAR) 300 UNIT/ML SOPN  Inject 14 Units into the skin at bedtime.   03/11/2016 at Unknown time  . IRON PO Take 1 tablet by mouth daily.   03/12/2016 at Unknown time  . Norgestimate-Ethinyl Estradiol Triphasic (TRINESSA, 28,) 0.18/0.215/0.25 MG-35 MCG tablet Take 1 tablet by mouth daily.   03/12/2016 at Unknown time  . pioglitazone (ACTOS) 30 MG tablet Take 30 mg by mouth daily.  3 03/12/2016 at Unknown time  . Probiotic Product (PROBIOTIC DAILY PO) Take 1 capsule by mouth daily.     . simvastatin (ZOCOR) 20 MG tablet Take 20 mg by mouth at bedtime.    03/11/2016 at Unknown time  . sitaGLIPtin-metformin (JANUMET) 50-1000 MG per tablet Take 1 tablet by mouth 2 (two) times daily with a meal.   03/12/2016 at Unknown time  . valACYclovir (VALTREX) 1000 MG tablet Take 1 tablet (1,000 mg total) by mouth 2 (two) times daily. Take for ten days. (Patient not taking: Reported on 03/12/2016) 20 tablet 3     Review of Systems  Constitutional: Negative.   Gastrointestinal: Negative.   Genitourinary: Negative for dysuria.       + vaginal discharge & irritation   Physical Exam   Blood pressure 122/72, pulse 77, temperature 98 F (36.7 C), temperature source Oral, resp. rate  18, last menstrual period 03/26/2016.  Physical Exam  Nursing note and vitals reviewed. Constitutional: She is oriented to person, place, and time. She appears well-developed and well-nourished. No distress.  HENT:  Head: Normocephalic and atraumatic.  Eyes: Conjunctivae are normal. Right eye exhibits no discharge. Left eye exhibits no discharge. No scleral icterus.  Neck: Normal range of motion.  Respiratory: Effort normal. No respiratory distress.  GI: Soft. There is no tenderness.  Genitourinary: Cervix exhibits friability. Cervix exhibits no motion tenderness. No bleeding in the vagina. Vaginal discharge (minimal amount of thin white discharge) found.  Neurological: She is alert and oriented to person, place, and time.  Skin: Skin is warm and dry. She is  not diaphoretic.  Psychiatric: She has a normal mood and affect. Her behavior is normal. Judgment and thought content normal.    MAU Course  Procedures Results for orders placed or performed during the hospital encounter of 04/27/16 (from the past 24 hour(s))  Urinalysis, Routine w reflex microscopic (not at [facility])     Status: None   Collection Time: 04/27/16 10:33 AM  Result Value Ref Range   Color, Urine YELLOW YELLOW   APPearance CLEAR CLEAR   Specific Gravity, Urine 1.025 1.005 - 1.030   pH 5.5 5.0 - 8.0   Glucose, UA NEGATIVE NEGATIVE mg/dL   Hgb urine dipstick NEGATIVE NEGATIVE   Bilirubin Urine NEGATIVE NEGATIVE   Ketones, ur NEGATIVE NEGATIVE mg/dL   Protein, ur NEGATIVE NEGATIVE mg/dL   Nitrite NEGATIVE NEGATIVE   Leukocytes, UA NEGATIVE NEGATIVE  Pregnancy, urine POC     Status: None   Collection Time: 04/27/16 10:56 AM  Result Value Ref Range   Preg Test, Ur NEGATIVE NEGATIVE  CBC     Status: Abnormal   Collection Time: 04/27/16 11:58 AM  Result Value Ref Range   WBC 5.6 4.0 - 10.5 K/uL   RBC 4.14 3.87 - 5.11 MIL/uL   Hemoglobin 11.9 (L) 12.0 - 15.0 g/dL   HCT 36.0 36.0 - 46.0 %   MCV 87.0 78.0 - 100.0 fL   MCH 28.7 26.0 - 34.0 pg   MCHC 33.1 30.0 - 36.0 g/dL   RDW 13.1 11.5 - 15.5 %   Platelets 228 150 - 400 K/uL  Wet prep, genital     Status: Abnormal   Collection Time: 04/27/16 12:17 PM  Result Value Ref Range   Yeast Wet Prep HPF POC NONE SEEN NONE SEEN   Trich, Wet Prep NONE SEEN NONE SEEN   Clue Cells Wet Prep HPF POC PRESENT (A) NONE SEEN   WBC, Wet Prep HPF POC FEW (A) NONE SEEN   Sperm NONE SEEN     MDM UPT negative CBC, HIV, RPR, GC/CT, wet prep  Assessment and Plan  A:  1. BV (bacterial vaginosis)   2. Screening for STD (sexually transmitted disease)     P; Discharge home Rx flagyl (no alcohol) Discussed reasons to return STD testing pending  Jorje Guild 04/27/2016, 11:49 AM

## 2016-04-27 NOTE — MAU Note (Signed)
Pt C/O discharge, thinks it might be yeast.  Took two doses of diflucan last week but it has not helped.  Discharge & itching continue.  Denies pain or bleeding.

## 2016-04-28 ENCOUNTER — Encounter: Payer: Medicaid Other | Attending: Family Medicine | Admitting: Skilled Nursing Facility1

## 2016-04-28 ENCOUNTER — Encounter: Payer: Self-pay | Admitting: Skilled Nursing Facility1

## 2016-04-28 VITALS — Ht 67.0 in | Wt 250.0 lb

## 2016-04-28 DIAGNOSIS — E119 Type 2 diabetes mellitus without complications: Secondary | ICD-10-CM | POA: Diagnosis not present

## 2016-04-28 LAB — GC/CHLAMYDIA PROBE AMP (~~LOC~~) NOT AT ARMC
Chlamydia: NEGATIVE
NEISSERIA GONORRHEA: NEGATIVE

## 2016-04-28 LAB — RPR: RPR: NONREACTIVE

## 2016-04-28 LAB — HIV ANTIBODY (ROUTINE TESTING W REFLEX): HIV SCREEN 4TH GENERATION: NONREACTIVE

## 2016-04-28 NOTE — Progress Notes (Signed)
Diabetes Self-Management Education  Visit Type: First/Initial  Appt. Start Time: 2:00 Appt. End Time: 3:30  04/28/2016  [name], identified by name and date of birth, is a 45 y.o. female with a diagnosis of Diabetes: Type 2.   ASSESSMENT  Height 5\' 7"  (1.702 m), weight 250 lb (113.399 kg), last menstrual period 03/26/2016. Body mass index is 39.15 kg/(m^2). Pt states she started Insulin since earlier this year. Pt states 7 weeks ago started walking 5 days a week, and 3 days a week leg lifts and squates. pts states she has not had any regular soda/sweet tea for 2 months and really wants it.     Diabetes Self-Management Education - 04/28/16 1411    Visit Information   Visit Type First/Initial   Initial Visit   Diabetes Type Type 2   Are you currently following a meal plan? No   Are you taking your medications as prescribed? No   Date Diagnosed years ago   Health Coping   How would you rate your overall health? Good   Psychosocial Assessment   Patient Belief/Attitude about Diabetes Afraid   Self-care barriers None   Self-management support Family   Patient Concerns Nutrition/Meal planning   Pre-Education Assessment   Patient understands the diabetes disease and treatment process. Needs Instruction   Patient understands incorporating nutritional management into lifestyle. Needs Instruction   Patient undertands incorporating physical activity into lifestyle. Needs Instruction   Patient understands using medications safely. Needs Instruction   Patient understands monitoring blood glucose, interpreting and using results Needs Instruction   Patient understands prevention, detection, and treatment of acute complications. Needs Instruction   Patient understands prevention, detection, and treatment of chronic complications. Needs Instruction   Patient understands how to develop strategies to address psychosocial issues. Needs Instruction   Patient understands how to develop  strategies to promote health/change behavior. Needs Instruction   Complications   Last HgB A1C per patient/outside source 8.5 %   How often do you check your blood sugar? --  about once a day someimtes skipping   Fasting Blood glucose range (mg/dL) 130-179   Postprandial Blood glucose range (mg/dL) 130-179   Have you had a dilated eye exam in the past 12 months? Yes   Have you had a dental exam in the past 12 months? Yes   Are you checking your feet? No   Dietary Intake   Breakfast oatmeal with [place] sausage and splenda and butter   Snack (morning) string cheese and apple and premier shake   Lunch cabbage, green beans, rotissiere chicken and baked beans   Dinner cabbage, zucihin, squash, rotissier chicken and baked beans   Snack (evening) apple and peanuts   Beverage(s) water   Exercise   Exercise Type Light (walking / raking leaves)   How many days per week to you exercise? 5   How many minutes per day do you exercise? 30   Total minutes per week of exercise 150   Patient Education   Previous Diabetes Education Yes (please comment)  when first diagnosed; nothing retained   Disease state  Definition of diabetes, type 1 and 2, and the diagnosis of diabetes   Nutrition management  Role of diet in the treatment of diabetes and the relationship between the three main macronutrients and blood glucose level;Food label reading, portion sizes and measuring food.;Carbohydrate counting;Reviewed blood glucose goals for pre and post meals and how to evaluate the patients' food intake on their blood glucose level.;Meal timing in  regards to the patients' current diabetes medication.;Information on hints to eating out and maintain blood glucose control.   Physical activity and exercise  Role of exercise on diabetes management, blood pressure control and cardiac health.;Identified with patient nutritional and/or medication changes necessary with exercise.   Monitoring Purpose and frequency of  SMBG.;Taught/discussed recording of test results and interpretation of SMBG.;Yearly dilated eye exam;Daily foot exams;Identified appropriate SMBG and/or A1C goals.   Acute complications Taught treatment of hypoglycemia - the 15 rule.   Chronic complications Dental care;Assessed and discussed foot care and prevention of foot problems;Retinopathy and reason for yearly dilated eye exams   Psychosocial adjustment Worked with patient to identify barriers to care and solutions   Individualized Goals (developed by patient)   Nutrition Follow meal plan discussed;General guidelines for healthy choices and portions discussed;Adjust meds/carbs with exercise as discussed   Physical Activity Exercise 3-5 times per week;30 minutes per day   Monitoring  test blood glucose pre and post meals as discussed   Reducing Risk do foot checks daily;increase portions of nuts and seeds;increase portions of olive oil in diet   Post-Education Assessment   Patient understands the diabetes disease and treatment process. Demonstrates understanding / competency   Patient understands incorporating nutritional management into lifestyle. Demonstrates understanding / competency   Patient undertands incorporating physical activity into lifestyle. Demonstrates understanding / competency   Patient understands using medications safely. Demonstrates understanding / competency   Patient understands monitoring blood glucose, interpreting and using results Demonstrates understanding / competency   Patient understands prevention, detection, and treatment of acute complications. Demonstrates understanding / competency   Patient understands prevention, detection, and treatment of chronic complications. Demonstrates understanding / competency   Patient understands how to develop strategies to address psychosocial issues. Demonstrates understanding / competency   Patient understands how to develop strategies to promote health/change behavior.  Demonstrates understanding / competency   Outcomes   Expected Outcomes Demonstrated interest in learning. Expect positive outcomes   Future DMSE PRN   Program Status Completed      Individualized Plan for Diabetes Self-Management Training:   Learning Objective:  Patient will have a greater understanding of diabetes self-management. Patient education plan is to attend individual and/or group sessions per assessed needs and concerns.   Plan:   There are no Patient Instructions on file for this visit.  Expected Outcomes:  Demonstrated interest in learning. Expect positive outcomes  Education material provided: Living Well with Diabetes, Food label handouts and Meal plan card  If problems or questions, patient to contact team via:  Phone  Future DSME appointment: PRN

## 2016-07-05 ENCOUNTER — Encounter (HOSPITAL_COMMUNITY): Payer: Self-pay | Admitting: *Deleted

## 2016-07-05 ENCOUNTER — Inpatient Hospital Stay (HOSPITAL_COMMUNITY)
Admission: AD | Admit: 2016-07-05 | Discharge: 2016-07-05 | Disposition: A | Discharge status: Home / Self Care | Payer: Medicaid Other | Source: Ambulatory Visit | Attending: Obstetrics & Gynecology | Admitting: Obstetrics & Gynecology

## 2016-07-05 DIAGNOSIS — A499 Bacterial infection, unspecified: Secondary | ICD-10-CM

## 2016-07-05 DIAGNOSIS — B9689 Other specified bacterial agents as the cause of diseases classified elsewhere: Secondary | ICD-10-CM

## 2016-07-05 DIAGNOSIS — Z794 Long term (current) use of insulin: Secondary | ICD-10-CM | POA: Insufficient documentation

## 2016-07-05 DIAGNOSIS — N760 Acute vaginitis: Secondary | ICD-10-CM

## 2016-07-05 DIAGNOSIS — Z87891 Personal history of nicotine dependence: Secondary | ICD-10-CM | POA: Diagnosis not present

## 2016-07-05 DIAGNOSIS — E7800 Pure hypercholesterolemia, unspecified: Secondary | ICD-10-CM | POA: Diagnosis not present

## 2016-07-05 DIAGNOSIS — Z79899 Other long term (current) drug therapy: Secondary | ICD-10-CM | POA: Diagnosis not present

## 2016-07-05 DIAGNOSIS — E119 Type 2 diabetes mellitus without complications: Secondary | ICD-10-CM | POA: Insufficient documentation

## 2016-07-05 DIAGNOSIS — N898 Other specified noninflammatory disorders of vagina: Secondary | ICD-10-CM | POA: Diagnosis present

## 2016-07-05 LAB — URINALYSIS, ROUTINE W REFLEX MICROSCOPIC
BILIRUBIN URINE: NEGATIVE
Glucose, UA: NEGATIVE mg/dL
Hgb urine dipstick: NEGATIVE
KETONES UR: NEGATIVE mg/dL
Leukocytes, UA: NEGATIVE
NITRITE: NEGATIVE
PH: 5.5 (ref 5.0–8.0)
Protein, ur: NEGATIVE mg/dL
Specific Gravity, Urine: 1.025 (ref 1.005–1.030)

## 2016-07-05 LAB — POCT PREGNANCY, URINE: Preg Test, Ur: NEGATIVE

## 2016-07-05 LAB — WET PREP, GENITAL
SPERM: NONE SEEN
TRICH WET PREP: NONE SEEN
Yeast Wet Prep HPF POC: NONE SEEN

## 2016-07-05 MED ORDER — METRONIDAZOLE 500 MG PO TABS: 500.0000 mg | ORAL_TABLET | Freq: Two times a day (BID) | ORAL | 0 refills | Status: DC

## 2016-07-05 NOTE — Discharge Instructions (Signed)
Bacterial Vaginosis Bacterial vaginosis is a vaginal infection that occurs when the normal balance of bacteria in the vagina is disrupted. It results from an overgrowth of certain bacteria. This is the most common vaginal infection in women of childbearing age. Treatment is important to prevent complications, especially in pregnant women, as it can cause a premature delivery. CAUSES  Bacterial vaginosis is caused by an increase in harmful bacteria that are normally present in smaller amounts in the vagina. Several different kinds of bacteria can cause bacterial vaginosis. However, the reason that the condition develops is not fully understood. RISK FACTORS Certain activities or behaviors can put you at an increased risk of developing bacterial vaginosis, including:  Having a new sex partner or multiple sex partners.  Douching.  Using an intrauterine device (IUD) for contraception. Women do not get bacterial vaginosis from toilet seats, bedding, swimming pools, or contact with objects around them. SIGNS AND SYMPTOMS  Some women with bacterial vaginosis have no signs or symptoms. Common symptoms include:  Grey vaginal discharge.  A fishlike odor with discharge, especially after sexual intercourse.  Itching or burning of the vagina and vulva.  Burning or pain with urination. DIAGNOSIS  Your health care provider will take a medical history and examine the vagina for signs of bacterial vaginosis. A sample of vaginal fluid may be taken. Your health care provider will look at this sample under a microscope to check for bacteria and abnormal cells. A vaginal pH test may also be done.  TREATMENT  Bacterial vaginosis may be treated with antibiotic medicines. These may be given in the form of a pill or a vaginal cream. A second round of antibiotics may be prescribed if the condition comes back after treatment. Because bacterial vaginosis increases your risk for sexually transmitted diseases, getting  treated can help reduce your risk for chlamydia, gonorrhea, HIV, and herpes. HOME CARE INSTRUCTIONS   Only take over-the-counter or prescription medicines as directed by your health care provider.  If antibiotic medicine was prescribed, take it as directed. Make sure you finish it even if you start to feel better.  Tell all sexual partners that you have a vaginal infection. They should see their health care provider and be treated if they have problems, such as a mild rash or itching.  During treatment, it is important that you follow these instructions:  Avoid sexual activity or use condoms correctly.  Do not douche.  Avoid alcohol as directed by your health care provider.  Avoid breastfeeding as directed by your health care provider. SEEK MEDICAL CARE IF:   Your symptoms are not improving after 3 days of treatment.  You have increased discharge or pain.  You have a fever. MAKE SURE YOU:   Understand these instructions.  Will watch your condition.  Will get help right away if you are not doing well or get worse. FOR MORE INFORMATION  Centers for Disease Control and Prevention, Division of STD Prevention: AppraiserFraud.[employer] American Sexual Health Association (ASHA): www.ashastd.org    This information is not intended to replace advice given to you by your health care provider. Make sure you discuss any questions you have with your health care provider.   Document Released: 11/16/2005 Document Revised: 12/07/2014 Document Reviewed: 06/28/2013 Elsevier Interactive Patient Education 2016 [place].  *Use daily probiotic with lactobacillus and acidophilus

## 2016-07-05 NOTE — MAU Provider Note (Signed)
History     CSN: [id number]  Arrival date and time: 07/05/16 1038   First Provider Initiated Contact with Patient 07/05/16 1123      Chief Complaint  Patient presents with  . Vaginal Discharge   Non-pregnant female c/o thick, white, odorous vaginal discharge since yesterday. She reports vaginal itching as well. She did not try any OTC tx. No new partner for "months". DM is managed by PCP and she reports recent weight loss and improved A1C.    Pertinent Gynecological History: Menses: 06/17/16 Contraception: OCP (estrogen/progesterone) Sexually transmitted diseases: past history: HSV  Past Medical History:  Diagnosis Date  . Diabetes mellitus   . High cholesterol     Past Surgical History:  Procedure Laterality Date  . CERVICAL BIOPSY      Family History  Problem Relation Age of Onset  . Hypertension Other   . Hyperlipidemia Other   . Heart disease Other     Social History  Substance Use Topics  . Smoking status: Former Smoker    Types: Cigarettes    Quit date: 06/09/2000  . Smokeless tobacco: Never Used  . Alcohol use Yes     Comment: Socially on weekends    Allergies: No Known Allergies  Prescriptions Prior to Admission  Medication Sig Dispense Refill Last Dose  . acetaminophen (TYLENOL) 500 MG tablet Take 1,000 mg by mouth 2 (two) times daily as needed for moderate pain.   Past Month at Unknown time  . fish oil-omega-3 fatty acids 1000 MG capsule Take 1 g by mouth daily.   07/05/2016 at Unknown time  . Insulin Glargine (TOUJEO SOLOSTAR) 300 UNIT/ML SOPN Inject 14 Units into the skin at bedtime.   07/04/2016 at Unknown time  . IRON PO Take 1 tablet by mouth daily.   07/05/2016 at Unknown time  . Norgestimate-Ethinyl Estradiol Triphasic (TRINESSA, 28,) 0.18/0.215/0.25 MG-35 MCG tablet Take 1 tablet by mouth daily.   07/05/2016 at Unknown time  . pioglitazone (ACTOS) 30 MG tablet Take 30 mg by mouth daily.  3 07/05/2016 at Unknown time  . Probiotic Product (PROBIOTIC  DAILY PO) Take 1 capsule by mouth daily.   07/05/2016 at Unknown time  . simvastatin (ZOCOR) 20 MG tablet Take 20 mg by mouth at bedtime.    07/04/2016 at Unknown time  . sitaGLIPtin-metformin (JANUMET) 50-1000 MG per tablet Take 1 tablet by mouth 2 (two) times daily with a meal.   07/05/2016 at Unknown time  . metroNIDAZOLE (FLAGYL) 500 MG tablet Take 1 tablet (500 mg total) by mouth 2 (two) times daily. (Patient not taking: Reported on 07/05/2016) 14 tablet 0 Completed Course at Unknown time    Review of Systems  Constitutional: Negative.   Gastrointestinal: Negative.   Genitourinary: Negative.    Physical Exam   Blood pressure 103/58, pulse 71, temperature 97.6 F (36.4 C), temperature source Oral, resp. rate 18, last menstrual period 06/17/2016.  Physical Exam  Constitutional: She is oriented to person, place, and time. She appears well-developed and well-nourished.  HENT:  Head: Normocephalic and atraumatic.  Neck: Normal range of motion.  Cardiovascular: Normal rate.   Respiratory: Effort normal.  GI: Soft. She exhibits no distension. There is no tenderness.  Genitourinary: Vagina normal.  Genitourinary Comments: External: No lesions Vagina: rugated, parous, moderate thick creamy discharge Uterus: non-enlarged, anteverted, non-tender, no CMT Adnexae: no masses, no tenderness left, no tenderness right   Musculoskeletal: Normal range of motion.  Neurological: She is alert and oriented to person, place, and time.  Skin: Skin is warm and dry.  Psychiatric: She has a normal mood and affect.   Results for orders placed or performed during the hospital encounter of 07/05/16 (from the past 24 hour(s))  Urinalysis, Routine w reflex microscopic (not at [facility])     Status: None   Collection Time: 07/05/16 10:50 AM  Result Value Ref Range   Color, Urine YELLOW YELLOW   APPearance CLEAR CLEAR   Specific Gravity, Urine 1.025 1.005 - 1.030   pH 5.5 5.0 - 8.0   Glucose, UA NEGATIVE NEGATIVE  mg/dL   Hgb urine dipstick NEGATIVE NEGATIVE   Bilirubin Urine NEGATIVE NEGATIVE   Ketones, ur NEGATIVE NEGATIVE mg/dL   Protein, ur NEGATIVE NEGATIVE mg/dL   Nitrite NEGATIVE NEGATIVE   Leukocytes, UA NEGATIVE NEGATIVE  Pregnancy, urine POC     Status: None   Collection Time: 07/05/16 11:12 AM  Result Value Ref Range   Preg Test, Ur NEGATIVE NEGATIVE  Wet prep, genital     Status: Abnormal   Collection Time: 07/05/16 11:30 AM  Result Value Ref Range   Yeast Wet Prep HPF POC NONE SEEN NONE SEEN   Trich, Wet Prep NONE SEEN NONE SEEN   Clue Cells Wet Prep HPF POC PRESENT (A) NONE SEEN   WBC, Wet Prep HPF POC FEW (A) NONE SEEN   Sperm NONE SEEN     MAU Course  Procedures  MDM Labs ordered and reviewed. No evidence of PID. Will treat for BV. Stable for discharge home.  Assessment and Plan   1. Bacterial vaginosis    Discharge home Flagyl 500 mg po bid x7 days Increase water intake Abstain from IC until sx resolved Start daily probiotic with lactobacillus and acidophilus Follow up in [place] for routine care or prn Discussed non-emergent visits are more appt for clinic setting  Julianne Handler, CNM 07/05/2016, 11:36 AM

## 2016-07-05 NOTE — MAU Note (Signed)
Pt states she is having vaginal discharge that started yesterday.  Pt states she is having some itching.

## 2016-07-06 LAB — GC/CHLAMYDIA PROBE AMP (~~LOC~~) NOT AT ARMC
CHLAMYDIA, DNA PROBE: NEGATIVE
Neisseria Gonorrhea: NEGATIVE

## 2016-08-04 ENCOUNTER — Ambulatory Visit (INDEPENDENT_AMBULATORY_CARE_PROVIDER_SITE_OTHER): Payer: Medicaid Other | Admitting: Family Medicine

## 2016-08-04 ENCOUNTER — Encounter: Payer: Self-pay | Admitting: Family Medicine

## 2016-08-04 ENCOUNTER — Other Ambulatory Visit (HOSPITAL_COMMUNITY)
Admission: RE | Admit: 2016-08-04 | Discharge: 2016-08-04 | Disposition: A | Discharge status: Home / Self Care | Payer: Medicaid Other | Source: Ambulatory Visit | Attending: Family Medicine | Admitting: Family Medicine

## 2016-08-04 VITALS — BP 128/61 | HR 70 | Wt 239.4 lb

## 2016-08-04 DIAGNOSIS — Z124 Encounter for screening for malignant neoplasm of cervix: Secondary | ICD-10-CM | POA: Diagnosis not present

## 2016-08-04 DIAGNOSIS — Z01411 Encounter for gynecological examination (general) (routine) with abnormal findings: Secondary | ICD-10-CM | POA: Insufficient documentation

## 2016-08-04 DIAGNOSIS — A499 Bacterial infection, unspecified: Secondary | ICD-10-CM | POA: Diagnosis not present

## 2016-08-04 DIAGNOSIS — N760 Acute vaginitis: Secondary | ICD-10-CM

## 2016-08-04 DIAGNOSIS — Z1151 Encounter for screening for human papillomavirus (HPV): Secondary | ICD-10-CM | POA: Insufficient documentation

## 2016-08-04 DIAGNOSIS — B9689 Other specified bacterial agents as the cause of diseases classified elsewhere: Secondary | ICD-10-CM | POA: Insufficient documentation

## 2016-08-04 MED ORDER — METRONIDAZOLE 0.75 % VA GEL: 1.0000 | VAGINAL | Status: DC

## 2016-08-04 MED ORDER — METRONIDAZOLE 500 MG PO TABS: 500.0000 mg | ORAL_TABLET | Freq: Two times a day (BID) | ORAL | 0 refills | Status: DC

## 2016-08-04 NOTE — Patient Instructions (Signed)

## 2016-08-04 NOTE — Progress Notes (Signed)
  CLINIC ENCOUNTER NOTE  History:  45 y.o. [id number] here today for recurrent BV infections.  Vaginitis Patient presents for evaluation of an abnormal vaginal discharge. Symptoms have been present for 2 weeks. Vaginal symptoms: discharge described as white and malodorous, odor and vulvar itching. Contraception: condoms. She denies abnormal bleeding, blisters, bumps and pain. Sexually transmitted infection risk: possible STD exposure. Menstrual flow: regular every 28-30 days.    Patient has tried changing soaps (non-frangrant now), changed detergents (non-fragrant now), changed underwear ("breathable"). Still having intercourse. Denies douching. Has been treated almost monthly, has been treated with PO flagyl every time. Diabetes is now more controlled. Weight has decreased.   Past Medical History:  Diagnosis Date  . Diabetes mellitus   . High cholesterol     Past Surgical History:  Procedure Laterality Date  . CERVICAL BIOPSY      The following portions of the patient's history were reviewed and updated as appropriate: allergies, current medications, past family history, past medical history, past social history, past surgical history and problem list.   Health Maintenance:  Unknown last pap.  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    Review of Systems:  See above; comprehensive review of systems was otherwise negative.  Objective:  Physical Exam BP 128/61 (BP Location: Right Arm, Patient Position: Sitting, Cuff Size: Large)   Pulse 70   Wt 239 lb 6.4 oz (108.6 kg)   LMP 07/18/2016   BMI 37.50 kg/m  CONSTITUTIONAL: Well-developed, well-nourished female in no acute distress. Morbidly obese.  HENT:  Normocephalic, atraumatic SKIN: Skin is warm and dry.  [employer]: Alert  PSYCHIATRIC: Normal mood and affect.  CARDIOVASCULAR: Normal heart rate noted RESPIRATORY: Effort and breath sounds normal, no problems with respiration noted ABDOMEN: Soft, no distention noted.  No tenderness, rebound  or guarding.  PELVIC: Normal appearing external genitalia; normal appearing vaginal mucosa and cervix.  Normal appearing discharge. Not odorous. Normal uterine size, no other palpable masses, no uterine or adnexal tenderness. Pap collected.   Labs and Imaging No results found.  Assessment & Plan:   1. Bacterial vaginosis - Recurrent BV, will treat with PO and then treat with suppressive therapy. - metroNIDAZOLE (FLAGYL) 500 MG tablet; Take 1 tablet (500 mg total) by mouth 2 (two) times daily.  Dispense: 14 tablet; Refill: 0 - metroNIDAZOLE (METROGEL) 0.75 % vaginal gel 1 Applicatorful; Place 1 Applicatorful vaginally Once per day on Mon Thu.  2. 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     Katherine Basset, DO [facility] for [facility], [place]

## 2016-08-05 LAB — WET PREP, GENITAL
Trich, Wet Prep: NONE SEEN
Yeast Wet Prep HPF POC: NONE SEEN

## 2016-08-06 LAB — CYTOLOGY - PAP

## 2016-08-07 ENCOUNTER — Telehealth: Payer: Self-pay | Admitting: General Practice

## 2016-08-07 DIAGNOSIS — N760 Acute vaginitis: Principal | ICD-10-CM

## 2016-08-07 DIAGNOSIS — B9689 Other specified bacterial agents as the cause of diseases classified elsewhere: Secondary | ICD-10-CM

## 2016-08-07 MED ORDER — METRONIDAZOLE 0.75 % VA GEL
1.0000 | VAGINAL | 0 refills | Status: DC
Start: 2016-08-10 — End: 2016-09-09

## 2016-08-07 NOTE — Telephone Encounter (Signed)
Patient called and left message stating she was seen here Tuesday and was suppose to receive two prescriptions. Patient states only one has been sent in, she is missing the metrogel Rx. Per chart review, med was ordered incorrectly. Reordered med. Called patient & informed her of new rx sent to pharmacy. Patient verbalized understanding & asked if her results were back. Told patient her wet prep is the only thing that has come back & it shows BV. Patient verbalized understanding & had no questions

## 2016-08-25 ENCOUNTER — Encounter: Payer: Self-pay | Admitting: *Deleted

## 2016-08-25 LAB — PROCEDURE REPORT - SCANNED: Pap: NEGATIVE

## 2016-08-31 ENCOUNTER — Ambulatory Visit: Payer: Medicaid Other | Admitting: Certified Nurse Midwife

## 2016-09-09 ENCOUNTER — Encounter: Payer: Self-pay | Admitting: Family Medicine

## 2016-09-09 ENCOUNTER — Ambulatory Visit (INDEPENDENT_AMBULATORY_CARE_PROVIDER_SITE_OTHER): Payer: Medicaid Other | Admitting: Family Medicine

## 2016-09-09 ENCOUNTER — Other Ambulatory Visit (HOSPITAL_COMMUNITY)
Admission: RE | Admit: 2016-09-09 | Discharge: 2016-09-09 | Disposition: A | Discharge status: Home / Self Care | Payer: Medicaid Other | Source: Ambulatory Visit | Attending: Family Medicine | Admitting: Family Medicine

## 2016-09-09 VITALS — BP 122/67 | HR 72 | Ht 66.0 in | Wt 235.0 lb

## 2016-09-09 DIAGNOSIS — N760 Acute vaginitis: Secondary | ICD-10-CM

## 2016-09-09 NOTE — Patient Instructions (Signed)

## 2016-09-09 NOTE — Progress Notes (Signed)
CLINIC ENCOUNTER NOTE  History:  45 y.o. [id number] here today for recurrent BV infections.  Patient was seen a little over a month previously. She has had MULTIPLE +BV cultures, and has failed treatment every time with recurrent BV infections. She has been using condoms without relief. She has tried PO Flagyl, Metrogel, and Clinda in the past without relief. She tried recently a course of treatment then suppressive therapy twice weekly. No resolve. BV infection tends to come back within a few days. Her complaints are mainly itching both vulvar and intravaginally, including odor and discharge.   Past Medical History:  Diagnosis Date  . Diabetes mellitus   . High cholesterol     Past Surgical History:  Procedure Laterality Date  . CERVICAL BIOPSY      The following portions of the patient's history were reviewed and updated as appropriate: allergies, current medications, past family history, past medical history, past social history, past surgical history and problem list.   Health Maintenance:  Normal pap and negative HRHPV on 08/04/16.  Mammogram has not been performed.   Review of Systems:  See above; comprehensive review of systems was otherwise negative.  Objective:  Physical Exam BP 122/67   Pulse 72   Ht 5\' 6"  (1.676 m)   Wt 235 lb (106.6 kg)   LMP 08/26/2016 (Exact Date)   BMI 37.93 kg/m  CONSTITUTIONAL: Well-developed, well-nourished female in no acute distress. Obese. HENT:  Normocephalic, atraumatic SKIN: Skin is warm and dry.  [place]: Alert  PSYCHIATRIC: Normal mood and affect.  CARDIOVASCULAR: Normal heart rate noted RESPIRATORY: Effort and breath sounds normal, no problems with respiration noted ABDOMEN: Soft, no distention noted.  No tenderness, rebound or guarding.  PELVIC:  Normal appearing external genitalia with some mild ; normal appearing vaginal mucosa and cervix, slightly dry.  Normal appearing discharge.  Normal uterine size, no other palpable masses,  no uterine or adnexal tenderness.   Labs and Imaging No results found.  Assessment & Plan:   1. Recurrent vaginitis - Failed outpatient treatment with suppression treatment - Wet prep, genital - Cervicovaginal ancillary only - Will call with results and new plan once AFFIRM test is back. - Needs to continue suppressive treatment for months before officially fails suppression therapy.  Routine preventative health maintenance measures emphasized.     Katherine Basset, DO [facility] for [facility], [place]

## 2016-09-10 LAB — WET PREP, GENITAL
Trich, Wet Prep: NONE SEEN
Yeast Wet Prep HPF POC: NONE SEEN

## 2016-09-10 LAB — CERVICOVAGINAL ANCILLARY ONLY: WET PREP (BD AFFIRM): POSITIVE — AB

## 2016-09-15 ENCOUNTER — Telehealth: Payer: Self-pay | Admitting: General Practice

## 2016-09-15 NOTE — Telephone Encounter (Signed)
Spoke with Fatima Blank about patient's history/lab results. She recommends patient do boric acid suppositories every night for 10 nights while continuing daily probiotics and BV reduction techniques previously discussed. Called patient & informed her of recommendations & where to buy Bryson Corona). Patient verbalized understanding to all & had no questions

## 2016-09-15 NOTE — Telephone Encounter (Signed)
Patient called requesting test results. Informed patient wet prep showed BV but I am uncertain how it should be treated as she has tried other methods that have not been helpful. Told patient I will have a provider review her results/chart as Dr Vanetta Shawl isn't here today and I will call her back. Patient states it is a 9 on the irritation scale. Patient had no other questions at this time.

## 2016-10-13 ENCOUNTER — Emergency Department (HOSPITAL_COMMUNITY): Payer: Medicaid Other

## 2016-10-13 ENCOUNTER — Encounter (HOSPITAL_COMMUNITY): Payer: Self-pay

## 2016-10-13 ENCOUNTER — Emergency Department (HOSPITAL_COMMUNITY)
Admission: EM | Admit: 2016-10-13 | Discharge: 2016-10-13 | Disposition: A | Discharge status: Home / Self Care | Payer: Medicaid Other | Attending: Emergency Medicine | Admitting: Emergency Medicine

## 2016-10-13 DIAGNOSIS — G44209 Tension-type headache, unspecified, not intractable: Secondary | ICD-10-CM | POA: Diagnosis not present

## 2016-10-13 DIAGNOSIS — Z7984 Long term (current) use of oral hypoglycemic drugs: Secondary | ICD-10-CM | POA: Diagnosis not present

## 2016-10-13 DIAGNOSIS — N760 Acute vaginitis: Secondary | ICD-10-CM | POA: Insufficient documentation

## 2016-10-13 DIAGNOSIS — R51 Headache: Secondary | ICD-10-CM | POA: Diagnosis present

## 2016-10-13 DIAGNOSIS — E119 Type 2 diabetes mellitus without complications: Secondary | ICD-10-CM | POA: Diagnosis not present

## 2016-10-13 DIAGNOSIS — Z87891 Personal history of nicotine dependence: Secondary | ICD-10-CM | POA: Insufficient documentation

## 2016-10-13 LAB — URINALYSIS, ROUTINE W REFLEX MICROSCOPIC
Bilirubin Urine: NEGATIVE
GLUCOSE, UA: NEGATIVE mg/dL
Hgb urine dipstick: NEGATIVE
KETONES UR: NEGATIVE mg/dL
LEUKOCYTES UA: NEGATIVE
NITRITE: NEGATIVE
PROTEIN: NEGATIVE mg/dL
Specific Gravity, Urine: 1.02 (ref 1.005–1.030)
pH: 6 (ref 5.0–8.0)

## 2016-10-13 LAB — WET PREP, GENITAL
Clue Cells Wet Prep HPF POC: NONE SEEN
Sperm: NONE SEEN
TRICH WET PREP: NONE SEEN
Yeast Wet Prep HPF POC: NONE SEEN

## 2016-10-13 LAB — PREGNANCY, URINE: Preg Test, Ur: NEGATIVE

## 2016-10-13 LAB — CBG MONITORING, ED: GLUCOSE-CAPILLARY: 103 mg/dL — AB (ref 65–99)

## 2016-10-13 MED ORDER — KETOROLAC TROMETHAMINE 30 MG/ML IJ SOLN
30.0000 mg | Freq: Once | INTRAMUSCULAR | Status: AC
  Administered 2016-10-13: 30 mg via INTRAMUSCULAR
  Filled 2016-10-13: qty 1

## 2016-10-13 MED ORDER — METRONIDAZOLE 500 MG PO TABS: 500.0000 mg | ORAL_TABLET | Freq: Two times a day (BID) | ORAL | 0 refills | Status: DC

## 2016-10-13 MED ORDER — IBUPROFEN 600 MG PO TABS: 600.0000 mg | ORAL_TABLET | Freq: Four times a day (QID) | ORAL | 0 refills | Status: DC | PRN

## 2016-10-13 MED ORDER — ONDANSETRON 4 MG PO TBDP
4.0000 mg | ORAL_TABLET | Freq: Once | ORAL | Status: AC
  Administered 2016-10-13: 4 mg via ORAL
  Filled 2016-10-13: qty 1

## 2016-10-13 NOTE — ED Notes (Signed)
Pelvic exam done by Dr. Haviland and Vantasia Pinkney - EMT assisted. °

## 2016-10-13 NOTE — ED Triage Notes (Signed)
Pt reports headache X1 week. Hx of same. Pt denies neck pain but reports intermittent blurred vision. She also reports vaginal d/c X4 days. Pt denies any new sexual partners. She reports discharge is gray and smells "fishy."

## 2016-10-13 NOTE — Discharge Instructions (Signed)
F/U with your dentist for a mouth guard

## 2016-10-13 NOTE — ED Notes (Signed)
Patient transported to CT 

## 2016-10-13 NOTE — ED Provider Notes (Signed)
[facility] DEPT Provider Note   CSN: [id number] Arrival date & time: 10/13/16  1120     History   Chief Complaint Chief Complaint  Patient presents with  . Headache  . Vaginal Discharge    HPI Tanya Austin is a 45 y.o. female.  Pt has had a headache for the past week.  She said that she gets frequent headaches.  Pt said that it's getting hard to sleep at night due to the pain.  Pt also c/o vaginal discharge that has been going on for 4 days.  The pt denies any known exposure to STD.       Past Medical History:  Diagnosis Date  . Diabetes mellitus   . High cholesterol     Patient Active Problem List   Diagnosis Date Noted  . Bacterial vaginosis 08/04/2016    Past Surgical History:  Procedure Laterality Date  . CERVICAL BIOPSY      OB History    Gravida Para Term Preterm AB Living   4 4 4  0 0 4   SAB TAB Ectopic Multiple Live Births   0 0 0 0         Home Medications    Prior to Admission medications   Medication Sig Start Date End Date Taking? Authorizing Provider  acetaminophen (TYLENOL) 500 MG tablet Take 1,000 mg by mouth 2 (two) times daily as needed for moderate pain.    Historical Provider, MD  Cholecalciferol (VITAMIN D3) 3000 units TABS Take by mouth.    Historical Provider, MD  fish oil-omega-3 fatty acids 1000 MG capsule Take 1 g by mouth daily.    Historical Provider, MD  ibuprofen (ADVIL,MOTRIN) 600 MG tablet Take 1 tablet (600 mg total) by mouth every 6 (six) hours as needed. 10/13/16   Isla Pence, MD  Insulin Glargine (TOUJEO SOLOSTAR) 300 UNIT/ML SOPN Inject 14 Units into the skin at bedtime.    Historical Provider, MD  IRON PO Take 1 tablet by mouth daily.    Historical Provider, MD  metroNIDAZOLE (FLAGYL) 500 MG tablet Take 1 tablet (500 mg total) by mouth 2 (two) times daily. 10/13/16   Isla Pence, MD  Norgestimate-Ethinyl Estradiol Triphasic (TRINESSA, 28,) 0.18/0.215/0.25 MG-35 MCG tablet Take 1 tablet by mouth daily.     Historical Provider, MD  pioglitazone (ACTOS) 30 MG tablet Take 30 mg by mouth daily. 02/21/16   Historical Provider, MD  Probiotic Product (PROBIOTIC DAILY PO) Take 1 capsule by mouth daily.    Historical Provider, MD  simvastatin (ZOCOR) 20 MG tablet Take 20 mg by mouth at bedtime.     Historical Provider, MD  sitaGLIPtin-metformin (JANUMET) 50-1000 MG per tablet Take 1 tablet by mouth 2 (two) times daily with a meal.    Historical Provider, MD    Family History Family History  Problem Relation Age of Onset  . Hypertension Other   . Hyperlipidemia Other   . Heart disease Other     Social History Social History  Substance Use Topics  . Smoking status: Former Smoker    Types: Cigarettes    Quit date: 06/09/2000  . Smokeless tobacco: Never Used  . Alcohol use Yes     Comment: Socially on weekends     Allergies   Patient has no known allergies.   Review of Systems Review of Systems  Genitourinary: Positive for vaginal discharge.  Neurological: Positive for headaches.  All other systems reviewed and are negative.    Physical Exam Updated Vital Signs  BP 159/94 (BP Location: Left Arm)   Pulse 78   Temp 98.4 F (36.9 C) (Oral)   Resp 18   Ht 5\' 7"  (1.702 m)   Wt 230 lb (104.3 kg)   LMP 09/13/2016 (Approximate)   SpO2 100%   BMI 36.02 kg/m   Physical Exam  Constitutional: She appears well-developed and well-nourished.  HENT:  Head: Normocephalic and atraumatic.  Right Ear: External ear normal.  Left Ear: External ear normal.  Nose: Nose normal.  Mouth/Throat: Oropharynx is clear and moist.  Eyes: Conjunctivae and EOM are normal. Pupils are equal, round, and reactive to light.  Neck: Normal range of motion. Neck supple.  Cardiovascular: Normal rate, regular rhythm, normal heart sounds and intact distal pulses.   Pulmonary/Chest: Effort normal and breath sounds normal.  Abdominal: Soft. Bowel sounds are normal.  Genitourinary: Uterus normal. Cervix exhibits  discharge. Cervix exhibits no motion tenderness. Right adnexum displays no tenderness. Left adnexum displays no tenderness. Vaginal discharge found.  Musculoskeletal: Normal range of motion.  Neurological: She is alert.  Skin: Skin is warm.  Psychiatric: She has a normal mood and affect. Her behavior is normal. Judgment and thought content normal.  Nursing note and vitals reviewed.    ED Treatments / Results  Labs (all labs ordered are listed, but only abnormal results are displayed) Labs Reviewed  WET PREP, GENITAL - Abnormal; Notable for the following:       Result Value   WBC, Wet Prep HPF POC FEW (*)    All other components within normal limits  CBG MONITORING, ED - Abnormal; Notable for the following:    Glucose-Capillary 103 (*)    All other components within normal limits  URINALYSIS, ROUTINE W REFLEX MICROSCOPIC (NOT AT [facility])  PREGNANCY, URINE  GC/CHLAMYDIA PROBE AMP ([place]) NOT AT [facility]    EKG  EKG Interpretation None       Radiology Ct Head Wo Contrast  Result Date: 10/13/2016 CLINICAL DATA:  Occipital headaches for 1 day.  No injury or trauma. EXAM: CT HEAD WITHOUT CONTRAST TECHNIQUE: Contiguous axial images were obtained from the base of the skull through the vertex without intravenous contrast. COMPARISON:  09/25/2008. FINDINGS: Brain: No evidence of acute infarction, hemorrhage, hydrocephalus, extra-axial collection or mass lesion/mass effect. Normal cerebral volume. No white matter disease. Vascular: No hyperdense vessel or unexpected calcification. Skull: Normal. Negative for fracture or focal lesion. Sinuses/Orbits: No acute finding. Other: None. Compared with priors, similar appearance. IMPRESSION: Negative exam. Electronically Signed   By: Staci Righter M.D.   On: 10/13/2016 14:57    Procedures Procedures (including critical care time)  Medications Ordered in ED Medications  ketorolac (TORADOL) 30 MG/ML injection 30 mg (30 mg Intramuscular Given  10/13/16 1313)  ondansetron (ZOFRAN-ODT) disintegrating tablet 4 mg (4 mg Oral Given 10/13/16 1313)     Initial Impression / Assessment and Plan / ED Course  I have reviewed the triage vital signs and the nursing notes.  Pertinent labs & imaging results that were available during my care of the patient were reviewed by me and considered in my medical decision making (see chart for details).  Clinical Course     Pt notes that she wakes up in the morning grinding her teeth.  She has been under a lot of stress.  I told her to see her dentist for a mouth guard.  She does not want tx for gc or chl.  She will be given flagyl for possible vaginitis.  Final Clinical  Impressions(s) / ED Diagnoses   Final diagnoses:  Acute non intractable tension-type headache  Acute vaginitis    New Prescriptions New Prescriptions   IBUPROFEN (ADVIL,MOTRIN) 600 MG TABLET    Take 1 tablet (600 mg total) by mouth every 6 (six) hours as needed.   METRONIDAZOLE (FLAGYL) 500 MG TABLET    Take 1 tablet (500 mg total) by mouth 2 (two) times daily.     Isla Pence, MD 10/13/16 [phone]

## 2016-10-13 NOTE — ED Notes (Signed)
Pt informed that she is waiting for CT.

## 2016-10-14 LAB — GC/CHLAMYDIA PROBE AMP (~~LOC~~) NOT AT ARMC
CHLAMYDIA, DNA PROBE: NEGATIVE
NEISSERIA GONORRHEA: NEGATIVE

## 2016-11-09 ENCOUNTER — Encounter (HOSPITAL_COMMUNITY): Payer: Self-pay | Admitting: *Deleted

## 2016-11-09 ENCOUNTER — Inpatient Hospital Stay (HOSPITAL_COMMUNITY)
Admission: AD | Admit: 2016-11-09 | Discharge: 2016-11-09 | Disposition: A | Discharge status: Home / Self Care | Payer: Medicaid Other | Source: Ambulatory Visit | Attending: Family Medicine | Admitting: Family Medicine

## 2016-11-09 DIAGNOSIS — Z87891 Personal history of nicotine dependence: Secondary | ICD-10-CM | POA: Insufficient documentation

## 2016-11-09 DIAGNOSIS — N760 Acute vaginitis: Secondary | ICD-10-CM | POA: Diagnosis not present

## 2016-11-09 DIAGNOSIS — B9689 Other specified bacterial agents as the cause of diseases classified elsewhere: Secondary | ICD-10-CM | POA: Diagnosis not present

## 2016-11-09 DIAGNOSIS — Z3202 Encounter for pregnancy test, result negative: Secondary | ICD-10-CM | POA: Diagnosis not present

## 2016-11-09 DIAGNOSIS — N898 Other specified noninflammatory disorders of vagina: Secondary | ICD-10-CM | POA: Diagnosis present

## 2016-11-09 HISTORY — DX: Benign neoplasm of connective and other soft tissue, unspecified: D21.9

## 2016-11-09 HISTORY — DX: Headache: R51

## 2016-11-09 HISTORY — DX: Headache, unspecified: R51.9

## 2016-11-09 LAB — URINALYSIS, ROUTINE W REFLEX MICROSCOPIC
BILIRUBIN URINE: NEGATIVE
Glucose, UA: NEGATIVE mg/dL
HGB URINE DIPSTICK: NEGATIVE
KETONES UR: NEGATIVE mg/dL
NITRITE: NEGATIVE
PH: 5 (ref 5.0–8.0)
Protein, ur: NEGATIVE mg/dL
SPECIFIC GRAVITY, URINE: 1.024 (ref 1.005–1.030)

## 2016-11-09 LAB — WET PREP, GENITAL
SPERM: NONE SEEN
Trich, Wet Prep: NONE SEEN
YEAST WET PREP: NONE SEEN

## 2016-11-09 LAB — POCT PREGNANCY, URINE: Preg Test, Ur: NEGATIVE

## 2016-11-09 MED ORDER — METRONIDAZOLE 0.75 % VA GEL: 1.0000 | VAGINAL | 0 refills | Status: AC

## 2016-11-09 MED ORDER — METRONIDAZOLE 500 MG PO TABS
500.0000 mg | ORAL_TABLET | Freq: Two times a day (BID) | ORAL | 0 refills | Status: DC
Start: 2016-11-09 — End: 2017-02-23

## 2016-11-09 NOTE — MAU Provider Note (Signed)
History     CSN: [id number]  Arrival date and time: 11/09/16 [id number]   First Provider Initiated Contact with Patient 11/09/16 1042      Chief Complaint  Patient presents with  . Vaginal Discharge   HPI Tanya Austin is a 45 y.o. [phone] female who presents for vaginal discharge. PMH significant for recurrent BV infections. Has taken metronidazole, metrogel, boric acid, & clindamycin in the past, but states the symptoms return. Was most recently treated with metronidazole 1 month ago. Current symptoms began last week. Reports thick yellow discharge with associated itching. No odor. Is in monogamous relationship & using condoms. Denies fever/chills, dyspareunia, postcoital bleeding, or dysuria.   Past Medical History:  Diagnosis Date  . Diabetes mellitus   . Fibroid   . Headache   . High cholesterol     Past Surgical History:  Procedure Laterality Date  . CERVICAL BIOPSY      Family History  Problem Relation Age of Onset  . Hypertension Other   . Hyperlipidemia Other   . Heart disease Other   . Hypertension Mother   . Diabetes Mother   . Hypertension Father     Social History  Substance Use Topics  . Smoking status: Former Smoker    Types: Cigarettes    Quit date: 06/09/2000  . Smokeless tobacco: Never Used  . Alcohol use Yes     Comment: Socially on weekends    Allergies: No Known Allergies  Prescriptions Prior to Admission  Medication Sig Dispense Refill Last Dose  . acetaminophen (TYLENOL) 500 MG tablet Take 1,000 mg by mouth 2 (two) times daily as needed for moderate pain.   Taking  . Cholecalciferol (VITAMIN D3) 3000 units TABS Take by mouth.   Taking  . fish oil-omega-3 fatty acids 1000 MG capsule Take 1 g by mouth daily.   Taking  . ibuprofen (ADVIL,MOTRIN) 600 MG tablet Take 1 tablet (600 mg total) by mouth every 6 (six) hours as needed. 30 tablet 0   . Insulin Glargine (TOUJEO SOLOSTAR) 300 UNIT/ML SOPN Inject 14 Units into the skin at bedtime.    Taking  . IRON PO Take 1 tablet by mouth daily.   Taking  . metroNIDAZOLE (FLAGYL) 500 MG tablet Take 1 tablet (500 mg total) by mouth 2 (two) times daily. 14 tablet 0   . Norgestimate-Ethinyl Estradiol Triphasic (TRINESSA, 28,) 0.18/0.215/0.25 MG-35 MCG tablet Take 1 tablet by mouth daily.   Taking  . pioglitazone (ACTOS) 30 MG tablet Take 30 mg by mouth daily.  3 Taking  . Probiotic Product (PROBIOTIC DAILY PO) Take 1 capsule by mouth daily.   Taking  . simvastatin (ZOCOR) 20 MG tablet Take 20 mg by mouth at bedtime.    Taking  . sitaGLIPtin-metformin (JANUMET) 50-1000 MG per tablet Take 1 tablet by mouth 2 (two) times daily with a meal.   Taking    Review of Systems  Constitutional: Negative.   Gastrointestinal: Negative.   Genitourinary: Negative for dysuria.       + vaginal discharge   Physical Exam   Blood pressure 133/78, pulse 69, temperature 97.8 F (36.6 C), temperature source Oral, resp. rate 18, weight 237 lb 12.8 oz (107.9 kg), last menstrual period 10/14/2016.  Physical Exam  Nursing note and vitals reviewed. Constitutional: She is oriented to person, place, and time. She appears well-developed and well-nourished. No distress.  HENT:  Head: Normocephalic and atraumatic.  Eyes: Conjunctivae are normal. Right eye exhibits no discharge. Left eye exhibits  no discharge. No scleral icterus.  Neck: Normal range of motion.  Respiratory: Effort normal. No respiratory distress.  GI: Soft. She exhibits no distension. There is no tenderness.  Genitourinary: Uterus normal. Cervix exhibits no motion tenderness and no friability. No erythema in the vagina. Vaginal discharge (small amount of thin yellow discharge) found.  Neurological: She is alert and oriented to person, place, and time.  Skin: Skin is warm and dry. She is not diaphoretic.  Psychiatric: She has a normal mood and affect. Her behavior is normal. Judgment and thought content normal.    MAU Course  Procedures Results  for orders placed or performed during the hospital encounter of 11/09/16 (from the past 24 hour(s))  Urinalysis, Routine w reflex microscopic     Status: Abnormal   Collection Time: 11/09/16 10:19 AM  Result Value Ref Range   Color, Urine YELLOW YELLOW   APPearance HAZY (A) CLEAR   Specific Gravity, Urine 1.024 1.005 - 1.030   pH 5.0 5.0 - 8.0   Glucose, UA NEGATIVE NEGATIVE mg/dL   Hgb urine dipstick NEGATIVE NEGATIVE   Bilirubin Urine NEGATIVE NEGATIVE   Ketones, ur NEGATIVE NEGATIVE mg/dL   Protein, ur NEGATIVE NEGATIVE mg/dL   Nitrite NEGATIVE NEGATIVE   Leukocytes, UA MODERATE (A) NEGATIVE   RBC / HPF 0-5 0 - 5 RBC/hpf   WBC, UA 6-30 0 - 5 WBC/hpf   Bacteria, UA RARE (A) NONE SEEN   Squamous Epithelial / LPF 6-30 (A) NONE SEEN   Mucous PRESENT   Pregnancy, urine POC     Status: None   Collection Time: 11/09/16 10:26 AM  Result Value Ref Range   Preg Test, Ur NEGATIVE NEGATIVE  Wet prep, genital     Status: Abnormal   Collection Time: 11/09/16 11:30 AM  Result Value Ref Range   Yeast Wet Prep HPF POC NONE SEEN NONE SEEN   Trich, Wet Prep NONE SEEN NONE SEEN   Clue Cells Wet Prep HPF POC PRESENT (A) NONE SEEN   WBC, Wet Prep HPF POC MODERATE (A) NONE SEEN   Sperm NONE SEEN     MDM UPT negative GC/CT & wet prep Discussed suppressive therapy using metrogel. Pt would like oral tx initially before starting suppressive therapy.   Assessment and Plan  A: 1. BV (bacterial vaginosis)    P: Discharge home Rx flagyl 500mg  BID x 7 days followed by metrogel 2x/week x 12 weeks GC/CT pending Discussed reasons to return to MAU F/u with [facility] as needed  Jorje Guild 11/09/2016, 10:43 AM

## 2016-11-09 NOTE — MAU Note (Addendum)
Has a discharge.  First noted last Thurs.  Some discomfort when she wipes.

## 2016-11-09 NOTE — Discharge Instructions (Signed)
Bacterial Vaginosis Bacterial vaginosis is a vaginal infection that occurs when the normal balance of bacteria in the vagina is disrupted. It results from an overgrowth of certain bacteria. This is the most common vaginal infection among women ages 15-44. Because bacterial vaginosis increases your risk for STIs (sexually transmitted infections), getting treated can help reduce your risk for chlamydia, gonorrhea, herpes, and HIV (human immunodeficiency virus). Treatment is also important for preventing complications in pregnant women, because this condition can cause an early (premature) delivery. What are the causes? This condition is caused by an increase in harmful bacteria that are normally present in small amounts in the vagina. However, the reason that the condition develops is not fully understood. What increases the risk? The following factors may make you more likely to develop this condition:  Having a new sexual partner or multiple sexual partners.  Having unprotected sex.  Douching.  Having an intrauterine device (IUD).  Smoking.  Drug and alcohol abuse.  Taking certain antibiotic medicines.  Being pregnant.  You cannot get bacterial vaginosis from toilet seats, bedding, swimming pools, or contact with objects around you. What are the signs or symptoms? Symptoms of this condition include:  Grey or white vaginal discharge. The discharge can also be watery or foamy.  A fish-like odor with discharge, especially after sexual intercourse or during menstruation.  Itching in and around the vagina.  Burning or pain with urination.  Some women with bacterial vaginosis have no signs or symptoms. How is this diagnosed? This condition is diagnosed based on:  Your medical history.  A physical exam of the vagina.  Testing a sample of vaginal fluid under a microscope to look for a large amount of bad bacteria or abnormal cells. Your health care provider may use a cotton swab  or a small wooden spatula to collect the sample.  How is this treated? This condition is treated with antibiotics. These may be given as a pill, a vaginal cream, or a medicine that is put into the vagina (suppository). If the condition comes back after treatment, a second round of antibiotics may be needed. Follow these instructions at home: Medicines  Take over-the-counter and prescription medicines only as told by your health care provider.  Take or use your antibiotic as told by your health care provider. Do not stop taking or using the antibiotic even if you start to feel better. General instructions  If you have a female sexual partner, tell her that you have a vaginal infection. She should see her health care provider and be treated if she has symptoms. If you have a female sexual partner, he does not need treatment.  During treatment: ? Avoid sexual activity until you finish treatment. ? Do not douche. ? Avoid alcohol as directed by your health care provider. ? Avoid breastfeeding as directed by your health care provider.  Drink enough water and fluids to keep your urine clear or pale yellow.  Keep the area around your vagina and rectum clean. ? Wash the area daily with warm water. ? Wipe yourself from front to back after using the toilet.  Keep all follow-up visits as told by your health care provider. This is important. How is this prevented?  Do not douche.  Wash the outside of your vagina with warm water only.  Use protection when having sex. This includes latex condoms and dental dams.  Limit how many sexual partners you have. To help prevent bacterial vaginosis, it is best to have sex with just   one partner (monogamous).  Make sure you and your sexual partner are tested for STIs.  Wear cotton or cotton-lined underwear.  Avoid wearing tight pants and pantyhose, especially during summer.  Limit the amount of alcohol that you drink.  Do not use any products that  contain nicotine or tobacco, such as cigarettes and e-cigarettes. If you need help quitting, ask your health care provider.  Do not use illegal drugs. Where to find more information:  Centers for Disease Control and Prevention: www.cdc.gov/std  American Sexual Health Association (ASHA): www.ashastd.org  U.S. Department of Health and Human Services, Office on Women's Health: www.womenshealth.gov/ or https://www.womenshealth.gov/a-z-topics/bacterial-vaginosis Contact a health care provider if:  Your symptoms do not improve, even after treatment.  You have more discharge or pain when urinating.  You have a fever.  You have pain in your abdomen.  You have pain during sex.  You have vaginal bleeding between periods. Summary  Bacterial vaginosis is a vaginal infection that occurs when the normal balance of bacteria in the vagina is disrupted.  Because bacterial vaginosis increases your risk for STIs (sexually transmitted infections), getting treated can help reduce your risk for chlamydia, gonorrhea, herpes, and HIV (human immunodeficiency virus). Treatment is also important for preventing complications in pregnant women, because the condition can cause an early (premature) delivery.  This condition is treated with antibiotic medicines. These may be given as a pill, a vaginal cream, or a medicine that is put into the vagina (suppository). This information is not intended to replace advice given to you by your health care provider. Make sure you discuss any questions you have with your health care provider. Document Released: 11/16/2005 Document Revised: 08/01/2016 Document Reviewed: 08/01/2016 Elsevier Interactive Patient Education  2017 [employer]  

## 2016-11-09 NOTE — MAU Provider Note (Signed)
MAU HISTORY AND PHYSICAL  Chief Complaint:  Vaginal Discharge   Tanya Austin is a 45 y.o.  [phone] with PMH significant for recurrent BV and post-coital bleeding presenting for vaginal discharge. Patient states she has been having white, thick vaginal discharge and itching for 4 days. Symptoms have progressively worsened, and she experiences mild vaginal pain, particularly after wiping. Claims symptoms are similar to other BV episodes in the past, however does not note fishy odor this time. Patient has had recurrent BV with last diagnosis 1 month ago when she was treated with metronidazole. She denies abdominal pain, urinary frequency, urgency, and pain. No recent intercourse, last time was a few weeks ago and condom broke. No fevers/chills. [facility]. Of note, patient had post-coital bleeding, early this year for which she obtained a cervical biopsy which was negative for cervical cancer. Most recent pap smear negative a few months ago.     Past Medical History:  Diagnosis Date  . Diabetes mellitus   . Fibroid   . Headache   . High cholesterol     Past Surgical History:  Procedure Laterality Date  . CERVICAL BIOPSY      Family History  Problem Relation Age of Onset  . Hypertension Other   . Hyperlipidemia Other   . Heart disease Other   . Hypertension Mother   . Diabetes Mother   . Hypertension Father     Social History  Substance Use Topics  . Smoking status: Former Smoker    Types: Cigarettes    Quit date: 06/09/2000  . Smokeless tobacco: Never Used  . Alcohol use Yes     Comment: Socially on weekends    No Known Allergies  Prescriptions Prior to Admission  Medication Sig Dispense Refill Last Dose  . acetaminophen (TYLENOL) 500 MG tablet Take 1,000 mg by mouth 2 (two) times daily as needed for moderate pain.   Taking  . Cholecalciferol (VITAMIN D3) 3000 units TABS Take by mouth.   Taking  . fish oil-omega-3 fatty acids 1000 MG capsule Take 1 g  by mouth daily.   Taking  . ibuprofen (ADVIL,MOTRIN) 600 MG tablet Take 1 tablet (600 mg total) by mouth every 6 (six) hours as needed. 30 tablet 0   . Insulin Glargine (TOUJEO SOLOSTAR) 300 UNIT/ML SOPN Inject 14 Units into the skin at bedtime.   Taking  . IRON PO Take 1 tablet by mouth daily.   Taking  . metroNIDAZOLE (FLAGYL) 500 MG tablet Take 1 tablet (500 mg total) by mouth 2 (two) times daily. 14 tablet 0   . Norgestimate-Ethinyl Estradiol Triphasic (TRINESSA, 28,) 0.18/0.215/0.25 MG-35 MCG tablet Take 1 tablet by mouth daily.   Taking  . pioglitazone (ACTOS) 30 MG tablet Take 30 mg by mouth daily.  3 Taking  . Probiotic Product (PROBIOTIC DAILY PO) Take 1 capsule by mouth daily.   Taking  . simvastatin (ZOCOR) 20 MG tablet Take 20 mg by mouth at bedtime.    Taking  . sitaGLIPtin-metformin (JANUMET) 50-1000 MG per tablet Take 1 tablet by mouth 2 (two) times daily with a meal.   Taking    Review of Systems - Negative except for what is mentioned in HPI.  Physical Exam  Blood pressure 133/78, pulse 69, temperature 97.8 F (36.6 C), temperature source Oral, resp. rate 18, weight 107.9 kg (237 lb 12.8 oz), last menstrual period 10/14/2016. GENERAL: Well-developed, well-nourished female in no acute distress.  LUNGS: Clear to auscultation bilaterally.  HEART:  Regular rate and rhythm. ABDOMEN: Soft, nontender, nondistended. No suprapubic tenderness.  PELVIC: Normal appearing external genitalia; normal appearing vaginal mucosa. Mild cervical bleeding. Thick white discharge. No other palpable masses, no uterine, cervical or adnexal tenderness.   Labs: Results for orders placed or performed during the hospital encounter of 11/09/16 (from the past 24 hour(s))  Pregnancy, urine POC   Collection Time: 11/09/16 10:26 AM  Result Value Ref Range   Preg Test, Ur NEGATIVE NEGATIVE    Imaging Studies:  Ct Head Wo Contrast  Result Date: 10/13/2016 CLINICAL DATA:  Occipital headaches for 1 day.   No injury or trauma. EXAM: CT HEAD WITHOUT CONTRAST TECHNIQUE: Contiguous axial images were obtained from the base of the skull through the vertex without intravenous contrast. COMPARISON:  09/25/2008. FINDINGS: Brain: No evidence of acute infarction, hemorrhage, hydrocephalus, extra-axial collection or mass lesion/mass effect. Normal cerebral volume. No white matter disease. Vascular: No hyperdense vessel or unexpected calcification. Skull: Normal. Negative for fracture or focal lesion. Sinuses/Orbits: No acute finding. Other: None. Compared with priors, similar appearance. IMPRESSION: Negative exam. Electronically Signed   By: Staci Righter M.D.   On: 10/13/2016 14:57    Assessment: Tanya Austin is  45 y.o. [id number] PMH significant for recurrent BV  presents with worsening thick, white, odorless vaginal discharge with associated pain and itching for 4 days who is found to have discharge and mild cervical bleeding on exam. Patient most likely experiencing BV or yeast infection at this time. Will test for GC and wet prep for BV/possible yeast. Given recurrent BV, will empirically treat for BV with metronidazole 500 mg PO BID for 7 days and Metrogel for 10-14 weeks.         Plan: Pregnancy test negative UA shows moderate leukocytes, rare bacteria, and squams   G/C Wet-prep Metronidazole 500 mg PO BID for 7 days and Metrogel for 10-14 weeks  Tanya Austin 12/11/201710:36 AM

## 2016-11-10 LAB — GC/CHLAMYDIA PROBE AMP (~~LOC~~) NOT AT ARMC
CHLAMYDIA, DNA PROBE: NEGATIVE
NEISSERIA GONORRHEA: NEGATIVE

## 2017-02-03 ENCOUNTER — Emergency Department (HOSPITAL_COMMUNITY)
Admission: EM | Admit: 2017-02-03 | Discharge: 2017-02-03 | Disposition: A | Discharge status: Home / Self Care | Payer: Medicaid Other | Attending: Emergency Medicine | Admitting: Emergency Medicine

## 2017-02-03 ENCOUNTER — Encounter (HOSPITAL_COMMUNITY): Payer: Self-pay | Admitting: *Deleted

## 2017-02-03 DIAGNOSIS — Y999 Unspecified external cause status: Secondary | ICD-10-CM | POA: Insufficient documentation

## 2017-02-03 DIAGNOSIS — S3992XA Unspecified injury of lower back, initial encounter: Secondary | ICD-10-CM | POA: Diagnosis present

## 2017-02-03 DIAGNOSIS — Z794 Long term (current) use of insulin: Secondary | ICD-10-CM | POA: Diagnosis not present

## 2017-02-03 DIAGNOSIS — S39012A Strain of muscle, fascia and tendon of lower back, initial encounter: Secondary | ICD-10-CM | POA: Insufficient documentation

## 2017-02-03 DIAGNOSIS — Y92410 Unspecified street and highway as the place of occurrence of the external cause: Secondary | ICD-10-CM | POA: Insufficient documentation

## 2017-02-03 DIAGNOSIS — Z87891 Personal history of nicotine dependence: Secondary | ICD-10-CM | POA: Diagnosis not present

## 2017-02-03 DIAGNOSIS — Y9389 Activity, other specified: Secondary | ICD-10-CM | POA: Insufficient documentation

## 2017-02-03 DIAGNOSIS — Z79899 Other long term (current) drug therapy: Secondary | ICD-10-CM | POA: Diagnosis not present

## 2017-02-03 DIAGNOSIS — E119 Type 2 diabetes mellitus without complications: Secondary | ICD-10-CM | POA: Diagnosis not present

## 2017-02-03 DIAGNOSIS — S40011A Contusion of right shoulder, initial encounter: Secondary | ICD-10-CM | POA: Insufficient documentation

## 2017-02-03 MED ORDER — IBUPROFEN 400 MG PO TABS
600.0000 mg | ORAL_TABLET | Freq: Once | ORAL | Status: AC
  Administered 2017-02-03: 600 mg via ORAL
  Filled 2017-02-03: qty 1

## 2017-02-03 MED ORDER — IBUPROFEN 800 MG PO TABS: 800.0000 mg | ORAL_TABLET | Freq: Four times a day (QID) | ORAL | 0 refills | Status: AC
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
## 2017-02-04 ENCOUNTER — Ambulatory Visit: Payer: Self-pay | Admitting: Medical

## 2017-02-08 ENCOUNTER — Other Ambulatory Visit (HOSPITAL_COMMUNITY)
Admission: RE | Admit: 2017-02-08 | Discharge: 2017-02-08 | Disposition: A | Discharge status: Home / Self Care | Payer: Medicaid Other | Source: Ambulatory Visit | Attending: Medical | Admitting: Medical

## 2017-02-08 ENCOUNTER — Encounter: Payer: Self-pay | Admitting: Medical

## 2017-02-08 ENCOUNTER — Ambulatory Visit (INDEPENDENT_AMBULATORY_CARE_PROVIDER_SITE_OTHER): Payer: Medicaid Other | Admitting: Medical

## 2017-02-08 VITALS — BP 131/75 | HR 75 | Wt 240.0 lb

## 2017-02-08 DIAGNOSIS — N760 Acute vaginitis: Secondary | ICD-10-CM | POA: Insufficient documentation

## 2017-02-08 DIAGNOSIS — Z113 Encounter for screening for infections with a predominantly sexual mode of transmission: Secondary | ICD-10-CM | POA: Diagnosis not present

## 2017-02-08 DIAGNOSIS — N898 Other specified noninflammatory disorders of vagina: Secondary | ICD-10-CM | POA: Diagnosis present

## 2017-02-08 DIAGNOSIS — B373 Candidiasis of vulva and vagina: Secondary | ICD-10-CM | POA: Diagnosis not present

## 2017-02-08 NOTE — Patient Instructions (Signed)

## 2017-02-08 NOTE — Progress Notes (Signed)
History:  [name] is a 46 y.o. [id number] who presents to clinic today for vaginal discharge and odor. The patient has had issues x years with recurrent BV. She has been using Metrogel for suppressive therapy until a few weeks ago and now.    The following portions of the patient's history were reviewed and updated as appropriate: allergies, current medications, family history, past medical history, social history, past surgical history and problem list.  Review of Systems:  Review of Systems  Constitutional: Negative for fever.  Gastrointestinal: Negative for abdominal pain.  Genitourinary: Negative for dysuria, frequency and urgency.       Neg - vaginal bleeding + vaginal discharge, odor      Objective:  Physical Exam BP 131/75   Pulse 75   Wt 240 lb (108.9 kg)   LMP 02/03/2017 (Exact Date)   BMI 37.59 kg/m  Physical Exam  Constitutional: She is oriented to person, place, and time. She appears well-developed and well-nourished. No distress.  HENT:  Head: Normocephalic.  Cardiovascular: Normal rate.   Pulmonary/Chest: Effort normal.  Abdominal: Soft.  Genitourinary: No erythema or bleeding in the vagina. Injury: small amount of thin, white discharge. Vaginal discharge found.  Neurological: She is alert and oriented to person, place, and time.  Skin: Skin is warm and dry. No erythema.  Psychiatric: She has a normal mood and affect.  Vitals reviewed.   Labs and Imaging - Wet prep and GC/Chlamydia today   Assessment & Plan:  Assessment: Vaginal discharge and odor with history of recurrent BV  Plans: Wait for results and treat with clindamycin if positive and suppressive therapy with Metrogel Patient to return to [facility] as needed if symptoms were to change or worsen  Luvenia Redden, PA-C 02/08/2017 11:04 AM

## 2017-02-09 LAB — CERVICOVAGINAL ANCILLARY ONLY
Bacterial vaginitis: POSITIVE — AB
Candida vaginitis: POSITIVE — AB
Chlamydia: NEGATIVE
NEISSERIA GONORRHEA: NEGATIVE
Trichomonas: NEGATIVE

## 2017-02-10 ENCOUNTER — Other Ambulatory Visit: Payer: Self-pay | Admitting: Medical

## 2017-02-10 DIAGNOSIS — B379 Candidiasis, unspecified: Secondary | ICD-10-CM

## 2017-02-10 DIAGNOSIS — N760 Acute vaginitis: Principal | ICD-10-CM

## 2017-02-10 DIAGNOSIS — B9689 Other specified bacterial agents as the cause of diseases classified elsewhere: Secondary | ICD-10-CM

## 2017-02-10 MED ORDER — FLUCONAZOLE 150 MG PO TABS: ORAL_TABLET | ORAL | 0 refills | Status: DC

## 2017-02-10 MED ORDER — CLINDAMYCIN HCL 300 MG PO CAPS: 300.0000 mg | ORAL_CAPSULE | Freq: Two times a day (BID) | ORAL | 0 refills | Status: DC

## 2017-02-11 ENCOUNTER — Encounter: Payer: Self-pay | Admitting: General Practice

## 2017-02-22 ENCOUNTER — Encounter: Payer: Self-pay | Admitting: *Deleted

## 2017-02-22 NOTE — Progress Notes (Unsigned)
Patient presented to clinic asking for Tanya Austin nurse. I spoke with her, she is concerned because she was treated for BV and yeast and was still having vaginal irritation. She took the diflucan and clindamycin as ordered. However after speaking with Almyra Free, she said she did not start 10 days of metrogel as ordered. Stated she did not have any at her pharmacy even though she had 2 full tubes at home. Stated she wanted to try "G[facility]ess Vaginal Detox Pearls" but did not know if it would be safe. Advised if she wished to start this therapy she should not use that and metrogel. Patient stated she will take metrogel for 10 days first, then try the vaginal detox pearls from online.

## 2017-02-23 ENCOUNTER — Encounter: Payer: Self-pay | Admitting: Advanced Practice Midwife

## 2017-02-23 ENCOUNTER — Ambulatory Visit: Payer: Medicaid Other | Admitting: Advanced Practice Midwife

## 2017-02-23 VITALS — BP 109/84 | HR 98 | Ht 67.0 in | Wt 237.6 lb

## 2017-02-23 DIAGNOSIS — N760 Acute vaginitis: Secondary | ICD-10-CM

## 2017-02-23 DIAGNOSIS — B9689 Other specified bacterial agents as the cause of diseases classified elsewhere: Secondary | ICD-10-CM

## 2017-02-23 DIAGNOSIS — B3731 Acute candidiasis of vulva and vagina: Secondary | ICD-10-CM | POA: Insufficient documentation

## 2017-02-23 DIAGNOSIS — B373 Candidiasis of vulva and vagina: Secondary | ICD-10-CM

## 2017-02-23 MED ORDER — NYSTATIN-TRIAMCINOLONE 100000-0.1 UNIT/GM-% EX OINT: 1.0000 "application " | TOPICAL_OINTMENT | Freq: Two times a day (BID) | CUTANEOUS | 0 refills | Status: DC

## 2017-02-23 MED ORDER — METRONIDAZOLE 500 MG PO TABS: 500.0000 mg | ORAL_TABLET | Freq: Two times a day (BID) | ORAL | 1 refills | Status: DC

## 2017-02-23 MED ORDER — FLUCONAZOLE 150 MG PO TABS: 150.0000 mg | ORAL_TABLET | Freq: Once | ORAL | 1 refills | Status: AC

## 2017-02-23 NOTE — Progress Notes (Signed)
Recurrent bv Visual inspection pos for  Yeast  Discuss natural remedies and treatments Rx for Diflucan   Subjective:     Patient ID: KALAN RINN, female   DOB: 1971-02-21, 46 y.o.   MRN: [id number]  HPI 46 y.o. [id number] presents to office for recurrent BV infection. She reports vaginal discharge with odor will improve with treatment then returns quickly afterwards. She started treatment with metrogel 2 days ago and now reports significant burning in vaginal and rectal areas that is new.  She reports twice daily showering and use of baby wipes every time she urinates.  She has tried natural remedies for BV like boric acid suppositories before but is not currently using these. She is taking a daily probiotic.  She denies vaginal bleeding, urinary symptoms, h/a, dizziness, n/v, or fever/chills.    Review of Systems  Constitutional: Negative for chills, fatigue and fever.  Respiratory: Negative for shortness of breath.   Cardiovascular: Negative for chest pain.  Genitourinary: Positive for vaginal discharge and vaginal pain. Negative for difficulty urinating, dysuria, flank pain, pelvic pain and vaginal bleeding.  Neurological: Negative for dizziness and headaches.  Psychiatric/Behavioral: Negative.        Objective:   Physical Exam VS reviewed, nursing note reviewed,  Constitutional: well developed, well nourished, no distress HEENT: normocephalic CV: normal rate Pulm/chest wall: normal effort Abdomen: soft Neuro: alert and oriented x 3 Skin: warm, dry Psych: affect normal  Pelvic exam: Cervix pink, visually closed, without lesion, moderate amount thick white curd-like discharge, vaginal walls and external genitalia normal     Assessment:     1. Vaginal candidiasis  - fluconazole (DIFLUCAN) 150 MG tablet; Take 1 tablet (150 mg total) by mouth once.  Dispense: 1 tablet; Refill: 1 - nystatin-triamcinolone ointment (MYCOLOG); Apply 1 application topically 2 (two) times daily.   Dispense: 30 g; Refill: 0  2. Bacterial vaginosis  - metroNIDAZOLE (FLAGYL) 500 MG tablet; Take 1 tablet (500 mg total) by mouth 2 (two) times daily.  Dispense: 14 tablet; Refill: 1    Plan:     Treat yeast and BV orally, and add topical Mycolog for comfort. Stop metrogel for now, resume when external irritation improves.  Continue probiotic.  Stop overwashing/use of baby wipes.  Keep area clean and dry without excessive use of soaps. Wear breathable cotton underwear and change often.  Pt to f/u in office PRN if symptoms persist.

## 2017-02-28 ENCOUNTER — Encounter (HOSPITAL_COMMUNITY): Payer: Self-pay

## 2017-02-28 ENCOUNTER — Inpatient Hospital Stay (HOSPITAL_COMMUNITY)
Admission: AD | Admit: 2017-02-28 | Discharge: 2017-02-28 | Disposition: A | Discharge status: Home / Self Care | Payer: Medicaid Other | Source: Ambulatory Visit | Attending: Obstetrics and Gynecology | Admitting: Obstetrics and Gynecology

## 2017-02-28 DIAGNOSIS — N898 Other specified noninflammatory disorders of vagina: Secondary | ICD-10-CM | POA: Diagnosis present

## 2017-02-28 DIAGNOSIS — E7800 Pure hypercholesterolemia, unspecified: Secondary | ICD-10-CM | POA: Insufficient documentation

## 2017-02-28 DIAGNOSIS — E119 Type 2 diabetes mellitus without complications: Secondary | ICD-10-CM | POA: Diagnosis not present

## 2017-02-28 DIAGNOSIS — B009 Herpesviral infection, unspecified: Secondary | ICD-10-CM | POA: Diagnosis not present

## 2017-02-28 DIAGNOSIS — R102 Pelvic and perineal pain: Secondary | ICD-10-CM | POA: Diagnosis present

## 2017-02-28 DIAGNOSIS — Z87891 Personal history of nicotine dependence: Secondary | ICD-10-CM | POA: Diagnosis not present

## 2017-02-28 HISTORY — DX: Herpesviral infection, unspecified: B00.9

## 2017-02-28 LAB — URINALYSIS, ROUTINE W REFLEX MICROSCOPIC
Bilirubin Urine: NEGATIVE
Glucose, UA: 50 mg/dL — AB
Hgb urine dipstick: NEGATIVE
Ketones, ur: NEGATIVE mg/dL
LEUKOCYTES UA: NEGATIVE
NITRITE: NEGATIVE
PH: 5 (ref 5.0–8.0)
Protein, ur: NEGATIVE mg/dL
Specific Gravity, Urine: 1.025 (ref 1.005–1.030)

## 2017-02-28 LAB — GLUCOSE, CAPILLARY: Glucose-Capillary: 109 mg/dL — ABNORMAL HIGH (ref 65–99)

## 2017-02-28 LAB — WET PREP, GENITAL
CLUE CELLS WET PREP: NONE SEEN
SPERM: NONE SEEN
TRICH WET PREP: NONE SEEN
YEAST WET PREP: NONE SEEN

## 2017-02-28 LAB — POCT PREGNANCY, URINE: Preg Test, Ur: NEGATIVE

## 2017-02-28 NOTE — MAU Note (Signed)
Burning in vagina and rectum, started last wk.  Was taking clindamycin for BV, diflucan for yeast, has taken 3 doses- total.  Still burning.

## 2017-02-28 NOTE — MAU Note (Addendum)
Patient does states she has herpes last outbreak 1 month ago. Unsure if this is what it could be.  She currently does not take any medications for this. Was diagnosed at age 46.

## 2017-02-28 NOTE — MAU Provider Note (Signed)
History   Pt is in with c/o vag discharge and vag burning for 1 1/2 wks. Was seen in office and treated with clinda but has not helped.  CSN: [id number]  Arrival date & time 02/28/17  1107   None     Chief Complaint  Patient presents with  . vag burning    HPI  Past Medical History:  Diagnosis Date  . Diabetes mellitus   . Fibroid   . Headache   . Herpes   . High cholesterol     Past Surgical History:  Procedure Laterality Date  . CERVICAL BIOPSY      Family History  Problem Relation Age of Onset  . Hypertension Other   . Hyperlipidemia Other   . Heart disease Other   . Hypertension Mother   . Diabetes Mother   . Hypertension Father     Social History  Substance Use Topics  . Smoking status: Former Smoker    Types: Cigarettes    Quit date: 06/09/2000  . Smokeless tobacco: Never Used  . Alcohol use Yes     Comment: Socially on weekends    OB History    Gravida Para Term Preterm AB Living   4 4 4  0 0 4   SAB TAB Ectopic Multiple Live Births   0 0 0 0 4      Review of Systems  Constitutional: Negative.   HENT: Negative.   Eyes: Negative.   Respiratory: Negative.   Cardiovascular: Negative.   Gastrointestinal: Negative.   Endocrine: Negative.   Genitourinary: Positive for vaginal discharge and vaginal pain.  Musculoskeletal: Negative.   Skin: Negative.     Allergies  Patient has no known allergies.  Home Medications    BP 112/63 (BP Location: Right Arm)   Pulse 71   Temp 98.4 F (36.9 C) (Oral)   Resp 18   Wt 237 lb (107.5 kg)   LMP 02/03/2017 (Exact Date)   BMI 37.12 kg/m   Physical Exam  Constitutional: She is oriented to person, place, and time. She appears well-developed and well-nourished.  HENT:  Head: Normocephalic.  Eyes: Pupils are equal, round, and reactive to light.  Neck: Normal range of motion.  Cardiovascular: Normal rate, regular rhythm, normal heart sounds and intact distal pulses.   Pulmonary/Chest: Effort normal  and breath sounds normal.  Abdominal: Soft. Bowel sounds are normal.  Genitourinary: Uterus normal. Vaginal discharge found.  Musculoskeletal: Normal range of motion.  Neurological: She is alert and oriented to person, place, and time. She has normal reflexes.  Skin: Skin is warm and dry.  Psychiatric: She has a normal mood and affect. Her behavior is normal. Judgment and thought content normal.    MAU Course  Procedures (including critical care time)  Labs Reviewed  URINALYSIS, ROUTINE W REFLEX MICROSCOPIC - Abnormal; Notable for the following:       Result Value   APPearance HAZY (*)    Glucose, UA 50 (*)    All other components within normal limits  WET PREP, GENITAL  POCT PREGNANCY, URINE  GC/CHLAMYDIA PROBE AMP ([place]) NOT AT [facility]   No results found.   No diagnosis found.    MDM  Wet prep neg . gc and chla done. White creamy discharge present. Pelvic exam WNL. VSS. Will d/c home

## 2017-03-01 ENCOUNTER — Ambulatory Visit (INDEPENDENT_AMBULATORY_CARE_PROVIDER_SITE_OTHER): Payer: Medicaid Other | Admitting: Obstetrics & Gynecology

## 2017-03-01 ENCOUNTER — Encounter: Payer: Self-pay | Admitting: Obstetrics & Gynecology

## 2017-03-01 VITALS — BP 127/82 | HR 86 | Wt 238.9 lb

## 2017-03-01 DIAGNOSIS — N9489 Other specified conditions associated with female genital organs and menstrual cycle: Secondary | ICD-10-CM | POA: Diagnosis not present

## 2017-03-01 LAB — GC/CHLAMYDIA PROBE AMP (~~LOC~~) NOT AT ARMC
Chlamydia: NEGATIVE
NEISSERIA GONORRHEA: NEGATIVE

## 2017-03-01 MED ORDER — LIDOCAINE HCL 2 % EX GEL: 1.0000 "application " | CUTANEOUS | 2 refills | Status: DC | PRN

## 2017-03-01 NOTE — Progress Notes (Signed)
   Subjective:    Patient ID: Tanya Austin, female    DOB: 09-21-1971, 46 y.o.   MRN: [id number]  HPI 46 yo  S AA P4 here today with the issue of vulvar/vaginal/perianal burning. She was seen at the MAU several days ago and they could not find the etiology.    Review of Systems Monogamous for about a year. No sex for about 3 weeks. Not sure if he is monogamous. Not working Last pap 2017 negative     Objective:   Physical Exam [id number] Breathing, conversing, and ambulating normally Normal vulva, vagina, and perianal area Normal appearing vaginal discharge       Assessment & Plan:  Preventative care- She declines a mammogram at this time Vaginal/vulvar/perianal burning- prescribe lidocaine jelly, check HSV 2 IgG

## 2017-03-03 ENCOUNTER — Telehealth: Payer: Self-pay | Admitting: *Deleted

## 2017-03-03 MED ORDER — VALACYCLOVIR HCL 500 MG PO TABS: 500.0000 mg | ORAL_TABLET | Freq: Every day | ORAL | 12 refills | Status: DC

## 2017-03-03 NOTE — Telephone Encounter (Signed)
Patient called and revealed that she already has a diagnosis of herpes, no medicines being taken. I have prescribed valtrex 500 mg daily.

## 2017-03-03 NOTE — Telephone Encounter (Addendum)
Pt left message requesting test results. I called pt back and informed her of test results from visit to MAU on 4/1.  Pt also thought that Dr. Hulan Fray had done some swabs for testing during her visit to our office on 4/2. I could not find record of this and informed pt. I advised pt that Dr. Hulan Fray ordered a blood test to check for Herpes.  Pt then stated that she already is known to have herpes. I advised that I will consult with Dr. Hulan Fray and then call her back.   [place] pt after consult with Dr. Hulan Fray - no answer. Pt needs to be advised that a prescription has been sent to her pharmacy for herpes outbreak prevention. Dr. Hulan Fray believes the burning sensation is from the herpes virus. She did not perform any swab testing at the time of visit because they had been done the [facility] prior @ [facility]U.

## 2017-03-04 NOTE — Telephone Encounter (Signed)
Pt stopped by office to see if she needed to have lab test done. Advised patient labs not necessary as she is already aware of her hsv status. Medication is at the pharmacy, pt states that she will pick it up. She had no further questions or concerns. Pt appreciative of assistance.

## 2017-03-20 ENCOUNTER — Telehealth: Payer: Self-pay | Admitting: Obstetrics and Gynecology

## 2017-03-23 ENCOUNTER — Telehealth: Payer: Self-pay | Admitting: *Deleted

## 2017-03-23 NOTE — Telephone Encounter (Signed)
Pt called and stated that she needs to be seen because she is still having problems with burning. She has used the lidocaine and valtrex. Neither are helping. Advised patient that we have an opening next Wednesday may 2 with Dr. Hulan Fray or she could go to an urgent care if her need was emergent. Patient agreed to OV with Dr. Hulan Fray on 5/2.

## 2017-03-31 ENCOUNTER — Ambulatory Visit: Payer: Self-pay | Admitting: Obstetrics & Gynecology

## 2017-04-09 ENCOUNTER — Emergency Department (HOSPITAL_COMMUNITY)
Admission: EM | Admit: 2017-04-09 | Discharge: 2017-04-09 | Disposition: A | Discharge status: Home / Self Care | Payer: Medicaid Other | Attending: Emergency Medicine | Admitting: Emergency Medicine

## 2017-04-09 ENCOUNTER — Encounter (HOSPITAL_COMMUNITY): Payer: Self-pay | Admitting: Emergency Medicine

## 2017-04-09 DIAGNOSIS — Z794 Long term (current) use of insulin: Secondary | ICD-10-CM | POA: Insufficient documentation

## 2017-04-09 DIAGNOSIS — E119 Type 2 diabetes mellitus without complications: Secondary | ICD-10-CM | POA: Insufficient documentation

## 2017-04-09 DIAGNOSIS — H5712 Ocular pain, left eye: Secondary | ICD-10-CM | POA: Diagnosis present

## 2017-04-09 DIAGNOSIS — H109 Unspecified conjunctivitis: Secondary | ICD-10-CM | POA: Diagnosis not present

## 2017-04-09 DIAGNOSIS — Z87891 Personal history of nicotine dependence: Secondary | ICD-10-CM | POA: Insufficient documentation

## 2017-04-09 DIAGNOSIS — H1032 Unspecified acute conjunctivitis, left eye: Secondary | ICD-10-CM

## 2017-04-09 MED ORDER — TETRACAINE HCL 0.5 % OP SOLN: 2.0000 [drp] | Freq: Once | OPHTHALMIC | Status: DC

## 2017-04-09 MED ORDER — ERYTHROMYCIN 5 MG/GM OP OINT: TOPICAL_OINTMENT | OPHTHALMIC | 0 refills | Status: DC

## 2017-04-09 MED ORDER — POLYVINYL ALCOHOL 1.4 % OP SOLN
1.0000 [drp] | OPHTHALMIC | Status: DC | PRN
  Administered 2017-04-09: 1 [drp] via OPHTHALMIC
  Filled 2017-04-09: qty 15

## 2017-04-09 MED ORDER — ARTIFICIAL TEARS OPHTHALMIC OINT: TOPICAL_OINTMENT | Freq: Three times a day (TID) | OPHTHALMIC | 0 refills | Status: DC

## 2017-04-09 MED ORDER — TETRACAINE HCL 0.5 % OP SOLN
1.0000 [drp] | Freq: Once | OPHTHALMIC | Status: AC
  Administered 2017-04-09: 2 [drp] via OPHTHALMIC
  Filled 2017-04-09: qty 2

## 2017-04-09 NOTE — Discharge Instructions (Signed)
Please call Dr Zenia Resides office for an appointment to follow up by tomorrow.  Before then, use the antibiotic ointment I have prescribed 3 times per day.

## 2017-04-09 NOTE — ED Triage Notes (Signed)
Pt reports left eye redness and pain since yesterday, denies any drainage.

## 2017-04-09 NOTE — ED Provider Notes (Signed)
[facility] DEPT Provider Note   CSN: [id number] Arrival date & time: 04/09/17  0810     History   Chief Complaint Chief Complaint  Patient presents with  . Eye Pain    HPI Tanya Austin is a 46 y.o. female with history keratoconus OS, DM, and seasonal allergies who presents with red eye OS since yesterday morning.  She was in her usual state of health until yesterday morning when she woke up with red, sore left eye with foreign body sensation.  Some tearing, no purulence. 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  Has been having worse sinus headaches, sneezing, for several weeks since the pollen has been high.  No sick contacts  Sees [facility] for eye care.  HPI  Past Medical History:  Diagnosis Date  . Diabetes mellitus   . Fibroid   . Headache   . Herpes   . High cholesterol     Patient Active Problem List   Diagnosis Date Noted  . Vaginal candidiasis 02/23/2017  . Bacterial vaginosis 08/04/2016    Past Surgical History:  Procedure Laterality Date  . CERVICAL BIOPSY      OB History    Gravida Para Term Preterm AB Living   4 4 4  0 0 4   SAB TAB Ectopic Multiple Live Births   0 0 0 0 4       Home Medications    Prior to Admission medications   Medication Sig Start Date End Date Taking? Authorizing Provider  Cholecalciferol (VITAMIN D3) 3000 units TABS Take 1 capsule by mouth daily.    Yes [provider]  fish oil-omega-3 fatty acids 1000 MG capsule Take 1 g by mouth daily.   Yes [provider]  Insulin Glargine (TOUJEO SOLOSTAR) 300 UNIT/ML SOPN Inject 14 Units into the skin at bedtime.   Yes [provider]  Norgestimate-Ethinyl Estradiol Triphasic (TRINESSA, 28,) 0.18/0.215/0.25 MG-35 MCG tablet Take 1 tablet by mouth daily.   Yes [provider]  pioglitazone (ACTOS) 30 MG tablet Take 30 mg by mouth daily. 02/21/16  Yes [provider]  Probiotic Product (PROBIOTIC DAILY PO) Take 1 capsule by mouth daily.   Yes [provider]  simvastatin (ZOCOR) 20 MG tablet Take 20 mg by mouth at bedtime.    Yes [provider]  sitaGLIPtin-metformin (JANUMET) 50-1000 MG per tablet Take 1 tablet by mouth 2 (two) times daily with a meal.   Yes [provider]  acetaminophen (TYLENOL) 500 MG tablet Take 1,000 mg by mouth 2 (two) times daily as needed for moderate pain.    [provider]  erythromycin ophthalmic ointment Place a 1/2 inch ribbon of ointment into the lower eyelid 3 times per day 04/09/17   Minus Liberty, MD  IRON PO Take 1 tablet by mouth daily. otc medication- not sure of dose    [provider]  lidocaine (XYLOCAINE) 2 % jelly Apply 1 application topically as needed. 03/01/17   Emily Filbert, MD  metroNIDAZOLE (FLAGYL) 500 MG tablet Take 1 tablet (500 mg total) by mouth 2 (two) times daily. Patient not taking: Reported on 02/28/2017 02/23/17   Elvera Maria, CNM  nystatin-triamcinolone ointment (MYCOLOG) Apply 1 application topically 2 (two) times daily. Patient not taking: Reported on 02/28/2017 02/23/17   Elvera Maria, CNM  valACYclovir (VALTREX) 500 MG tablet Take 1 tablet (500 mg total) by mouth daily.  Can increase to twice a day for 5 days in the event of a recurrence 03/03/17   Emily Filbert, MD    Family History Family History  Problem Relation Age of Onset  . Hypertension Other   . Hyperlipidemia Other   . Heart disease Other   . Hypertension Mother   . Diabetes Mother   . Hypertension Father     Social History Social History  Substance Use Topics  . Smoking status: Former Smoker    Types: Cigarettes    Quit date: 06/09/2000  . Smokeless tobacco: Never Used  . Alcohol use Yes     Comment: Socially on weekends     Allergies   Patient has no known allergies.   Review of Systems Review of Systems  Constitutional: Negative for  chills and fever.  HENT: Positive for sinus pain, sinus pressure and sneezing.   Eyes: Positive for pain, redness and itching. Negative for discharge and visual disturbance.  Neurological: Positive for headaches. Negative for tremors and numbness.     Physical Exam Updated Vital Signs BP 132/73 (BP Location: Right Arm)   Pulse 81 Comment: Simultaneous filing. User may not have seen previous data.  Temp 98.1 F (36.7 C) (Oral)   Resp 16   SpO2 96% Comment: Simultaneous filing. User may not have seen previous data.  Physical Exam  Constitutional: She appears well-developed and well-nourished. No distress.  Eyes: EOM are normal. Pupils are equal, round, and reactive to light. Right eye exhibits no chemosis, no discharge and no exudate. No foreign body present in the right eye. Left eye exhibits no chemosis, no discharge and no exudate. Right conjunctiva is not injected. Right conjunctiva has no hemorrhage. Left conjunctiva is injected. Left conjunctiva has no hemorrhage. No scleral icterus.  Slit lamp exam:      The right eye shows no corneal ulcer, no foreign body, no hyphema and no hypopyon.       The left eye shows foreign body (Lash over medial conjunctival medial OS). The left eye shows no corneal abrasion, no corneal ulcer, no hyphema and no hypopyon.  Acuity better than CF OD, CF OS (at baseline per pt) Pressure (tonopen) 11 OD, 14 OS  Cardiovascular: Normal rate and regular rhythm.   Pulmonary/Chest: Effort normal and breath sounds normal.     ED Treatments / Results  Labs (all labs ordered are listed, but only abnormal results are displayed) Labs Reviewed - No data to display  EKG  EKG Interpretation None       Radiology No results found.  Procedures Procedures (including critical care time)  Medications Ordered in ED Medications  tetracaine (PONTOCAINE) 0.5 % ophthalmic solution 1-2 drop (2 drops Both Eyes Given 04/09/17 0849)     Initial Impression /  Assessment and Plan / ED Course  I have reviewed the triage vital signs and the nursing notes.  Pertinent labs & imaging results that were available during my care of the patient were reviewed by me and considered in my medical decision making (see chart for details).  Eye with baseline CF VA and acute conjunctival injection, dull pain, and foreign body sensation.  Her acuity is at baseline and pressure normal, not angle closure.  No chemosis, EOM full, not orbital cellulitis.  Most likely viral, possibly bacterial conjunctivitis. - erythromycin -f/u with ophthalmologist  Clinical Course as of Apr 09 1518  Fri Apr 09, 2017  1026 Eye flushed with saline, lash removed.  [MO]    Clinical  Course User Index [MO] Minus Liberty, MD   Final Clinical Impressions(s) / ED Diagnoses   Final diagnoses:  Acute conjunctivitis of left eye, unspecified acute conjunctivitis type    New Prescriptions Discharge Medication List as of 04/09/2017 11:18 AM    START taking these medications   Details  erythromycin ophthalmic ointment Place a 1/2 inch ribbon of ointment into the lower eyelid 3 times per day, Print         Minus Liberty, MD 04/09/17 1519    Julianne Rice, MD 04/16/17 1510

## 2017-05-18 ENCOUNTER — Ambulatory Visit (INDEPENDENT_AMBULATORY_CARE_PROVIDER_SITE_OTHER): Payer: Medicaid Other | Admitting: Obstetrics & Gynecology

## 2017-05-18 ENCOUNTER — Other Ambulatory Visit (HOSPITAL_COMMUNITY)
Admission: RE | Admit: 2017-05-18 | Discharge: 2017-05-18 | Disposition: A | Discharge status: Home / Self Care | Payer: Medicaid Other | Source: Ambulatory Visit | Attending: Obstetrics & Gynecology | Admitting: Obstetrics & Gynecology

## 2017-05-18 VITALS — BP 114/62 | HR 82 | Ht 67.5 in | Wt 233.0 lb

## 2017-05-18 DIAGNOSIS — R102 Pelvic and perineal pain: Secondary | ICD-10-CM | POA: Insufficient documentation

## 2017-05-18 MED ORDER — LIDOCAINE HCL 2 % EX GEL: 1.0000 "application " | CUTANEOUS | 2 refills | Status: DC | PRN

## 2017-05-18 NOTE — Progress Notes (Signed)
Patient states that she has been having burning and itching in her vagina for two months.

## 2017-05-18 NOTE — Progress Notes (Signed)
   Subjective:    Patient ID: Tanya Austin, female    DOB: September 27, 1971, 46 y.o.   MRN: [id number]  HPI 46 yo S AA P4 here with recurrent vulvar burning. She has it sporadically, sometimes worse with sex, does not uses gels or creams with sex. She has DM, and thinks that she has HSV. She has been taking valtrex for 2 months on a daily basis with no improvement. Her wet prep 4/18 was negative; the wet prep 3/18 showed BV and yeast.   Review of Systems     Objective:   Physical Exam Obese BFNAD Breathing, conversing, and ambulating normally Vulva and vagina with no obvious lesions Her discharge is white and non specific, no odor noted       Assessment & Plan:  Vulvar burning- ?etiology- possible diabetic nerve pain, HSV, vaginitis I will send another wet prep today, check HSV2 IgG Trial of lidocaine jelly

## 2017-05-19 LAB — HSV 2 ANTIBODY, IGG: HSV 2 Glycoprotein G Ab, IgG: 10.3 index — ABNORMAL HIGH (ref 0.00–0.90)

## 2017-05-19 LAB — CERVICOVAGINAL ANCILLARY ONLY
Bacterial vaginitis: NEGATIVE
Candida vaginitis: NEGATIVE

## 2017-05-24 ENCOUNTER — Telehealth: Payer: Self-pay

## 2017-05-24 MED ORDER — VALACYCLOVIR HCL 1 G PO TABS: 1000.0000 mg | ORAL_TABLET | Freq: Two times a day (BID) | ORAL | 0 refills | Status: DC

## 2017-05-24 NOTE — Telephone Encounter (Signed)
Spoke with Dr. Hulan Fray and provider recommended that pt takes Valtrex 1 gram po bid for 7 days then Valtrex 1 gram po daily and to make sure that she is using Lidocaine jelly.  LM for pt to return call back.

## 2017-05-24 NOTE — Telephone Encounter (Signed)
Pt called requesting results from her visit on 05/18/17.  I informed her that her wet prep was negative but that her HSV showed positive.  Pt stated that she is currently taking Valtrex po once daily and is not effective and she continues to have the burning sensation.  I asked pt if she is using the Lidocaine that the provider prescribed.  Pt stated that she has picked it up from the pharmacy yet.  I advise pt to please go to her [facility] off [address] and [address] to pick Lidocaine Rx to see if that helps and I will contact Dr. Hulan Fray.  I informed pt that if the provider has any other recommendations that I would give her a call back in the next couple days due to Dr. Hulan Fray not being in the office. Pt stated understanding.

## 2017-05-25 ENCOUNTER — Other Ambulatory Visit: Payer: Self-pay

## 2017-05-25 MED ORDER — VALACYCLOVIR HCL 1 G PO TABS: 500.0000 mg | ORAL_TABLET | Freq: Two times a day (BID) | ORAL | 1 refills | Status: DC

## 2017-05-25 NOTE — Telephone Encounter (Signed)
Spoke with patient  concerning medications and how to take valtrex. She is requesting a refill so that she will not run out.

## 2017-05-25 NOTE — Telephone Encounter (Signed)
Valtrex has been refill for patient since her dose was increase

## 2017-06-13 ENCOUNTER — Encounter (HOSPITAL_COMMUNITY): Payer: Self-pay | Admitting: *Deleted

## 2017-06-13 ENCOUNTER — Emergency Department (HOSPITAL_COMMUNITY)
Admission: EM | Admit: 2017-06-13 | Discharge: 2017-06-13 | Disposition: A | Discharge status: Home / Self Care | Payer: Medicaid Other | Attending: Emergency Medicine | Admitting: Emergency Medicine

## 2017-06-13 ENCOUNTER — Emergency Department (HOSPITAL_COMMUNITY): Payer: Medicaid Other

## 2017-06-13 DIAGNOSIS — E119 Type 2 diabetes mellitus without complications: Secondary | ICD-10-CM | POA: Insufficient documentation

## 2017-06-13 DIAGNOSIS — Z87891 Personal history of nicotine dependence: Secondary | ICD-10-CM | POA: Insufficient documentation

## 2017-06-13 DIAGNOSIS — Z7984 Long term (current) use of oral hypoglycemic drugs: Secondary | ICD-10-CM | POA: Diagnosis not present

## 2017-06-13 DIAGNOSIS — Z79899 Other long term (current) drug therapy: Secondary | ICD-10-CM | POA: Diagnosis not present

## 2017-06-13 DIAGNOSIS — M25572 Pain in left ankle and joints of left foot: Secondary | ICD-10-CM | POA: Insufficient documentation

## 2017-06-13 DIAGNOSIS — M79672 Pain in left foot: Secondary | ICD-10-CM

## 2017-06-13 MED ORDER — IBUPROFEN 400 MG PO TABS
400.0000 mg | ORAL_TABLET | Freq: Once | ORAL | Status: AC
  Administered 2017-06-13: 400 mg via ORAL
  Filled 2017-06-13: qty 1

## 2017-06-13 MED ORDER — IBUPROFEN 400 MG PO TABS: 400.0000 mg | ORAL_TABLET | Freq: Four times a day (QID) | ORAL | 0 refills | Status: DC | PRN

## 2017-06-13 NOTE — ED Notes (Signed)
Pt states last Monday she stepped up on curb not realizing that part of it was missing and hurt her left ankle and foot, states thought it would get better but pain remains in pinky toe and 4 th toe hurts to walk but she refused a w/c, good pulse to foot

## 2017-06-13 NOTE — ED Triage Notes (Signed)
Pt twisted L ankle last week and states still feels pain when ambulating.

## 2017-06-13 NOTE — ED Provider Notes (Signed)
[facility] DEPT Provider Note   CSN: [id number] Arrival date & time: 06/13/17  9024   By signing my name below, I, Eunice Blase, attest that this documentation has been prepared under the direction and in the presence of Gaia Gullikson, PA-C. Electronically signed, Eunice Blase, ED Scribe. 06/13/17. 10:12 AM.  History   Chief Complaint Chief Complaint  Patient presents with  . Ankle Pain   The history is provided by the patient and medical records. No language interpreter was used.    Tanya Austin is a 46 y.o. female presenting to the Emergency Department concerning L foot pain x ~8 days. She states she twisted the ankle while walking on the initial date of injury. She is unsure which way her foot twisted. Pt notes 7/10, lateral, posterior foot pain worse with ambulation. She states she rested 1-2 days following the injury and when she attempted to place weight on the foot 6 days ago, she could not d/t pain. She states the pain has gradually eased up and now she is able to walk with a limp after taking 20 steps on the foot. Originally she was unable to wiggle her toes due to pain, but now she can without any pain. No medications taken at home; she states she only rested and elevated her foot. She did not apply ice as she saw no swelling. She denies numbness and tingling. No other complaints at this time.   Past Medical History:  Diagnosis Date  . Diabetes mellitus   . Fibroid   . Headache   . Herpes   . High cholesterol     Patient Active Problem List   Diagnosis Date Noted  . Vaginal candidiasis 02/23/2017  . Bacterial vaginosis 08/04/2016    Past Surgical History:  Procedure Laterality Date  . CERVICAL BIOPSY      OB History    Gravida Para Term Preterm AB Living   4 4 4  0 0 4   SAB TAB Ectopic Multiple Live Births   0 0 0 0 4       Home Medications    Prior to Admission medications   Medication Sig Start Date End Date Taking? Authorizing Provider    acetaminophen (TYLENOL) 500 MG tablet Take 1,000 mg by mouth 2 (two) times daily as needed for moderate pain.    [provider]  Cholecalciferol (VITAMIN D3) 3000 units TABS Take 1 capsule by mouth daily.     [provider]  fish oil-omega-3 fatty acids 1000 MG capsule Take 1 g by mouth daily.    [provider]  ibuprofen (ADVIL,MOTRIN) 400 MG tablet Take 1 tablet (400 mg total) by mouth every 6 (six) hours as needed. 06/13/17   Tashawnda Bleiler, PA-C  Insulin Glargine (TOUJEO SOLOSTAR) 300 UNIT/ML SOPN Inject 14 Units into the skin at bedtime.    [provider]  IRON PO Take 1 tablet by mouth daily. otc medication- not sure of dose    [provider]  lidocaine (XYLOCAINE) 2 % jelly Apply 1 application topically as needed. 03/01/17   Emily Filbert, MD  lidocaine (XYLOCAINE) 2 % jelly Apply 1 application topically as needed. 05/18/17   Emily Filbert, MD  Norgestimate-Ethinyl Estradiol Triphasic (TRINESSA, 28,) 0.18/0.215/0.25 MG-35 MCG tablet Take 1 tablet by mouth daily.    [provider]  pioglitazone (ACTOS) 30 MG tablet Take 30 mg by mouth daily. 02/21/16   [provider]  Probiotic Product (PROBIOTIC DAILY PO) Take 1 capsule  by mouth daily.    [provider]  simvastatin (ZOCOR) 20 MG tablet Take 20 mg by mouth at bedtime.     [provider]  sitaGLIPtin-metformin (JANUMET) 50-1000 MG per tablet Take 1 tablet by mouth 2 (two) times daily with a meal.    [provider]  valACYclovir (VALTREX) 1000 MG tablet Take 0.5 tablets (500 mg total) by mouth 2 (two) times daily. 05/25/17   Emily Filbert, MD  valACYclovir (VALTREX) 500 MG tablet Take 1 tablet (500 mg total) by mouth daily. Can increase to twice a day for 5 days in the event of a recurrence 03/03/17   Emily Filbert, MD    Family History Family History  Problem Relation Age of Onset  . Hypertension Other   . Hyperlipidemia Other   . Heart disease  Other   . Hypertension Mother   . Diabetes Mother   . Hypertension Father     Social History Social History  Substance Use Topics  . Smoking status: Former Smoker    Types: Cigarettes    Quit date: 06/09/2000  . Smokeless tobacco: Never Used  . Alcohol use No     Comment: Socially on weekends     Allergies   Patient has no known allergies.   Review of Systems Review of Systems  Musculoskeletal: Positive for arthralgias and gait problem. Negative for joint swelling.  Skin: Negative for color change and wound.  Neurological: Negative for numbness.  All other systems reviewed and are negative.    Physical Exam Updated Vital Signs BP 119/73 (BP Location: Right Arm)   Pulse 92   Temp 98.1 F (36.7 C) (Oral)   Resp 18   Ht 5' 8.5" (1.74 m)   Wt 230 lb (104.3 kg)   LMP 05/09/2017   SpO2 100%   BMI 34.46 kg/m   Physical Exam  Constitutional: She is oriented to person, place, and time. She appears well-developed and well-nourished.  HENT:  Head: Normocephalic.  Eyes: EOM are normal.  Neck: Normal range of motion.  Cardiovascular: Normal rate and regular rhythm.   Pulmonary/Chest: Effort normal and breath sounds normal.  Abdominal: She exhibits no distension.  Musculoskeletal: Normal range of motion. She exhibits tenderness.  No obvious swelling, contusion, laceration or obvious injury. FROM of ankle and toes without pain. Minimal TTP of the third MTP. No TTP of plantar aspect of the foot. No TTP of ankle or achilles tendon. Sensation intact bilaterally. Pulses intact bilaterally. Color and warmth equal bilaterally. Pt is ambulatory.  Neurological: She is alert and oriented to person, place, and time.  Skin: Skin is warm and dry.  Psychiatric: She has a normal mood and affect.  Nursing note and vitals reviewed.    ED Treatments / Results  DIAGNOSTIC STUDIES: Oxygen Saturation is 100% on RA, NL by my interpretation.    COORDINATION OF CARE: 10:03 AM-Discussed  next steps with pt. Pt verbalized understanding and is agreeable with the plan. Will order medication and Xr.   Labs (all labs ordered are listed, but only abnormal results are displayed) Labs Reviewed - No data to display  EKG  EKG Interpretation None       Radiology Dg Foot Complete Left  Result Date: 06/13/2017 CLINICAL DATA:  Left ankle and foot injury stepping on to recurrent 1 week ago. Pain. Initial encounter. EXAM: LEFT FOOT - COMPLETE 3+ VIEW COMPARISON:  None. FINDINGS: There is no evidence of fracture or dislocation. There is no evidence of  arthropathy or other focal bone abnormality. Soft tissues are unremarkable. IMPRESSION: Negative exam. Electronically Signed   By: Inge Rise M.D.   On: 06/13/2017 10:34    Procedures Procedures (including critical care time)  Medications Ordered in ED Medications  ibuprofen (ADVIL,MOTRIN) tablet 400 mg (400 mg Oral Given 06/13/17 1016)     Initial Impression / Assessment and Plan / ED Course  I have reviewed the triage vital signs and the nursing notes.  Pertinent labs & imaging results that were available during my care of the patient were reviewed by me and considered in my medical decision making (see chart for details).     Patient presenting with a week of left foot pain following misstep. Pain is on the lateral aspect of her foot. She denies pain in her ankle, and has full range of motion of her toes and ankles without pain. Patient neurovascularly intact with soft compartments. Will address pain with ibuprofen, and order x-ray to rule out fracture or dislocation.  X-rays showed no fracture or dislocation. Pain is likely muscular/ligamentous. Will apply Ace wrap for support and comfort. Discussed findings with patient. As patient has been walking and pain has been improving, patient appears safe for discharge. Will provide prescription for ibuprofen. Discussed use and importance of staying well hydrated while taking  ibuprofen. Patient states her last labs were several months ago and showed no evidence of kidney injury. Return precautions given. Patient states she understands and agrees to plan.   Final Clinical Impressions(s) / ED Diagnoses   Final diagnoses:  Left foot pain    New Prescriptions New Prescriptions   IBUPROFEN (ADVIL,MOTRIN) 400 MG TABLET    Take 1 tablet (400 mg total) by mouth every 6 (six) hours as needed.   I personally performed the services described in this documentation, which was scribed in my presence. The recorded information has been reviewed and is accurate.     Franchot Heidelberg, PA-C 06/13/17 1045    Quintella Reichert, MD 06/13/17 1540

## 2017-06-13 NOTE — Discharge Instructions (Signed)
You may use ibuprofen as needed for pain. Continue to rest and elevate your foot. You may use ice to help with her symptom control. Wear shoes with good support as this will help your pain. You may also continue to use the Ace wrap as needed for support. It is not required that you wear it, but it may help with your symptoms. Follow-up with your primary care provider in one week if symptoms are not improving. Return to the emergency department if you develop numbness, tingling, or any new or worsening symptoms.

## 2017-06-13 NOTE — ED Notes (Signed)
Ace wrap to left foot

## 2017-07-13 ENCOUNTER — Emergency Department (HOSPITAL_COMMUNITY)
Admission: EM | Admit: 2017-07-13 | Discharge: 2017-07-13 | Disposition: A | Discharge status: Home / Self Care | Payer: Medicaid Other | Attending: Physician Assistant | Admitting: Physician Assistant

## 2017-07-13 ENCOUNTER — Encounter (HOSPITAL_COMMUNITY): Payer: Self-pay

## 2017-07-13 DIAGNOSIS — Z87891 Personal history of nicotine dependence: Secondary | ICD-10-CM | POA: Diagnosis not present

## 2017-07-13 DIAGNOSIS — Z79899 Other long term (current) drug therapy: Secondary | ICD-10-CM | POA: Insufficient documentation

## 2017-07-13 DIAGNOSIS — Z794 Long term (current) use of insulin: Secondary | ICD-10-CM | POA: Insufficient documentation

## 2017-07-13 DIAGNOSIS — E119 Type 2 diabetes mellitus without complications: Secondary | ICD-10-CM | POA: Diagnosis not present

## 2017-07-13 DIAGNOSIS — N898 Other specified noninflammatory disorders of vagina: Secondary | ICD-10-CM

## 2017-07-13 LAB — WET PREP, GENITAL
Clue Cells Wet Prep HPF POC: NONE SEEN
Sperm: NONE SEEN
Trich, Wet Prep: NONE SEEN
Yeast Wet Prep HPF POC: NONE SEEN

## 2017-07-13 LAB — URINALYSIS, ROUTINE W REFLEX MICROSCOPIC
Bilirubin Urine: NEGATIVE
GLUCOSE, UA: NEGATIVE mg/dL
Hgb urine dipstick: NEGATIVE
KETONES UR: NEGATIVE mg/dL
LEUKOCYTES UA: NEGATIVE
NITRITE: NEGATIVE
PH: 5 (ref 5.0–8.0)
PROTEIN: NEGATIVE mg/dL
Specific Gravity, Urine: 1.019 (ref 1.005–1.030)

## 2017-07-13 LAB — POC URINE PREG, ED: Preg Test, Ur: NEGATIVE

## 2017-07-13 NOTE — ED Provider Notes (Signed)
[facility] DEPT Provider Note   CSN: [id number] Arrival date & time: 07/13/17  1001     History   Chief Complaint Chief Complaint  Patient presents with  . Vaginal Discharge    HPI Tanya Austin is a 46 y.o. female.  HPI   46 year old female presents today with complaints of vaginal discharge. Patient reports approximately a week and half ago she started developing white discharge. She notes this was similar to previous infections and bacterial vaginosis or yeast infections. She notes she gets frequent bacterial vaginosis infections, and has refills of metronidazole at home. She notes she took this which did not improve her symptoms, last dose was 3 days ago. She denies any abdominal pain, nausea, vomiting, vaginal bleeding. Patient reports she was sexually active shortly before symptoms began. She denies any other complaints today. No medications prior to arrival.  Past Medical History:  Diagnosis Date  . Diabetes mellitus   . Fibroid   . Headache   . Herpes   . High cholesterol     Patient Active Problem List   Diagnosis Date Noted  . Vaginal candidiasis 02/23/2017  . Bacterial vaginosis 08/04/2016    Past Surgical History:  Procedure Laterality Date  . CERVICAL BIOPSY      OB History    Gravida Para Term Preterm AB Living   4 4 4  0 0 4   SAB TAB Ectopic Multiple Live Births   0 0 0 0 4       Home Medications    Prior to Admission medications   Medication Sig Start Date End Date Taking? Authorizing Provider  acetaminophen (TYLENOL) 500 MG tablet Take 1,000 mg by mouth 2 (two) times daily as needed for moderate pain.    [provider]  Cholecalciferol (VITAMIN D3) 3000 units TABS Take 1 capsule by mouth daily.     [provider]  fish oil-omega-3 fatty acids 1000 MG capsule Take 1 g by mouth daily.    [provider]  ibuprofen (ADVIL,MOTRIN) 400 MG tablet Take 1 tablet (400 mg total) by mouth every 6 (six) hours as  needed. 06/13/17   Caccavale, Sophia, PA-C  Insulin Glargine (TOUJEO SOLOSTAR) 300 UNIT/ML SOPN Inject 14 Units into the skin at bedtime.    [provider]  IRON PO Take 1 tablet by mouth daily. otc medication- not sure of dose    [provider]  lidocaine (XYLOCAINE) 2 % jelly Apply 1 application topically as needed. 03/01/17   Emily Filbert, MD  lidocaine (XYLOCAINE) 2 % jelly Apply 1 application topically as needed. 05/18/17   Emily Filbert, MD  Norgestimate-Ethinyl Estradiol Triphasic (TRINESSA, 28,) 0.18/0.215/0.25 MG-35 MCG tablet Take 1 tablet by mouth daily.    [provider]  pioglitazone (ACTOS) 30 MG tablet Take 30 mg by mouth daily. 02/21/16   [provider]  Probiotic Product (PROBIOTIC DAILY PO) Take 1 capsule by mouth daily.    [provider]  simvastatin (ZOCOR) 20 MG tablet Take 20 mg by mouth at bedtime.     [provider]  sitaGLIPtin-metformin (JANUMET) 50-1000 MG per tablet Take 1 tablet by mouth 2 (two) times daily with a meal.    [provider]  valACYclovir (VALTREX) 1000 MG tablet Take 0.5 tablets (500 mg total) by mouth 2 (two) times daily. 05/25/17   Emily Filbert, MD  valACYclovir (VALTREX) 500 MG tablet Take 1 tablet (500 mg total) by mouth daily. Can increase to twice a  day for 5 days in the event of a recurrence 03/03/17   Emily Filbert, MD    Family History Family History  Problem Relation Age of Onset  . Hypertension Other   . Hyperlipidemia Other   . Heart disease Other   . Hypertension Mother   . Diabetes Mother   . Hypertension Father     Social History Social History  Substance Use Topics  . Smoking status: Former Smoker    Types: Cigarettes    Quit date: 06/09/2000  . Smokeless tobacco: Never Used  . Alcohol use No     Comment: Socially on weekends     Allergies   Patient has no known allergies.   Review of Systems Review of Systems  All other systems reviewed and are  negative.    Physical Exam Updated Vital Signs BP 132/73 (BP Location: Left Arm)   Pulse 73   Temp 98.7 F (37.1 C) (Oral)   Resp 18   SpO2 100%   Physical Exam  Constitutional: She is oriented to person, place, and time. She appears well-developed and well-nourished.  HENT:  Head: Normocephalic and atraumatic.  Eyes: Pupils are equal, round, and reactive to light. Conjunctivae are normal. Right eye exhibits no discharge. Left eye exhibits no discharge. No scleral icterus.  Neck: Normal range of motion. No JVD present. No tracheal deviation present.  Pulmonary/Chest: Effort normal. No stridor.  Abdominal: Soft. Bowel sounds are normal. She exhibits no distension and no mass. There is no tenderness. There is no rebound and no guarding. No hernia.  Genitourinary:  Genitourinary Comments: Genitalia normal, no rash- small amount of CT white discharge in the vaginal vault, nontender to exam  Neurological: She is alert and oriented to person, place, and time. Coordination normal.  Psychiatric: She has a normal mood and affect. Her behavior is normal. Judgment and thought content normal.  Nursing note and vitals reviewed.    ED Treatments / Results  Labs (all labs ordered are listed, but only abnormal results are displayed) Labs Reviewed  WET PREP, GENITAL - Abnormal; Notable for the following:       Result Value   WBC, Wet Prep HPF POC FEW (*)    All other components within normal limits  URINALYSIS, ROUTINE W REFLEX MICROSCOPIC  POC URINE PREG, ED  GC/CHLAMYDIA PROBE AMP ([place]) NOT AT [facility]    EKG  EKG Interpretation None       Radiology No results found.  Procedures Procedures (including critical care time)  Medications Ordered in ED Medications - No data to display   Initial Impression / Assessment and Plan / ED Course  I have reviewed the triage vital signs and the nursing notes.  Pertinent labs & imaging results that were available during my care of  the patient were reviewed by me and considered in my medical decision making (see chart for details).       Final Clinical Impressions(s) / ED Diagnoses   Final diagnoses:  Vaginal discharge    46 year old female presents today with vaginal discharge. She has very small amount of discharge. Wet prep shows no acute findings. Patient does note that she had more vaginal discharge prior to ED evaluation. I informed her that she was having vaginal discharge with no signs of acute infection here and the question of STD remains. Patient is adamant that she does not have an STD and does not want treatment at this time. Labs will be sent, follow-up information given, return  precautions given. She verbalized understanding and agreement to today's plan.  New Prescriptions New Prescriptions   No medications on file     Okey Regal, Hershal Coria 07/13/17 1408    Mackuen, Fredia Sorrow, MD 07/13/17 1644

## 2017-07-13 NOTE — Discharge Instructions (Signed)
Please read attached information. If you experience any new or worsening signs or symptoms please return to the emergency room for evaluation. Please follow-up with your primary care provider or specialist as discussed.  °

## 2017-07-13 NOTE — ED Notes (Signed)
Pt verbalized understanding discharge instructions and denies any further needs or questions at this time. VS stable, ambulatory and steady gait.   

## 2017-07-13 NOTE — ED Triage Notes (Signed)
Per Pt, Pt is coming from home with complaints of white vaginal discharge that started one week ago. Pt reports itching with no vaginal bleeding or abdominal pain. Pt has hx of BV and reports taking one week of Flagyl with no relief.

## 2017-07-14 LAB — GC/CHLAMYDIA PROBE AMP (~~LOC~~) NOT AT ARMC
Chlamydia: NEGATIVE
Neisseria Gonorrhea: NEGATIVE

## 2017-07-31 ENCOUNTER — Telehealth (HOSPITAL_COMMUNITY): Payer: Self-pay | Admitting: Obstetrics and Gynecology

## 2017-08-30 ENCOUNTER — Other Ambulatory Visit: Payer: Self-pay | Admitting: *Deleted

## 2017-08-30 MED ORDER — VALACYCLOVIR HCL 1 G PO TABS: 1000.0000 mg | ORAL_TABLET | Freq: Every day | ORAL | 12 refills | Status: DC

## 2017-08-30 NOTE — Progress Notes (Signed)
Called pt in response to fax received from her pharmacy for refill of Valtrex.  Pt confirmed refill desired and that she prefers to take 1000 mg once daily rather than 500 mg twice daily. She is taking this due to vulvar burning and it has been working well. Rx order received from Dr. Hulan Fray and e-prescribed to pharmacy.

## 2017-10-21 ENCOUNTER — Emergency Department (HOSPITAL_COMMUNITY): Payer: Medicaid Other

## 2017-10-21 ENCOUNTER — Emergency Department (HOSPITAL_COMMUNITY)
Admission: EM | Admit: 2017-10-21 | Discharge: 2017-10-21 | Disposition: A | Discharge status: Home / Self Care | Payer: Medicaid Other | Attending: Emergency Medicine | Admitting: Emergency Medicine

## 2017-10-21 ENCOUNTER — Other Ambulatory Visit: Payer: Self-pay

## 2017-10-21 ENCOUNTER — Encounter (HOSPITAL_COMMUNITY): Payer: Self-pay | Admitting: Emergency Medicine

## 2017-10-21 DIAGNOSIS — Z79899 Other long term (current) drug therapy: Secondary | ICD-10-CM | POA: Insufficient documentation

## 2017-10-21 DIAGNOSIS — Z87891 Personal history of nicotine dependence: Secondary | ICD-10-CM | POA: Diagnosis not present

## 2017-10-21 DIAGNOSIS — R197 Diarrhea, unspecified: Secondary | ICD-10-CM | POA: Insufficient documentation

## 2017-10-21 DIAGNOSIS — Z794 Long term (current) use of insulin: Secondary | ICD-10-CM | POA: Insufficient documentation

## 2017-10-21 DIAGNOSIS — R112 Nausea with vomiting, unspecified: Secondary | ICD-10-CM | POA: Diagnosis not present

## 2017-10-21 DIAGNOSIS — R519 Headache, unspecified: Secondary | ICD-10-CM

## 2017-10-21 DIAGNOSIS — R1030 Lower abdominal pain, unspecified: Secondary | ICD-10-CM | POA: Insufficient documentation

## 2017-10-21 DIAGNOSIS — R51 Headache: Secondary | ICD-10-CM | POA: Diagnosis present

## 2017-10-21 DIAGNOSIS — E119 Type 2 diabetes mellitus without complications: Secondary | ICD-10-CM | POA: Diagnosis not present

## 2017-10-21 LAB — CBC WITH DIFFERENTIAL/PLATELET
BASOS ABS: 0 10*3/uL (ref 0.0–0.1)
BASOS PCT: 0 %
Eosinophils Absolute: 0.1 10*3/uL (ref 0.0–0.7)
Eosinophils Relative: 1 %
HEMATOCRIT: 34.8 % — AB (ref 36.0–46.0)
Hemoglobin: 11.7 g/dL — ABNORMAL LOW (ref 12.0–15.0)
LYMPHS PCT: 29 %
Lymphs Abs: 2.6 10*3/uL (ref 0.7–4.0)
MCH: 30.2 pg (ref 26.0–34.0)
MCHC: 33.6 g/dL (ref 30.0–36.0)
MCV: 89.9 fL (ref 78.0–100.0)
MONO ABS: 0.7 10*3/uL (ref 0.1–1.0)
Monocytes Relative: 8 %
NEUTROS ABS: 5.4 10*3/uL (ref 1.7–7.7)
Neutrophils Relative %: 62 %
Platelets: 236 10*3/uL (ref 150–400)
RBC: 3.87 MIL/uL (ref 3.87–5.11)
RDW: 13.6 % (ref 11.5–15.5)
WBC: 8.8 10*3/uL (ref 4.0–10.5)

## 2017-10-21 LAB — COMPREHENSIVE METABOLIC PANEL
ALT: 13 U/L — AB (ref 14–54)
ANION GAP: 9 (ref 5–15)
AST: 15 U/L (ref 15–41)
Albumin: 3.2 g/dL — ABNORMAL LOW (ref 3.5–5.0)
Alkaline Phosphatase: 46 U/L (ref 38–126)
BUN: 20 mg/dL (ref 6–20)
CALCIUM: 9.1 mg/dL (ref 8.9–10.3)
CHLORIDE: 109 mmol/L (ref 101–111)
CO2: 19 mmol/L — ABNORMAL LOW (ref 22–32)
CREATININE: 1.86 mg/dL — AB (ref 0.44–1.00)
GFR, EST AFRICAN AMERICAN: 37 mL/min — AB (ref >60)
GFR, EST NON AFRICAN AMERICAN: 32 mL/min — AB (ref >60)
Glucose, Bld: 121 mg/dL — ABNORMAL HIGH (ref 65–99)
Potassium: 4.3 mmol/L (ref 3.5–5.1)
Sodium: 137 mmol/L (ref 135–145)
Total Bilirubin: 0.7 mg/dL (ref 0.3–1.2)
Total Protein: 6.5 g/dL (ref 6.5–8.1)

## 2017-10-21 LAB — URINALYSIS, ROUTINE W REFLEX MICROSCOPIC
BILIRUBIN URINE: NEGATIVE
Glucose, UA: NEGATIVE mg/dL
Hgb urine dipstick: NEGATIVE
Ketones, ur: NEGATIVE mg/dL
LEUKOCYTES UA: NEGATIVE
NITRITE: NEGATIVE
PH: 5 (ref 5.0–8.0)
Protein, ur: NEGATIVE mg/dL
SPECIFIC GRAVITY, URINE: 1.005 (ref 1.005–1.030)

## 2017-10-21 LAB — I-STAT BETA HCG BLOOD, ED (MC, WL, AP ONLY)

## 2017-10-21 MED ORDER — SODIUM CHLORIDE 0.9 % IV BOLUS (SEPSIS)
1000.0000 mL | Freq: Once | INTRAVENOUS | Status: AC
  Administered 2017-10-21: 1000 mL via INTRAVENOUS

## 2017-10-21 MED ORDER — METOCLOPRAMIDE HCL 5 MG/ML IJ SOLN
10.0000 mg | Freq: Once | INTRAMUSCULAR | Status: AC
  Administered 2017-10-21: 10 mg via INTRAVENOUS
  Filled 2017-10-21: qty 2

## 2017-10-21 MED ORDER — DIPHENHYDRAMINE HCL 50 MG/ML IJ SOLN
25.0000 mg | Freq: Once | INTRAMUSCULAR | Status: AC
  Administered 2017-10-21: 25 mg via INTRAVENOUS
  Filled 2017-10-21: qty 1

## 2017-10-21 MED ORDER — SODIUM CHLORIDE 0.9 % IV BOLUS (SEPSIS)
1000.0000 mL | Freq: Once | INTRAVENOUS | Status: AC
Start: 2017-10-21 — End: 2017-10-21
  Administered 2017-10-21: 1000 mL via INTRAVENOUS

## 2017-10-21 MED ORDER — ONDANSETRON HCL 4 MG PO TABS: 4.0000 mg | ORAL_TABLET | Freq: Three times a day (TID) | ORAL | 0 refills | Status: DC | PRN

## 2017-10-21 MED ORDER — LOPERAMIDE HCL 2 MG PO CAPS
4.0000 mg | ORAL_CAPSULE | Freq: Once | ORAL | Status: AC
  Administered 2017-10-21: 4 mg via ORAL
  Filled 2017-10-21: qty 2

## 2017-10-21 NOTE — ED Notes (Signed)
Pt reports a headache, diarrhea, and vomiting. Pt reports episodes of this in the past but never was evaluated. Pt reports this is worse then the last time.

## 2017-10-21 NOTE — ED Provider Notes (Signed)
[facility] EMERGENCY DEPARTMENT Provider Note   CSN: [id number] Arrival date & time: 10/21/17  0228  Time seem 03:50 AM   History   Chief Complaint Chief Complaint  Patient presents with  . Headache  . Diarrhea  . Emesis    HPI Tanya Austin is a 46 y.o. female.  HPI patient states November 18 she started having headache and indicates the back of her head in the bilateral sides of her head.  She describes it as tightness and pressure.  She denies any visual changes and at that time had no nausea or vomiting.  She denies any numbness or tingling of her extremities.  She states the following day November 19 she continued to have the headache and started having diarrhea a few times that she describes as watery.  Discontinued the following day.  Yesterday November 21 she had diarrhea 4-5 times and she had vomited once this morning about 2:30 AM.  She has some crampy lower abdominal pain with the diarrhea.  She denies any blood in her diarrhea.  She denies feeling lightheaded or dizzy.  She denies being under any extra stress.  She does not have gas heat she has electric heat.  She states she has a history of migraines but they were usually in the front and she has not had them for several years.  She denies any neck pain.  She denies fever.   PCP Dr French Ana  Past Medical History:  Diagnosis Date  . Diabetes mellitus   . Fibroid   . Headache   . Herpes   . High cholesterol     Patient Active Problem List   Diagnosis Date Noted  . Vaginal candidiasis 02/23/2017  . Bacterial vaginosis 08/04/2016    Past Surgical History:  Procedure Laterality Date  . CERVICAL BIOPSY      OB History    Gravida Para Term Preterm AB Living   4 4 4  0 0 4   SAB TAB Ectopic Multiple Live Births   0 0 0 0 4       Home Medications    Prior to Admission medications   Medication Sig Start Date End Date Taking? Authorizing Provider  acetaminophen (TYLENOL) 500 MG tablet  Take 1,000 mg by mouth 2 (two) times daily as needed for moderate pain.    [provider]  Cholecalciferol (VITAMIN D3) 3000 units TABS Take 1 capsule by mouth daily.     [provider]  fish oil-omega-3 fatty acids 1000 MG capsule Take 1 g by mouth daily.    [provider]  ibuprofen (ADVIL,MOTRIN) 400 MG tablet Take 1 tablet (400 mg total) by mouth every 6 (six) hours as needed. 06/13/17   Caccavale, Sophia, PA-C  Insulin Glargine (TOUJEO SOLOSTAR) 300 UNIT/ML SOPN Inject 14 Units into the skin at bedtime.    [provider]  IRON PO Take 1 tablet by mouth daily. otc medication- not sure of dose    [provider]  lidocaine (XYLOCAINE) 2 % jelly Apply 1 application topically as needed. 03/01/17   Emily Filbert, MD  lidocaine (XYLOCAINE) 2 % jelly Apply 1 application topically as needed. 05/18/17   Emily Filbert, MD  Norgestimate-Ethinyl Estradiol Triphasic (TRINESSA, 28,) 0.18/0.215/0.25 MG-35 MCG tablet Take 1 tablet by mouth daily.    [provider]  ondansetron (ZOFRAN) 4 MG tablet Take 1 tablet (4 mg total) by mouth every 8 (eight) hours as needed for nausea or  vomiting. 10/21/17   Rolland Porter, MD  pioglitazone (ACTOS) 30 MG tablet Take 30 mg by mouth daily. 02/21/16   [provider]  Probiotic Product (PROBIOTIC DAILY PO) Take 1 capsule by mouth daily.    [provider]  simvastatin (ZOCOR) 20 MG tablet Take 20 mg by mouth at bedtime.     [provider]  sitaGLIPtin-metformin (JANUMET) 50-1000 MG per tablet Take 1 tablet by mouth 2 (two) times daily with a meal.    [provider]  valACYclovir (VALTREX) 1000 MG tablet Take 1 tablet (1,000 mg total) by mouth daily. 08/30/17   Emily Filbert, MD    Family History Family History  Problem Relation Age of Onset  . Hypertension Other   . Hyperlipidemia Other   . Heart disease Other   . Hypertension Mother   . Diabetes Mother   . Hypertension Father      Social History Social History   Tobacco Use  . Smoking status: Former Smoker    Types: Cigarettes    Last attempt to quit: 06/09/2000    Years since quitting: 17.3  . Smokeless tobacco: Never Used  Substance Use Topics  . Alcohol use: No    Comment: Socially on weekends  . Drug use: No  unemployed   Allergies   Patient has no known allergies.   Review of Systems Review of Systems  All other systems reviewed and are negative.    Physical Exam Updated Vital Signs BP 130/69   Pulse 66   Temp 97.8 F (36.6 C) (Oral)   Resp 16   LMP 10/17/2017   SpO2 99%   Physical Exam  Constitutional: She is oriented to person, place, and time. She appears well-developed and well-nourished.  Non-toxic appearance. She does not appear ill. No distress.  HENT:  Head: Normocephalic and atraumatic.  Right Ear: External ear normal.  Left Ear: External ear normal.  Nose: Nose normal. No mucosal edema or rhinorrhea.  Mouth/Throat: Oropharynx is clear and moist and mucous membranes are normal. No dental abscesses or uvula swelling.  Eyes: Conjunctivae and EOM are normal. Pupils are equal, round, and reactive to light.  Neck: Normal range of motion and full passive range of motion without pain. Neck supple.  Cardiovascular: Normal rate, regular rhythm and normal heart sounds. Exam reveals no gallop and no friction rub.  No murmur heard. Pulmonary/Chest: Effort normal and breath sounds normal. No respiratory distress. She has no wheezes. She has no rhonchi. She has no rales. She exhibits no tenderness and no crepitus.  Abdominal: Soft. Normal appearance and bowel sounds are normal. She exhibits no distension. There is no tenderness. There is no rebound and no guarding.  Musculoskeletal: Normal range of motion. She exhibits no edema or tenderness.  Moves all extremities well.   Neurological: She is alert and oriented to person, place, and time. She has normal strength. No cranial nerve  deficit.  Skin: Skin is warm, dry and intact. No rash noted. No erythema. No pallor.  Psychiatric: She has a normal mood and affect. Her speech is normal and behavior is normal. Her mood appears not anxious.  Nursing note and vitals reviewed.    ED Treatments / Results  Labs (all labs ordered are listed, but only abnormal results are displayed) Results for orders placed or performed during the hospital encounter of 10/21/17  Comprehensive metabolic panel  Result Value Ref Range   Sodium 137 135 - 145 mmol/L   Potassium 4.3 3.5 -  5.1 mmol/L   Chloride 109 101 - 111 mmol/L   CO2 19 (L) 22 - 32 mmol/L   Glucose, Bld 121 (H) 65 - 99 mg/dL   BUN 20 6 - 20 mg/dL   Creatinine, Ser 1.86 (H) 0.44 - 1.00 mg/dL   Calcium 9.1 8.9 - 10.3 mg/dL   Total Protein 6.5 6.5 - 8.1 g/dL   Albumin 3.2 (L) 3.5 - 5.0 g/dL   AST 15 15 - 41 U/L   ALT 13 (L) 14 - 54 U/L   Alkaline Phosphatase 46 38 - 126 U/L   Total Bilirubin 0.7 0.3 - 1.2 mg/dL   GFR calc non Af Amer 32 (L) >60 mL/min   GFR calc Af Amer 37 (L) >60 mL/min   Anion gap 9 5 - 15  Urinalysis, Routine w reflex microscopic  Result Value Ref Range   Color, Urine STRAW (A) YELLOW   APPearance CLEAR CLEAR   Specific Gravity, Urine 1.005 1.005 - 1.030   pH 5.0 5.0 - 8.0   Glucose, UA NEGATIVE NEGATIVE mg/dL   Hgb urine dipstick NEGATIVE NEGATIVE   Bilirubin Urine NEGATIVE NEGATIVE   Ketones, ur NEGATIVE NEGATIVE mg/dL   Protein, ur NEGATIVE NEGATIVE mg/dL   Nitrite NEGATIVE NEGATIVE   Leukocytes, UA NEGATIVE NEGATIVE  CBC with Differential  Result Value Ref Range   WBC 8.8 4.0 - 10.5 K/uL   RBC 3.87 3.87 - 5.11 MIL/uL   Hemoglobin 11.7 (L) 12.0 - 15.0 g/dL   HCT 34.8 (L) 36.0 - 46.0 %   MCV 89.9 78.0 - 100.0 fL   MCH 30.2 26.0 - 34.0 pg   MCHC 33.6 30.0 - 36.0 g/dL   RDW 13.6 11.5 - 15.5 %   Platelets 236 150 - 400 K/uL   Neutrophils Relative % 62 %   Neutro Abs 5.4 1.7 - 7.7 K/uL   Lymphocytes Relative 29 %   Lymphs Abs 2.6 0.7 -  4.0 K/uL   Monocytes Relative 8 %   Monocytes Absolute 0.7 0.1 - 1.0 K/uL   Eosinophils Relative 1 %   Eosinophils Absolute 0.1 0.0 - 0.7 K/uL   Basophils Relative 0 %   Basophils Absolute 0.0 0.0 - 0.1 K/uL  I-Stat beta hCG blood, ED  Result Value Ref Range   I-stat hCG, quantitative <5.0 <5 mIU/mL   Comment 3           Laboratory interpretation all normal except for mild renal insufficiency    EKG  EKG Interpretation None       Radiology Ct Head Wo Contrast  Result Date: 10/21/2017 CLINICAL DATA:  Posterior headache, nausea/vomiting, and diarrhea since Sunday. This is the third time this year she has had this. EXAM: CT HEAD WITHOUT CONTRAST TECHNIQUE: Contiguous axial images were obtained from the base of the skull through the vertex without intravenous contrast. COMPARISON:  10/13/2016 FINDINGS: Brain: No evidence of acute infarction, hemorrhage, hydrocephalus, extra-axial collection or mass lesion/mass effect. Vascular: No hyperdense vessel or unexpected calcification. Skull: Normal. Negative for fracture or focal lesion. Sinuses/Orbits: No acute finding. Other: No significant changes since previous study. IMPRESSION: No acute intracranial abnormalities. Electronically Signed   By: Lucienne Capers M.D.   On: 10/21/2017 04:35    Procedures Procedures (including critical care time)  Medications Ordered in ED Medications  sodium chloride 0.9 % bolus 1,000 mL (0 mLs Intravenous Stopped 10/21/17 0559)  sodium chloride 0.9 % bolus 1,000 mL (0 mLs Intravenous Stopped 10/21/17 0559)  metoCLOPramide (REGLAN) injection  10 mg (10 mg Intravenous Given 10/21/17 0500)  diphenhydrAMINE (BENADRYL) injection 25 mg (25 mg Intravenous Given 10/21/17 0500)  loperamide (IMODIUM) capsule 4 mg (4 mg Oral Given 10/21/17 0620)  sodium chloride 0.9 % bolus 1,000 mL (1,000 mLs Intravenous New Bag/Given 10/21/17 0620)     Initial Impression / Assessment and Plan / ED Course  I have reviewed the  triage vital signs and the nursing notes.  Pertinent labs & imaging results that were available during my care of the patient were reviewed by me and considered in my medical decision making (see chart for details).     Patient is noted to be very argumentative, she argued with me that where she had pain was not a "headache".  I pointed out to her that it was all part of her head.  She also was arguing with me about her blood work and not comprehending what I was talking about with her renal insufficiency.  She states she just had blood work done recently at her PCP office and was told everything was "normal".  I advised her to discuss that with her primary care doctor, they can look at our test results at her visit.   Patient was given IV fluids and IV migraine cocktail for her headache.  Since his headache is different from what she has had in the past CT of her head was done.  Recheck at 5:35 AM patient states her headaches gone.  She still has some lower abdominal cramping like she needs to have diarrhea but she has it.  She was given Imodium.    Final Clinical Impressions(s) / ED Diagnoses   Final diagnoses:  Nonintractable headache, unspecified chronicity pattern, unspecified headache type  Nausea vomiting and diarrhea    ED Discharge Orders        Ordered    ondansetron (ZOFRAN) 4 MG tablet  Every 8 hours PRN     10/21/17 0725    OTC imodium  Plan discharge  Rolland Porter, MD, Barbette Or, MD 10/21/17 [phone]

## 2017-10-21 NOTE — ED Notes (Signed)
Pt is extremely rude and cussing this RN demanding something to drink and pain medicine.

## 2017-10-21 NOTE — ED Triage Notes (Signed)
Patient reports headache with diarrhea onset Monday and emesis this evening , denies fever or chills .

## 2017-10-21 NOTE — Discharge Instructions (Signed)
Drink plenty of fluids (clear liquids) then start a bland diet later this morning such as toast, crackers, jello, [employer] chicken noodle soup. Use the zofran for nausea or vomiting. Take imodium OTC for diarrhea. Avoid milk products until the diarrhea is gone. ° °

## 2017-10-24 ENCOUNTER — Emergency Department (HOSPITAL_COMMUNITY)
Admission: EM | Admit: 2017-10-24 | Discharge: 2017-10-24 | Disposition: A | Discharge status: Home / Self Care | Payer: Medicaid Other | Attending: Emergency Medicine | Admitting: Emergency Medicine

## 2017-10-24 ENCOUNTER — Encounter (HOSPITAL_COMMUNITY): Payer: Self-pay | Admitting: *Deleted

## 2017-10-24 DIAGNOSIS — Z79899 Other long term (current) drug therapy: Secondary | ICD-10-CM | POA: Diagnosis not present

## 2017-10-24 DIAGNOSIS — R112 Nausea with vomiting, unspecified: Secondary | ICD-10-CM | POA: Diagnosis present

## 2017-10-24 DIAGNOSIS — R51 Headache: Secondary | ICD-10-CM | POA: Diagnosis not present

## 2017-10-24 DIAGNOSIS — M545 Low back pain, unspecified: Secondary | ICD-10-CM

## 2017-10-24 DIAGNOSIS — R519 Headache, unspecified: Secondary | ICD-10-CM

## 2017-10-24 DIAGNOSIS — R110 Nausea: Secondary | ICD-10-CM | POA: Insufficient documentation

## 2017-10-24 DIAGNOSIS — Z87891 Personal history of nicotine dependence: Secondary | ICD-10-CM | POA: Diagnosis not present

## 2017-10-24 DIAGNOSIS — E119 Type 2 diabetes mellitus without complications: Secondary | ICD-10-CM | POA: Diagnosis not present

## 2017-10-24 DIAGNOSIS — Z7984 Long term (current) use of oral hypoglycemic drugs: Secondary | ICD-10-CM | POA: Diagnosis not present

## 2017-10-24 LAB — CBC WITH DIFFERENTIAL/PLATELET
BASOS PCT: 0 %
Basophils Absolute: 0 10*3/uL (ref 0.0–0.1)
EOS ABS: 0.1 10*3/uL (ref 0.0–0.7)
Eosinophils Relative: 1 %
HEMATOCRIT: 31.7 % — AB (ref 36.0–46.0)
HEMOGLOBIN: 10.7 g/dL — AB (ref 12.0–15.0)
LYMPHS ABS: 2.6 10*3/uL (ref 0.7–4.0)
Lymphocytes Relative: 40 %
MCH: 30.1 pg (ref 26.0–34.0)
MCHC: 33.8 g/dL (ref 30.0–36.0)
MCV: 89 fL (ref 78.0–100.0)
MONOS PCT: 5 %
Monocytes Absolute: 0.3 10*3/uL (ref 0.1–1.0)
NEUTROS ABS: 3.5 10*3/uL (ref 1.7–7.7)
NEUTROS PCT: 54 %
Platelets: 215 10*3/uL (ref 150–400)
RBC: 3.56 MIL/uL — AB (ref 3.87–5.11)
RDW: 12.9 % (ref 11.5–15.5)
WBC: 6.5 10*3/uL (ref 4.0–10.5)

## 2017-10-24 LAB — COMPREHENSIVE METABOLIC PANEL
ALBUMIN: 2.9 g/dL — AB (ref 3.5–5.0)
ALK PHOS: 66 U/L (ref 38–126)
ALT: 15 U/L (ref 14–54)
ANION GAP: 6 (ref 5–15)
AST: 16 U/L (ref 15–41)
BILIRUBIN TOTAL: 0.5 mg/dL (ref 0.3–1.2)
BUN: 10 mg/dL (ref 6–20)
CALCIUM: 8 mg/dL — AB (ref 8.9–10.3)
CO2: 22 mmol/L (ref 22–32)
CREATININE: 0.85 mg/dL (ref 0.44–1.00)
Chloride: 105 mmol/L (ref 101–111)
GFR calc Af Amer: >60 mL/min (ref >60)
GFR calc non Af Amer: >60 mL/min (ref >60)
GLUCOSE: 90 mg/dL (ref 65–99)
Potassium: 3.7 mmol/L (ref 3.5–5.1)
SODIUM: 133 mmol/L — AB (ref 135–145)
TOTAL PROTEIN: 5.9 g/dL — AB (ref 6.5–8.1)

## 2017-10-24 LAB — CBG MONITORING, ED: Glucose-Capillary: 85 mg/dL (ref 65–99)

## 2017-10-24 LAB — LIPASE, BLOOD: Lipase: 20 U/L (ref 11–51)

## 2017-10-24 MED ORDER — SODIUM CHLORIDE 0.9 % IV BOLUS (SEPSIS)
1000.0000 mL | Freq: Once | INTRAVENOUS | Status: AC
  Administered 2017-10-24: 1000 mL via INTRAVENOUS

## 2017-10-24 MED ORDER — METHOCARBAMOL 500 MG PO TABS: 500.0000 mg | ORAL_TABLET | Freq: Four times a day (QID) | ORAL | 0 refills | Status: DC | PRN

## 2017-10-24 MED ORDER — PROCHLORPERAZINE EDISYLATE 5 MG/ML IJ SOLN
10.0000 mg | Freq: Once | INTRAMUSCULAR | Status: AC
  Administered 2017-10-24: 10 mg via INTRAVENOUS
  Filled 2017-10-24: qty 2

## 2017-10-24 MED ORDER — DEXAMETHASONE SODIUM PHOSPHATE 10 MG/ML IJ SOLN
10.0000 mg | Freq: Once | INTRAMUSCULAR | Status: AC
  Administered 2017-10-24: 10 mg via INTRAVENOUS
  Filled 2017-10-24: qty 1

## 2017-10-24 MED ORDER — DIPHENHYDRAMINE HCL 25 MG PO CAPS
25.0000 mg | ORAL_CAPSULE | Freq: Once | ORAL | Status: AC
  Administered 2017-10-24: 25 mg via ORAL
  Filled 2017-10-24: qty 1

## 2017-10-24 MED ORDER — DIPHENHYDRAMINE HCL 50 MG/ML IJ SOLN
25.0000 mg | Freq: Once | INTRAMUSCULAR | Status: AC
  Administered 2017-10-24: 25 mg via INTRAVENOUS
  Filled 2017-10-24: qty 1

## 2017-10-24 MED ORDER — PROMETHAZINE HCL 25 MG PO TABS: 25.0000 mg | ORAL_TABLET | Freq: Four times a day (QID) | ORAL | 0 refills | Status: DC | PRN

## 2017-10-24 NOTE — ED Notes (Addendum)
Pt repeatedly calling out with callbell; RN went to see patient and she stated "I wish that doctor would come on! Tanya Austin been feeling bad since thanksgiving and yall aint done nothing yet!" pt requesting food and pain medicine

## 2017-10-24 NOTE — ED Triage Notes (Signed)
Pt was here on Thursday for severe posterior headache with n/v/d. Pt was given meds but reports still having severe pain with nausea. Got out of bed last night and started having pain to her entire back. Has fatigue, denies fever.

## 2017-10-24 NOTE — Discharge Instructions (Signed)
You may take over the counter miralax for your constipation.

## 2017-10-24 NOTE — ED Notes (Signed)
Pt understood dc material. NAD NOted

## 2017-10-24 NOTE — ED Notes (Signed)
Pt getting dressed.
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
## 2017-11-05 ENCOUNTER — Encounter: Payer: Self-pay | Admitting: Advanced Practice Midwife

## 2017-11-05 ENCOUNTER — Other Ambulatory Visit: Payer: Self-pay | Admitting: Advanced Practice Midwife

## 2017-11-05 NOTE — Progress Notes (Signed)
MyChart message sent, pt requests Rx refill by telephone but is not specific about which medication.

## 2017-12-02 ENCOUNTER — Encounter (HOSPITAL_COMMUNITY): Payer: Self-pay | Admitting: *Deleted

## 2017-12-02 ENCOUNTER — Other Ambulatory Visit: Payer: Self-pay

## 2017-12-02 ENCOUNTER — Inpatient Hospital Stay (HOSPITAL_COMMUNITY)
Admission: AD | Admit: 2017-12-02 | Discharge: 2017-12-02 | Disposition: A | Discharge status: Home / Self Care | Payer: Medicaid Other | Source: Ambulatory Visit | Attending: Obstetrics & Gynecology | Admitting: Obstetrics & Gynecology

## 2017-12-02 DIAGNOSIS — Z87891 Personal history of nicotine dependence: Secondary | ICD-10-CM | POA: Insufficient documentation

## 2017-12-02 DIAGNOSIS — E119 Type 2 diabetes mellitus without complications: Secondary | ICD-10-CM | POA: Diagnosis not present

## 2017-12-02 DIAGNOSIS — Z794 Long term (current) use of insulin: Secondary | ICD-10-CM | POA: Diagnosis not present

## 2017-12-02 DIAGNOSIS — R102 Pelvic and perineal pain: Secondary | ICD-10-CM | POA: Insufficient documentation

## 2017-12-02 DIAGNOSIS — N9489 Other specified conditions associated with female genital organs and menstrual cycle: Secondary | ICD-10-CM | POA: Diagnosis not present

## 2017-12-02 DIAGNOSIS — R112 Nausea with vomiting, unspecified: Secondary | ICD-10-CM | POA: Diagnosis not present

## 2017-12-02 DIAGNOSIS — N949 Unspecified condition associated with female genital organs and menstrual cycle: Secondary | ICD-10-CM | POA: Diagnosis not present

## 2017-12-02 LAB — URINALYSIS, ROUTINE W REFLEX MICROSCOPIC
BILIRUBIN URINE: NEGATIVE
Glucose, UA: NEGATIVE mg/dL
HGB URINE DIPSTICK: NEGATIVE
Ketones, ur: NEGATIVE mg/dL
NITRITE: NEGATIVE
PH: 5 (ref 5.0–8.0)
Protein, ur: NEGATIVE mg/dL
SPECIFIC GRAVITY, URINE: 1.024 (ref 1.005–1.030)

## 2017-12-02 LAB — WET PREP, GENITAL
Clue Cells Wet Prep HPF POC: NONE SEEN
SPERM: NONE SEEN
Trich, Wet Prep: NONE SEEN
YEAST WET PREP: NONE SEEN

## 2017-12-02 LAB — POCT PREGNANCY, URINE: Preg Test, Ur: NEGATIVE

## 2017-12-02 NOTE — MAU Note (Signed)
Pt. Currently waiting in waiting room for lab results.
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
## 2017-12-02 NOTE — Discharge Instructions (Signed)
In late 2019, the [facility] will be moving to the [place] campus. At that time, the MAU (Maternity Admissions Unit), where you are being seen today, will no longer take care of non-pregnant patients. We strongly encourage you to find a doctor's office before that time, so that you can be seen with any GYN concerns, like vaginal discharge, urinary tract infection, etc.. in a timely manner. ° °In order to make an office visit more convenient, the [facility] will be offering evening hours with same-day appointments, walk-in appointments and scheduled appointments available during this time. ° °[facility] Hours: °Monday - 8am - 7:30 pm with walk-in between 4pm- 7:30 pm °Tuesday - 8 am - 5 pm (starting 03/01/18 we will be open late and accepting walk-ins from 4pm - 7:30pm) °Wednesday - 8 am - 5 pm (starting 06/01/18 we will be open late and accepting walk-ins from 4pm - 7:30pm) °Thursday 8 am - 5 pm (starting 09/01/18 we will be open late and accepting walk-ins from 4pm - 7:30pm) °Friday 8 am - 5 pm ° °For an appointment please call the [facility] at [phone] ° °For urgent needs, [facility] is also available for management of urgent GYN complaints such as vaginal discharge or urinary tract infections. ° ° ° ° ° °

## 2017-12-02 NOTE — MAU Note (Signed)
Pt presents with c/o clitoral pain and burning for a few days.  Denies vaginal itching or discharge.  Denies VB.

## 2017-12-03 LAB — GC/CHLAMYDIA PROBE AMP (~~LOC~~) NOT AT ARMC
Chlamydia: NEGATIVE
NEISSERIA GONORRHEA: NEGATIVE

## 2018-01-15 NOTE — Telephone Encounter (Signed)
Lease see note

## 2018-01-15 NOTE — Telephone Encounter (Signed)
Please see note.

## 2018-01-29 ENCOUNTER — Inpatient Hospital Stay (HOSPITAL_COMMUNITY)
Admission: AD | Admit: 2018-01-29 | Discharge: 2018-01-29 | Disposition: A | Discharge status: Home / Self Care | Payer: Medicaid Other | Source: Ambulatory Visit | Attending: Obstetrics and Gynecology | Admitting: Obstetrics and Gynecology

## 2018-01-29 ENCOUNTER — Encounter (HOSPITAL_COMMUNITY): Payer: Self-pay

## 2018-01-29 DIAGNOSIS — N898 Other specified noninflammatory disorders of vagina: Secondary | ICD-10-CM | POA: Diagnosis present

## 2018-01-29 DIAGNOSIS — B9689 Other specified bacterial agents as the cause of diseases classified elsewhere: Secondary | ICD-10-CM | POA: Diagnosis not present

## 2018-01-29 DIAGNOSIS — Z3202 Encounter for pregnancy test, result negative: Secondary | ICD-10-CM | POA: Diagnosis not present

## 2018-01-29 DIAGNOSIS — Z87891 Personal history of nicotine dependence: Secondary | ICD-10-CM | POA: Insufficient documentation

## 2018-01-29 DIAGNOSIS — N760 Acute vaginitis: Secondary | ICD-10-CM | POA: Insufficient documentation

## 2018-01-29 HISTORY — DX: Personal history of other infectious and parasitic diseases: Z86.19

## 2018-01-29 LAB — WET PREP, GENITAL
Sperm: NONE SEEN
Trich, Wet Prep: NONE SEEN
YEAST WET PREP: NONE SEEN

## 2018-01-29 LAB — URINALYSIS, ROUTINE W REFLEX MICROSCOPIC
BILIRUBIN URINE: NEGATIVE
Glucose, UA: NEGATIVE mg/dL
Hgb urine dipstick: NEGATIVE
KETONES UR: NEGATIVE mg/dL
Nitrite: NEGATIVE
Protein, ur: NEGATIVE mg/dL
Specific Gravity, Urine: 1.019 (ref 1.005–1.030)
pH: 5 (ref 5.0–8.0)

## 2018-01-29 LAB — POCT PREGNANCY, URINE: PREG TEST UR: NEGATIVE

## 2018-01-29 MED ORDER — METRONIDAZOLE 500 MG PO TABS: 500.0000 mg | ORAL_TABLET | Freq: Two times a day (BID) | ORAL | 0 refills | Status: DC

## 2018-01-29 NOTE — MAU Provider Note (Signed)
History     CSN: [id number]  Arrival date and time: 01/29/18 3474   First Provider Initiated Contact with Patient 01/29/18 0831      Chief Complaint  Patient presents with  . Vaginal Itching  . Vaginal Discharge   HPI Tanya Austin is a 47 y.o. non pregnant female who presents with vaginal discharge. Symptoms began 3 days ago. She does have a PCP but did not contact them.  Reports watery yellow/brown discharge. Endorses vaginal itching and irritation. Has not used OTC medications. Has HSV, but states doesn't feel like she has any lesions. She is sexually active with 1 partner x 3 months and doesn't always use condoms. Denies dyspareunia or post coital bleeding. Denies dysuria, vaginal bleeding, or abdominal pain.   Past Medical History:  Diagnosis Date  . Diabetes mellitus   . Fibroid   . Headache   . Herpes   . High cholesterol   . Hx of gonorrhea 2017    Past Surgical History:  Procedure Laterality Date  . CERVICAL BIOPSY      Family History  Problem Relation Age of Onset  . Hypertension Other   . Hyperlipidemia Other   . Heart disease Other   . Hypertension Mother   . Diabetes Mother   . Hypertension Father     Social History   Tobacco Use  . Smoking status: Former Smoker    Types: Cigarettes    Last attempt to quit: 06/09/2000    Years since quitting: 17.6  . Smokeless tobacco: Never Used  Substance Use Topics  . Alcohol use: No    Comment: Socially on weekends  . Drug use: No    Allergies: No Known Allergies  Medications Prior to Admission  Medication Sig Dispense Refill Last Dose  . acetaminophen (TYLENOL) 500 MG tablet Take 1,000 mg by mouth 2 (two) times daily as needed for moderate pain.   Taking  . Cholecalciferol (VITAMIN D3) 3000 units TABS Take 1 capsule by mouth daily.    Taking  . fish oil-omega-3 fatty acids 1000 MG capsule Take 1 g by mouth daily.   Taking  . ibuprofen (ADVIL,MOTRIN) 400 MG tablet Take 1 tablet (400 mg total) by  mouth every 6 (six) hours as needed. 30 tablet 0   . Insulin Glargine (TOUJEO SOLOSTAR) 300 UNIT/ML SOPN Inject 14 Units into the skin at bedtime.   Taking  . IRON PO Take 1 tablet by mouth daily. otc medication- not sure of dose   Taking  . lidocaine (XYLOCAINE) 2 % jelly Apply 1 application topically as needed. 30 mL 2 Taking  . lidocaine (XYLOCAINE) 2 % jelly Apply 1 application topically as needed. 30 mL 2   . methocarbamol (ROBAXIN) 500 MG tablet Take 1-2 tablets (500-1,000 mg total) by mouth every 6 (six) hours as needed for muscle spasms. 20 tablet 0   . Norgestimate-Ethinyl Estradiol Triphasic (TRINESSA, 28,) 0.18/0.215/0.25 MG-35 MCG tablet Take 1 tablet by mouth daily.   Taking  . ondansetron (ZOFRAN) 4 MG tablet Take 1 tablet (4 mg total) by mouth every 8 (eight) hours as needed for nausea or vomiting. 6 tablet 0   . pioglitazone (ACTOS) 30 MG tablet Take 30 mg by mouth daily.  3 Taking  . Probiotic Product (PROBIOTIC DAILY PO) Take 1 capsule by mouth daily.   Taking  . promethazine (PHENERGAN) 25 MG tablet Take 1 tablet (25 mg total) by mouth every 6 (six) hours as needed for nausea or vomiting. 30 tablet  0   . simvastatin (ZOCOR) 20 MG tablet Take 20 mg by mouth at bedtime.    Taking  . sitaGLIPtin-metformin (JANUMET) 50-1000 MG per tablet Take 1 tablet by mouth 2 (two) times daily with a meal.   Taking  . valACYclovir (VALTREX) 1000 MG tablet Take 1 tablet (1,000 mg total) by mouth daily. 30 tablet 12     Review of Systems  Constitutional: Negative.   Gastrointestinal: Negative.   Genitourinary: Positive for vaginal discharge. Negative for dyspareunia, dysuria, genital sores, vaginal bleeding and vaginal pain.   Physical Exam   Blood pressure 136/72, pulse 74, temperature 98.5 F (36.9 C), temperature source Oral, resp. rate 18, height 5\' 7"  (1.702 m), weight 233 lb 12 oz (106 kg), last menstrual period 01/06/2018.  Physical Exam  Nursing note and vitals  reviewed. Constitutional: She is oriented to person, place, and time. She appears well-developed and well-nourished. No distress.  HENT:  Head: Normocephalic and atraumatic.  Eyes: Conjunctivae are normal. Right eye exhibits no discharge. Left eye exhibits no discharge. No scleral icterus.  Neck: Normal range of motion.  Respiratory: Effort normal. No respiratory distress.  Genitourinary: Cervix exhibits no motion tenderness and no friability. There is erythema in the vagina. No bleeding in the vagina. Vaginal discharge (small amount of clear thin malodorous discharge) found.  Neurological: She is alert and oriented to person, place, and time.  Skin: Skin is warm and dry. She is not diaphoretic.  Psychiatric: She has a normal mood and affect. Her behavior is normal. Judgment and thought content normal.    MAU Course  Procedures Results for orders placed or performed during the hospital encounter of 01/29/18 (from the past 24 hour(s))  Urinalysis, Routine w reflex microscopic     Status: Abnormal   Collection Time: 01/29/18  8:10 AM  Result Value Ref Range   Color, Urine YELLOW YELLOW   APPearance HAZY (A) CLEAR   Specific Gravity, Urine 1.019 1.005 - 1.030   pH 5.0 5.0 - 8.0   Glucose, UA NEGATIVE NEGATIVE mg/dL   Hgb urine dipstick NEGATIVE NEGATIVE   Bilirubin Urine NEGATIVE NEGATIVE   Ketones, ur NEGATIVE NEGATIVE mg/dL   Protein, ur NEGATIVE NEGATIVE mg/dL   Nitrite NEGATIVE NEGATIVE   Leukocytes, UA SMALL (A) NEGATIVE   RBC / HPF 0-5 0 - 5 RBC/hpf   WBC, UA 0-5 0 - 5 WBC/hpf   Bacteria, UA RARE (A) NONE SEEN   Squamous Epithelial / LPF 6-30 (A) NONE SEEN   Mucus PRESENT   Pregnancy, urine POC     Status: None   Collection Time: 01/29/18  8:19 AM  Result Value Ref Range   Preg Test, Ur NEGATIVE NEGATIVE  Wet prep, genital     Status: Abnormal   Collection Time: 01/29/18  8:33 AM  Result Value Ref Range   Yeast Wet Prep HPF POC NONE SEEN NONE SEEN   Trich, Wet Prep NONE  SEEN NONE SEEN   Clue Cells Wet Prep HPF POC PRESENT (A) NONE SEEN   WBC, Wet Prep HPF POC MODERATE (A) NONE SEEN   Sperm NONE SEEN     MDM UPT negative GC/CT & wet prep collected. Patient declines blood work to complete STI testing.   Assessment and Plan  A; 1. BV (bacterial vaginosis)   2. Pregnancy examination or test, negative result    P: Discharge home Rx flagyl F/u with PCP GC/CT pending  Jorje Guild 01/29/2018, 8:31 AM

## 2018-01-29 NOTE — MAU Note (Signed)
Vaginal irritation for 2-3 days. Reports it is itchy and watery discharge, reports some odor.

## 2018-01-29 NOTE — Discharge Instructions (Signed)
In late 2019, the [facility] will be moving to the [place]. At that time, the MAU (Maternity Admissions Unit), where you are being seen today, will no longer take care of non-pregnant patients. We strongly encourage you to find a doctor's office before that time, so that you can be seen with any GYN concerns, like vaginal discharge, urinary tract infection, etc.. in a timely manner.  In order to make an office visit more convenient, the [facility] [place] [facility] will be offering evening hours with same-day appointments, walk-in appointments and scheduled appointments available during this time.  [facility] Hours: Monday - 8am - 7:30 pm with walk-in between 4pm- 7:30 pm Tuesday - 8 am - 5 pm (starting 03/01/18 we will be open late and accepting walk-ins from 4pm - 7:30pm) Wednesday - 8 am - 5 pm (starting 06/01/18 we will be open late and accepting walk-ins from 4pm - 7:30pm) Thursday 8 am - 5 pm (starting 09/01/18 we will be open late and accepting walk-ins from 4pm - 7:30pm) Friday 8 am - 5 pm  For an appointment please call the [facility] [place] [facility] at [phone]  For urgent needs, [facility] is also available for management of urgent GYN complaints such as vaginal discharge or urinary tract infections.       Some natural remedies/prevention to try for bacterial vaginosis: Take a probiotic tablet/capsule every day for at least 1-2 months.   Whenever you have symptoms, use boric acid suppositories vaginally every night for a week.   Do not use scented soaps/perfumes in the vaginal area and wear breathable cotton underwear and do not wear tight restrictive clothing.       Bacterial Vaginosis Bacterial vaginosis is a vaginal infection that occurs when the normal balance of bacteria in the vagina is disrupted. It results from an overgrowth of certain bacteria. This is the  most common vaginal infection among women ages 36-44. Because bacterial vaginosis increases your risk for STIs (sexually transmitted infections), getting treated can help reduce your risk for chlamydia, gonorrhea, herpes, and HIV (human immunodeficiency virus). Treatment is also important for preventing complications in pregnant women, because this condition can cause an early (premature) delivery. What are the causes? This condition is caused by an increase in harmful bacteria that are normally present in small amounts in the vagina. However, the reason that the condition develops is not fully understood. What increases the risk? The following factors may make you more likely to develop this condition:  Having a new sexual partner or multiple sexual partners.  Having unprotected sex.  Douching.  Having an intrauterine device (IUD).  Smoking.  Drug and alcohol abuse.  Taking certain antibiotic medicines.  Being pregnant.  You cannot get bacterial vaginosis from toilet seats, bedding, swimming pools, or contact with objects around you. What are the signs or symptoms? Symptoms of this condition include:  Grey or white vaginal discharge. The discharge can also be watery or foamy.  A fish-like odor with discharge, especially after sexual intercourse or during menstruation.  Itching in and around the vagina.  Burning or pain with urination.  Some women with bacterial vaginosis have no signs or symptoms. How is this diagnosed? This condition is diagnosed based on:  Your medical history.  A physical exam of the vagina.  Testing a sample of vaginal fluid under a microscope to look for a large amount of bad bacteria or  abnormal cells. Your health care provider may use a cotton swab or a small wooden spatula to collect the sample.  How is this treated? This condition is treated with antibiotics. These may be given as a pill, a vaginal cream, or a medicine that is put into the  vagina (suppository). If the condition comes back after treatment, a second round of antibiotics may be needed. Follow these instructions at home: Medicines  Take over-the-counter and prescription medicines only as told by your health care provider.  Take or use your antibiotic as told by your health care provider. Do not stop taking or using the antibiotic even if you start to feel better. General instructions  If you have a female sexual partner, tell her that you have a vaginal infection. She should see her health care provider and be treated if she has symptoms. If you have a female sexual partner, he does not need treatment.  During treatment: ? Avoid sexual activity until you finish treatment. ? Do not douche. ? Avoid alcohol as directed by your health care provider. ? Avoid breastfeeding as directed by your health care provider.  Drink enough water and fluids to keep your urine clear or pale yellow.  Keep the area around your vagina and rectum clean. ? Wash the area daily with warm water. ? Wipe yourself from front to back after using the toilet.  Keep all follow-up visits as told by your health care provider. This is important. How is this prevented?  Do not douche.  Wash the outside of your vagina with warm water only.  Use protection when having sex. This includes latex condoms and dental dams.  Limit how many sexual partners you have. To help prevent bacterial vaginosis, it is best to have sex with just one partner (monogamous).  Make sure you and your sexual partner are tested for STIs.  Wear cotton or cotton-lined underwear.  Avoid wearing tight pants and pantyhose, especially during summer.  Limit the amount of alcohol that you drink.  Do not use any products that contain nicotine or tobacco, such as cigarettes and e-cigarettes. If you need help quitting, ask your health care provider.  Do not use illegal drugs. Where to find more information:  Centers for  Disease Control and Prevention: AppraiserFraud.fi  American Sexual Health Association (ASHA): www.ashastd.org  U.S. Department of Health and Financial controller, Office on Women's Health: DustingSprays.pl or SecuritiesCard.it Contact a health care provider if:  Your symptoms do not improve, even after treatment.  You have more discharge or pain when urinating.  You have a fever.  You have pain in your abdomen.  You have pain during sex.  You have vaginal bleeding between periods. Summary  Bacterial vaginosis is a vaginal infection that occurs when the normal balance of bacteria in the vagina is disrupted.  Because bacterial vaginosis increases your risk for STIs (sexually transmitted infections), getting treated can help reduce your risk for chlamydia, gonorrhea, herpes, and HIV (human immunodeficiency virus). Treatment is also important for preventing complications in pregnant women, because the condition can cause an early (premature) delivery.  This condition is treated with antibiotic medicines. These may be given as a pill, a vaginal cream, or a medicine that is put into the vagina (suppository). This information is not intended to replace advice given to you by your health care provider. Make sure you discuss any questions you have with your health care provider. Document Released: 11/16/2005 Document Revised: 03/22/2017 Document Reviewed: 08/01/2016 Elsevier Interactive Patient Education  2018 [employer].

## 2018-01-31 LAB — GC/CHLAMYDIA PROBE AMP (~~LOC~~) NOT AT ARMC
CHLAMYDIA, DNA PROBE: NEGATIVE
NEISSERIA GONORRHEA: NEGATIVE

## 2018-05-18 ENCOUNTER — Encounter: Payer: Self-pay | Admitting: Physical Therapy

## 2018-05-18 ENCOUNTER — Ambulatory Visit: Payer: Medicaid Other | Attending: Neurosurgery | Admitting: Physical Therapy

## 2018-05-18 ENCOUNTER — Other Ambulatory Visit: Payer: Self-pay

## 2018-05-18 DIAGNOSIS — M6281 Muscle weakness (generalized): Secondary | ICD-10-CM | POA: Diagnosis present

## 2018-05-18 DIAGNOSIS — G8929 Other chronic pain: Secondary | ICD-10-CM | POA: Diagnosis present

## 2018-05-18 DIAGNOSIS — R293 Abnormal posture: Secondary | ICD-10-CM

## 2018-05-18 DIAGNOSIS — M545 Low back pain, unspecified: Secondary | ICD-10-CM
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
## 2018-05-31 ENCOUNTER — Encounter: Payer: Self-pay | Admitting: Physical Therapy

## 2018-05-31 ENCOUNTER — Ambulatory Visit: Payer: Medicaid Other | Attending: Neurosurgery | Admitting: Physical Therapy

## 2018-05-31 DIAGNOSIS — M545 Low back pain, unspecified: Secondary | ICD-10-CM

## 2018-05-31 DIAGNOSIS — R293 Abnormal posture: Secondary | ICD-10-CM

## 2018-05-31 DIAGNOSIS — G8929 Other chronic pain: Secondary | ICD-10-CM | POA: Insufficient documentation

## 2018-05-31 DIAGNOSIS — M6281 Muscle weakness (generalized): Secondary | ICD-10-CM | POA: Diagnosis present

## 2018-05-31 NOTE — Therapy (Signed)
[place], [place], [address] Phone: [phone]   Fax:  [phone]  Physical Therapy Treatment  Patient Details  Name: Tanya Austin MRN: [id number] Date of Birth: [birth year] Referring Provider: Jairo Ben MD   Encounter Date: 05/31/2018  PT End of Session - 05/31/18 0932    Visit Number  2    Number of Visits  13    Date for PT Re-Evaluation  07/13/18    PT Start Time  0932    PT Stop Time  1013    PT Time Calculation (min)  41 min    Activity Tolerance  Patient tolerated treatment well    Behavior During Therapy  [facility] for tasks assessed/performed       Past Medical History:  Diagnosis Date  . Diabetes mellitus   . Fibroid   . Headache   . Herpes   . High cholesterol   . Hx of gonorrhea 2017    Past Surgical History:  Procedure Laterality Date  . CERVICAL BIOPSY      There were no vitals filed for this visit.  Subjective Assessment - 05/31/18 0931    Subjective  "I've been doing my exercises and I don't feel any pain today"     Currently in Pain?  Yes    Pain Score  0-No pain    Pain Orientation  Lower    Pain Frequency  Occasional    Aggravating Factors   moving specific way, walking    Pain Relieving Factors  heating pad                        [facility] Adult PT Treatment/Exercise - 05/31/18 0936      Lumbar Exercises: Stretches   Active Hamstring Stretch  Left;4 reps;30 seconds PNF contract/ relax with 10 sec contraction    Single Knee to Chest Stretch  Left;3 reps;30 seconds    Lower Trunk Rotation Limitations  1 x 20    Quadruped Mid Back Stretch  2 reps;30 seconds childs pose stretch      Lumbar Exercises: Aerobic   Nustep  L5 x 6 min UE/LE       Lumbar Exercises: Supine   Bent Knee Raise  15 reps keeping core tight throughout exercise    Dead Bug  5 reps keeping back flat holding 10 sec, cues for proper form    Straight Leg Raise  15 reps x 1  set with sustained quad contraction      Lumbar Exercises: Quadruped   Madcat/Old Horse  10 reps 5 sec ea.       Manual Therapy   Manual Therapy  Muscle Energy Technique    Manual therapy comments  MTPR along L/R  lumbar paraspinals     Muscle Energy Technique  L hip flexor MET with pt in supine, 5 x 10 sec hold             PT Education - 05/31/18 0956    Education Details  reviewed previously provided HEP and updated for childs pose and cat/ cow exercise.     Person(s) Educated  Patient    Methods  Explanation;Verbal cues;Handout    Comprehension  Verbalized understanding;Verbal cues required;Returned demonstration       PT Short Term Goals - 05/18/18 1437      PT SHORT TERM GOAL #1   Title  pt will be indepedent with HEP    Baseline  pt unaware of exercises to perform    Time  3    Period  Weeks    Target Date  06/08/18      PT SHORT TERM GOAL #2   Title  Pt will verbalize and demonstrate improved posture with sitting, standing, & lifting mechanics to decrease and reduce reinjury of low back.    Baseline  pt with no knowledge of proper posture    Time  3    Period  Weeks    Status  New    Target Date  06/08/18      PT SHORT TERM GOAL #3   Title  Pt to demonstrate decreased paraspinal tightness with </= 2/10 pain to promote trunk mobility and therapeutic progression    Baseline  pt with 10/10 pain initially and spasm on palpation.     Time  3    Period  Weeks    Status  New    Target Date  06/08/18        PT Long Term Goals - 05/18/18 1438      PT LONG TERM GOAL #1   Title  Pt will show indepedent with HEP to ensure progress in physical therapy    Baseline  pt does not know exercises to perform at home    Time  6    Period  Weeks    Status  New    Target Date  06/29/18      PT LONG TERM GOAL #2   Title  Pt will increase L hip strength to >/= 4+ in order to improve functional mobility and decrease pain to </= 2/10.    Baseline  pt's L hip strength all  4/5    Time  6    Period  Weeks    Status  New    Target Date  06/29/18      PT LONG TERM GOAL #3   Title  Pt will report no pain with lumbar AROM (in all directions) to improve quality of life and functional mobility.     Baseline  pt experiences pain with lumbar AROM in all directions.    Time  6    Period  Weeks    Status  New    Target Date  06/29/18      PT LONG TERM GOAL #4   Title  Pt will be able to stand for >/= 10 min with pain level </= 2/10 in order to be able to perform ADLs and basic housekeeping independently.    Baseline  pt always has pain with standing    Time  6    Period  Months    Status  New    Target Date  06/29/18            Plan - 05/31/18 0957    Clinical Impression Statement  pt reports consistency with her HEP and states she has no paint today but intermittent soreness. continued manual for SIJ posterior rotation on the L with stretching and MET of the hip flexors. soft tissue work for paraspinals and core activation techniques. updated HEP for cat / cow and childs pose. She performed all exercises well with no complains of pain.     PT Next Visit Plan  update HEP, hamstring stretching, hip strengthening, posterior innominate rotation on the L, core strength    PT Home Exercise Plan  hamstring stretch, SLR, supine marches, lower trunk rotations, cat/ cow exercise and chils pose  Consulted and Agree with Plan of Care  Patient       Patient will benefit from skilled therapeutic intervention in order to improve the following deficits and impairments:  Abnormal gait, Decreased activity tolerance, Decreased strength, Pain, Decreased mobility, Decreased range of motion, Postural dysfunction, Hypomobility  Visit Diagnosis: Chronic left-sided low back pain without sciatica  Muscle weakness (generalized)  Abnormal posture     Problem List Patient Active Problem List   Diagnosis Date Noted  . Vaginal candidiasis 02/23/2017  . Bacterial  vaginosis 08/04/2016   Starr Lake PT, DPT, LAT, ATC  05/31/18  10:12 AM      [facility] [address] [place], [place], [address] Phone: [phone]   Fax:  [phone]  Name: Tanya Austin MRN: [id number] Date of Birth: [birth year]

## 2018-06-14 ENCOUNTER — Encounter: Payer: Self-pay | Admitting: Physical Therapy

## 2018-06-14 ENCOUNTER — Ambulatory Visit: Payer: Medicaid Other | Admitting: Physical Therapy

## 2018-06-14 DIAGNOSIS — G8929 Other chronic pain: Secondary | ICD-10-CM

## 2018-06-14 DIAGNOSIS — M545 Low back pain, unspecified: Secondary | ICD-10-CM

## 2018-06-14 DIAGNOSIS — M6281 Muscle weakness (generalized): Secondary | ICD-10-CM

## 2018-06-14 DIAGNOSIS — R293 Abnormal posture: Secondary | ICD-10-CM

## 2018-06-14 NOTE — Therapy (Addendum)
[place], [place], [address] Phone: [phone]   Fax:  [phone]  Physical Therapy Treatment/Re-certification  Patient Details  Name: Tanya Austin MRN: [id number] Date of Birth: [birth year] Referring Provider: Jairo Ben MD   Encounter Date: 06/14/2018  PT End of Session - 06/14/18 1024    Visit Number  3    Number of Visits  13    Date for PT Re-Evaluation  07/13/18    PT Start Time  0943 pt 13 min late    PT Stop Time  1015    PT Time Calculation (min)  32 min    Activity Tolerance  Patient tolerated treatment well    Behavior During Therapy  [facility] for tasks assessed/performed       Past Medical History:  Diagnosis Date  . Diabetes mellitus   . Fibroid   . Headache   . Herpes   . High cholesterol   . Hx of gonorrhea 2017    Past Surgical History:  Procedure Laterality Date  . CERVICAL BIOPSY      There were no vitals filed for this visit.  Subjective Assessment - 06/14/18 0943    Subjective  "I am feeling okay today."    Currently in Pain?  Yes    Pain Score  7     Pain Location  Back    Pain Orientation  Lower    Pain Descriptors / Indicators  Aching    Pain Type  Chronic pain         [facility] PT Assessment - 06/14/18 0001      AROM   Lumbar Flexion  90    Lumbar Extension  30 slighty painful    Lumbar - Right Side Bend  20    Lumbar - Left Side Bend  20 slightly painful      Strength   Right Hip Flexion  5/5    Right Hip Extension  4/5 prone    Right Hip ABduction  4+/5    Left Hip Flexion  5/5    Left Hip Extension  4/5 prone    Left Hip ABduction  4+/5                   [facility] Adult PT Treatment/Exercise - 06/14/18 0001      Lumbar Exercises: Stretches   Active Hamstring Stretch  30 seconds;Left;1 rep PNF hold relax    Single Knee to Chest Stretch  Left;30 seconds;Right;1 rep    Quadruped Mid Back Stretch  2 reps;30 seconds childs pose       Lumbar Exercises: Supine   Heel Slides  10 reps cues for pelvic tilt      Lumbar Exercises: Quadruped   Madcat/Old Horse  10 reps 5 sec each      Manual Therapy   Manual Therapy  Soft tissue mobilization    Soft tissue mobilization  MTPr L glut med    Muscle Energy Technique  L hip flexor MET with pt in supine, 6 x 5 sec hold pt reports decreased pain with lumbar ext and L SB              PT Short Term Goals - 06/14/18 1009      PT SHORT TERM GOAL #1   Title  pt will be indepedent with HEP    Status  Achieved      PT SHORT TERM GOAL #2   Title  Pt will  verbalize and demonstrate improved posture with sitting, standing, & lifting mechanics to decrease and reduce reinjury of low back.    Status  Achieved      PT SHORT TERM GOAL #3   Title  Pt to demonstrate decreased paraspinal tightness with </= 2/10 pain to promote trunk mobility and therapeutic progression    Status  Achieved        PT Long Term Goals - 06/14/18 1010      PT LONG TERM GOAL #1   Title  Pt will show indepedent with HEP to ensure progress in physical therapy    Baseline  Pt performing exercises at home    Status  On-going      PT LONG TERM GOAL #2   Title  Pt will increase L hip strength to >/= 4+ in order to improve functional mobility and decrease pain to </= 2/10.    Baseline  hip flex 5/5, ext 4/5, and abd 4+/5 bilaterally    Status  On-going      PT LONG TERM GOAL #3   Title  Pt will report no pain with lumbar AROM (in all directions) to improve quality of life and functional mobility.     Baseline  only reports pain with flexion and L SB    Status  On-going      PT LONG TERM GOAL #4   Title  Pt will be able to stand for >/= 10 min with pain level </= 2/10 in order to be able to perform ADLs and basic housekeeping independently.    Baseline  Pt states she can stand and make an entire meal without pain    Status  Achieved            Plan - 06/14/18 1018    Clinical Impression  Statement  Pt reports she has not been performing her exercises since her last visit because she has been busy. She reports 7/10 pain on arrival today. Reassessment performed. Pt demonstrates increased lumbar AROM with decreased pain as compared to initial evaluation. Additionally, she has maintained or increased her hip strength in all planes. She met all of her short term goals and is motivated to continue with PT. She is making progress towards all of her LTGs. Treatment focused on stretching, core strengthening, MTPr of L glute med, and MET for L SIJ. She reports 0/10 pain at end of session. She has only attended 2/3 approved visits due to scheduling conflict. Pt would benefit from continued OPPT services to address pain, ROM, strength, and functional mobility.     Rehab Potential  Good    PT Frequency  2x / week    PT Duration  4 weeks    PT Treatment/Interventions  ADLs/Self Care Home Management;Cryotherapy;Electrical Stimulation;Moist Heat;Ultrasound;Gait training;Stair training;Functional mobility training;Therapeutic activities;Therapeutic exercise;Balance training;Neuromuscular re-education;Patient/family education;Manual techniques;Passive range of motion;Dry needling;Taping    PT Next Visit Plan  update HEP, hamstring stretching, hip strengthening, posterior innominate rotation on the L, core strength, MTPr of L glute med prn.     PT Home Exercise Plan  hamstring stretch, SLR, supine marches, lower trunk rotations, cat/ cow exercise and chils pose    Consulted and Agree with Plan of Care  Patient       Patient will benefit from skilled therapeutic intervention in order to improve the following deficits and impairments:  Abnormal gait, Decreased activity tolerance, Decreased strength, Pain, Decreased mobility, Decreased range of motion, Postural dysfunction, Hypomobility  Visit Diagnosis: Chronic left-sided low back  pain without sciatica - Plan: PT plan of care cert/re-cert  Muscle  weakness (generalized) - Plan: PT plan of care cert/re-cert  Abnormal posture - Plan: PT plan of care cert/re-cert     Problem List Patient Active Problem List   Diagnosis Date Noted  . Vaginal candidiasis 02/23/2017  . Bacterial vaginosis 08/04/2016   Worthy Flank, SPT 06/14/18 12:39 PM   [facility] [address] [place], [place], [address] Phone: [phone]   Fax:  [phone]  Name: Tanya Austin MRN: [id number] Date of Birth: 1970/12/10

## 2018-06-21 ENCOUNTER — Ambulatory Visit: Payer: Medicaid Other | Admitting: Physical Therapy

## 2018-06-21 ENCOUNTER — Encounter: Payer: Self-pay | Admitting: Physical Therapy

## 2018-06-21 DIAGNOSIS — M545 Low back pain, unspecified: Secondary | ICD-10-CM

## 2018-06-21 DIAGNOSIS — G8929 Other chronic pain: Secondary | ICD-10-CM

## 2018-06-21 DIAGNOSIS — R293 Abnormal posture: Secondary | ICD-10-CM

## 2018-06-21 DIAGNOSIS — M6281 Muscle weakness (generalized): Secondary | ICD-10-CM

## 2018-06-21 NOTE — Therapy (Signed)
[place], [place], [address] Phone: [phone]   Fax:  [phone]  Physical Therapy Treatment  Patient Details  Name: Tanya Austin MRN: [id number] Date of Birth: 11-Dec-1970 Referring Provider: Jairo Ben MD   Encounter Date: 06/21/2018  PT End of Session - 06/21/18 0952    Visit Number  4    Number of Visits  13    Date for PT Re-Evaluation  07/13/18    PT Start Time  0933    PT Stop Time  1014    PT Time Calculation (min)  41 min    Activity Tolerance  Patient tolerated treatment well    Behavior During Therapy  [facility] for tasks assessed/performed       Past Medical History:  Diagnosis Date  . Diabetes mellitus   . Fibroid   . Headache   . Herpes   . High cholesterol   . Hx of gonorrhea 2017    Past Surgical History:  Procedure Laterality Date  . CERVICAL BIOPSY      There were no vitals filed for this visit.  Subjective Assessment - 06/21/18 0933    Subjective  "I had to walk to get here, I don't have any pain today but I haven't been able to my exercises at home"    Currently in Pain?  No/denies    Pain Location  Back    Pain Orientation  Lower    Pain Type  Chronic pain    Pain Frequency  Intermittent    Aggravating Factors   walking, standing for too long    Pain Relieving Factors  sitting/ resting, laying down                       [facility] Adult PT Treatment/Exercise - 06/21/18 0938      Lumbar Exercises: Stretches   Active Hamstring Stretch  3 reps;30 seconds    Single Knee to Chest Stretch  2 reps;Left;30 seconds    Lower Trunk Rotation Limitations  1 x 20 reps      Lumbar Exercises: Aerobic   Nustep  L5 x 6 min UE/LE       Lumbar Exercises: Seated   Sit to Stand  -- 2 x 10 from table with glute squeeze, cues for slowed descen      Lumbar Exercises: Supine   Dead Bug  10 reps 10 sec hold      Knee/Hip Exercises: Standing   Hip Abduction   Both;2 sets;10 reps;Knee straight    Hip Extension  2 sets;10 reps;Both             PT Education - 06/21/18 0954    Education Details  importance of doing exercises at home and reviewed HEP    Person(s) Educated  Patient    Methods  Explanation;Verbal cues    Comprehension  Verbalized understanding;Verbal cues required       PT Short Term Goals - 06/14/18 1009      PT SHORT TERM GOAL #1   Title  pt will be indepedent with HEP    Status  Achieved      PT SHORT TERM GOAL #2   Title  Pt will verbalize and demonstrate improved posture with sitting, standing, & lifting mechanics to decrease and reduce reinjury of low back.    Status  Achieved      PT SHORT TERM GOAL #3   Title  Pt to  demonstrate decreased paraspinal tightness with </= 2/10 pain to promote trunk mobility and therapeutic progression    Status  Achieved        PT Long Term Goals - 06/14/18 1010      PT LONG TERM GOAL #1   Title  Pt will show indepedent with HEP to ensure progress in physical therapy    Baseline  Pt performing exercises at home    Status  On-going      PT LONG TERM GOAL #2   Title  Pt will increase L hip strength to >/= 4+ in order to improve functional mobility and decrease pain to </= 2/10.    Baseline  hip flex 5/5, ext 4/5, and abd 4+/5 bilaterally    Status  On-going      PT LONG TERM GOAL #3   Title  Pt will report no pain with lumbar AROM (in all directions) to improve quality of life and functional mobility.     Baseline  only reports pain with flexion and L SB    Status  On-going      PT LONG TERM GOAL #4   Title  Pt will be able to stand for >/= 10 min with pain level </= 2/10 in order to be able to perform ADLs and basic housekeeping independently.    Baseline  Pt states she can stand and make an entire meal without pain    Status  Achieved            Plan - 06/21/18 0952    Clinical Impression Statement  pt reports no pain todaty but states she hasn't been  consistent with her HEP. continued working on hip and core strengthening which she peformed well. continued to reiterate the importance of doing exercises at home to promote pain relief and carry over. end of session she reported continued no pain and declined modalities.     PT Treatment/Interventions  ADLs/Self Care Home Management;Cryotherapy;Electrical Stimulation;Moist Heat;Ultrasound;Gait training;Stair training;Functional mobility training;Therapeutic activities;Therapeutic exercise;Balance training;Neuromuscular re-education;Patient/family education;Manual techniques;Passive range of motion;Dry needling;Taping    PT Next Visit Plan  update HEP, hamstring stretching, hip strengthening, posterior innominate rotation on the L, core strength, hip strength    PT Home Exercise Plan  hamstring stretch, SLR, supine marches, lower trunk rotations, cat/ cow exercise and chils pose, dead bug    Consulted and Agree with Plan of Care  Patient       Patient will benefit from skilled therapeutic intervention in order to improve the following deficits and impairments:  Abnormal gait, Decreased activity tolerance, Decreased strength, Pain, Decreased mobility, Decreased range of motion, Postural dysfunction, Hypomobility  Visit Diagnosis: Chronic left-sided low back pain without sciatica  Muscle weakness (generalized)  Abnormal posture     Problem List Patient Active Problem List   Diagnosis Date Noted  . Vaginal candidiasis 02/23/2017  . Bacterial vaginosis 08/04/2016   Starr Lake PT, DPT, LAT, ATC  06/21/18  10:11 AM      [facility] [address] [place], [place], [address] Phone: [phone]   Fax:  [phone]  Name: Tanya Austin MRN: [id number] Date of Birth: 1971/05/12

## 2018-06-28 ENCOUNTER — Ambulatory Visit: Payer: Medicaid Other | Admitting: Physical Therapy

## 2018-06-28 ENCOUNTER — Encounter: Payer: Self-pay | Admitting: Physical Therapy

## 2018-06-28 DIAGNOSIS — M545 Low back pain, unspecified: Secondary | ICD-10-CM

## 2018-06-28 DIAGNOSIS — R293 Abnormal posture: Secondary | ICD-10-CM

## 2018-06-28 DIAGNOSIS — M6281 Muscle weakness (generalized): Secondary | ICD-10-CM

## 2018-06-28 DIAGNOSIS — G8929 Other chronic pain: Secondary | ICD-10-CM

## 2018-06-28 NOTE — Patient Instructions (Addendum)
External Rotation: Hip - Knees Apart With Pelvic Floor (Side-Lying)    Lie on left side with hips and knees bent, band tied just above knees. Squeeze abdominals while lifting top knee. Hold for _5__ seconds. Slowly lower.  Repeat _10-15__ times. Do __2_ times a day.   Bridge    Lie back, legs bent. Inhale, pressing hips up. Keeping abdominals tight. HOLD 5 seconds  Slowly lower down.  Repeat __10__ times. Do __2__ sessions per day.

## 2018-06-28 NOTE — Therapy (Signed)
[place], [place], [address] Phone: [phone]   Fax:  [phone]  Physical Therapy Treatment  Patient Details  Name: Tanya Austin MRN: [id number] Date of Birth: 1970/12/07 Referring Provider: Jairo Ben MD   Encounter Date: 06/28/2018  PT End of Session - 06/28/18 0942    Visit Number  5    Number of Visits  13    Date for PT Re-Evaluation  07/13/18    PT Start Time  0937    PT Stop Time  1018    PT Time Calculation (min)  41 min       Past Medical History:  Diagnosis Date  . Diabetes mellitus   . Fibroid   . Headache   . Herpes   . High cholesterol   . Hx of gonorrhea 2017    Past Surgical History:  Procedure Laterality Date  . CERVICAL BIOPSY      There were no vitals filed for this visit.  Subjective Assessment - 06/28/18 0939    Subjective  I walked here from the hospital. As long as I take my time, I am okay.     Currently in Pain?  Yes    Pain Score  2     Pain Location  Back    Pain Orientation  Lower;Mid;Left;Right    Pain Descriptors / Indicators  Aching    Pain Type  Chronic pain    Pain Radiating Towards  no longer radiates     Aggravating Factors   walking, standing too long    Pain Relieving Factors  sitting, resting , laying down                       [facility] Adult PT Treatment/Exercise - 06/28/18 0001      Lumbar Exercises: Stretches   Active Hamstring Stretch  3 reps;30 seconds    Piriformis Stretch  2 reps;20 seconds    Figure 4 Stretch  2 reps;20 seconds      Lumbar Exercises: Aerobic   Nustep  L5 x 6 min UE/LE       Lumbar Exercises: Supine   Clam  20 reps green bilat, unilat    Bent Knee Raise  15 reps keeping core tight throughout exercise    Bent Knee Raise Limitations  feet up into table top one at a time and then lower one at a time x 10     Bridge  10 reps    Straight Leg Raise  15 reps abdominal draw in      Lumbar  Exercises: Sidelying   Clam  10 reps;Left;Right green    Hip Abduction  10 reps;Left;Right             PT Education - 06/28/18 1001    Education Details  HEP     Person(s) Educated  Patient    Methods  Explanation;Handout    Comprehension  Verbalized understanding       PT Short Term Goals - 06/14/18 1009      PT SHORT TERM GOAL #1   Title  pt will be indepedent with HEP    Status  Achieved      PT SHORT TERM GOAL #2   Title  Pt will verbalize and demonstrate improved posture with sitting, standing, & lifting mechanics to decrease and reduce reinjury of low back.    Status  Achieved      PT SHORT TERM  GOAL #3   Title  Pt to demonstrate decreased paraspinal tightness with </= 2/10 pain to promote trunk mobility and therapeutic progression    Status  Achieved        PT Long Term Goals - 06/14/18 1010      PT LONG TERM GOAL #1   Title  Pt will show indepedent with HEP to ensure progress in physical therapy    Baseline  Pt performing exercises at home    Status  On-going      PT LONG TERM GOAL #2   Title  Pt will increase L hip strength to >/= 4+ in order to improve functional mobility and decrease pain to </= 2/10.    Baseline  hip flex 5/5, ext 4/5, and abd 4+/5 bilaterally    Status  On-going      PT LONG TERM GOAL #3   Title  Pt will report no pain with lumbar AROM (in all directions) to improve quality of life and functional mobility.     Baseline  only reports pain with flexion and L SB    Status  On-going      PT LONG TERM GOAL #4   Title  Pt will be able to stand for >/= 10 min with pain level </= 2/10 in order to be able to perform ADLs and basic housekeeping independently.    Baseline  Pt states she can stand and make an entire meal without pain    Status  Achieved            Plan - 06/28/18 1040    Clinical Impression Statement  Low level pain today after walking from hospital bus stop. Quick to fatigue with lateral hip strength in sidelying.  Updated HEP with resisted clam and bridge. No increased pain.     PT Next Visit Plan  update HEP, hamstring stretching, hip strengthening, posterior innominate rotation on the L, core strength, hip strength    PT Home Exercise Plan  hamstring stretch, SLR, supine marches, lower trunk rotations, cat/ cow exercise and chils pose, dead bug, bridge, side  clam green band    Consulted and Agree with Plan of Care  Patient       Patient will benefit from skilled therapeutic intervention in order to improve the following deficits and impairments:  Abnormal gait, Decreased activity tolerance, Decreased strength, Pain, Decreased mobility, Decreased range of motion, Postural dysfunction, Hypomobility  Visit Diagnosis: Chronic left-sided low back pain without sciatica  Muscle weakness (generalized)  Abnormal posture     Problem List Patient Active Problem List   Diagnosis Date Noted  . Vaginal candidiasis 02/23/2017  . Bacterial vaginosis 08/04/2016    Dorene Ar, PTA 06/28/2018, 10:44 AM  [facility] [address] [place], [place], [address] Phone: [phone]   Fax:  [phone]  Name: Tanya Austin MRN: [id number] Date of Birth: [birth year]

## 2018-06-29 ENCOUNTER — Ambulatory Visit: Payer: Medicaid Other | Admitting: Physical Therapy

## 2018-06-29 ENCOUNTER — Encounter: Payer: Self-pay | Admitting: Physical Therapy

## 2018-06-29 DIAGNOSIS — M545 Low back pain, unspecified: Secondary | ICD-10-CM

## 2018-06-29 DIAGNOSIS — G8929 Other chronic pain: Secondary | ICD-10-CM

## 2018-06-29 DIAGNOSIS — M6281 Muscle weakness (generalized): Secondary | ICD-10-CM

## 2018-06-29 DIAGNOSIS — R293 Abnormal posture: Secondary | ICD-10-CM

## 2018-06-29 NOTE — Therapy (Signed)
[place], [place], [address] Phone: [phone]   Fax:  [phone]  Physical Therapy Treatment  Patient Details  Name: Tanya Austin MRN: [id number] Date of Birth: Aug 03, 1971 Referring Provider: Jairo Ben MD   Encounter Date: 06/29/2018  PT End of Session - 06/29/18 1014    Visit Number  6    Number of Visits  13    Date for PT Re-Evaluation  07/13/18    PT Start Time  0935    PT Stop Time  1013    PT Time Calculation (min)  38 min    Activity Tolerance  Patient tolerated treatment well    Behavior During Therapy  [facility] for tasks assessed/performed       Past Medical History:  Diagnosis Date  . Diabetes mellitus   . Fibroid   . Headache   . Herpes   . High cholesterol   . Hx of gonorrhea 2017    Past Surgical History:  Procedure Laterality Date  . CERVICAL BIOPSY      There were no vitals filed for this visit.  Subjective Assessment - 06/29/18 0937    Subjective  I'm doing OK, I'm still having pain from time to time. Exercise has been helping.     Patient Stated Goals  decrease pain, get back to everything    Currently in Pain?  Yes    Pain Score  5     Pain Location  Back    Pain Orientation  Right;Lower    Pain Descriptors / Indicators  Aching;Dull    Pain Type  Chronic pain    Pain Radiating Towards  no longer radiating                        [facility] Adult PT Treatment/Exercise - 06/29/18 0001      Lumbar Exercises: Stretches   Active Hamstring Stretch  2 reps;30 seconds    Single Knee to Chest Stretch  5 reps;10 seconds    Lower Trunk Rotation  5 reps;10 seconds    Pelvic Tilt  10 reps;5 seconds    Figure 4 Stretch  2 reps;30 seconds      Lumbar Exercises: Seated   Long Arc Quad on [place]  Both;1 set;10 reps UEs overhead, core set, green air pad     Hip Flexion on Ball  Both;10 reps on green air pad, core set     Other Seated Lumbar Exercises  4  way trunk leans x5 on green air pad feet on floor       Lumbar Exercises: Supine   Bent Knee Raise  10 reps feet up in the air, lowering to table one at a time     Bridge  15 reps;2 seconds    Other Supine Lumbar Exercises  bent knee lower both legs at one time 1x10     Other Supine Lumbar Exercises  bent knee rotations x5 B with feet off table to tolerated range              PT Education - 06/29/18 1013    Education Details  exericse form, possible DOMS and management strategies     Person(s) Educated  Patient    Methods  Explanation    Comprehension  Verbalized understanding       PT Short Term Goals - 06/14/18 1009      PT SHORT TERM GOAL #1   Title  pt will be indepedent with HEP    Status  Achieved      PT SHORT TERM GOAL #2   Title  Pt will verbalize and demonstrate improved posture with sitting, standing, & lifting mechanics to decrease and reduce reinjury of low back.    Status  Achieved      PT SHORT TERM GOAL #3   Title  Pt to demonstrate decreased paraspinal tightness with </= 2/10 pain to promote trunk mobility and therapeutic progression    Status  Achieved        PT Long Term Goals - 06/14/18 1010      PT LONG TERM GOAL #1   Title  Pt will show indepedent with HEP to ensure progress in physical therapy    Baseline  Pt performing exercises at home    Status  On-going      PT LONG TERM GOAL #2   Title  Pt will increase L hip strength to >/= 4+ in order to improve functional mobility and decrease pain to </= 2/10.    Baseline  hip flex 5/5, ext 4/5, and abd 4+/5 bilaterally    Status  On-going      PT LONG TERM GOAL #3   Title  Pt will report no pain with lumbar AROM (in all directions) to improve quality of life and functional mobility.     Baseline  only reports pain with flexion and L SB    Status  On-going      PT LONG TERM GOAL #4   Title  Pt will be able to stand for >/= 10 min with pain level </= 2/10 in order to be able to perform ADLs and  basic housekeeping independently.    Baseline  Pt states she can stand and make an entire meal without pain    Status  Achieved            Plan - 06/29/18 1026    Clinical Impression Statement  Patient arrives doing well, reporting exercises seem to be helping. Continued focus on functional stretching and core/hip strengthening today as indicated in PT POC; introduced new and progressions of  exercises, which patient appeared to tolerate well but expresses fatigue with new activities. She appears to be doing well with skilled PT services.     Rehab Potential  Good    PT Frequency  2x / week    PT Duration  4 weeks    PT Treatment/Interventions  ADLs/Self Care Home Management;Cryotherapy;Electrical Stimulation;Moist Heat;Ultrasound;Gait training;Stair training;Functional mobility training;Therapeutic activities;Therapeutic exercise;Balance training;Neuromuscular re-education;Patient/family education;Manual techniques;Passive range of motion;Dry needling;Taping    PT Next Visit Plan  update HEP, hamstring stretching, hip strengthening, posterior innominate rotation on the L, core strength, hip strength    PT Home Exercise Plan  hamstring stretch, SLR, supine marches, lower trunk rotations, cat/ cow exercise and chils pose, dead bug, bridge, side  clam green band    Consulted and Agree with Plan of Care  Patient       Patient will benefit from skilled therapeutic intervention in order to improve the following deficits and impairments:  Abnormal gait, Decreased activity tolerance, Decreased strength, Pain, Decreased mobility, Decreased range of motion, Postural dysfunction, Hypomobility  Visit Diagnosis: Chronic left-sided low back pain without sciatica  Muscle weakness (generalized)  Abnormal posture     Problem List Patient Active Problem List   Diagnosis Date Noted  . Vaginal candidiasis 02/23/2017  . Bacterial vaginosis 08/04/2016    Deniece Ree PT, DPT,  [place]   Pager [facility] [address] [place], [place], [address] Phone: [phone]   Fax:  [phone]  Name: JUNITA KUBOTA MRN: [id number] Date of Birth: [birth year]

## 2018-07-05 ENCOUNTER — Ambulatory Visit: Payer: Medicaid Other | Attending: Neurosurgery | Admitting: Physical Therapy

## 2018-07-05 ENCOUNTER — Encounter: Payer: Self-pay | Admitting: Physical Therapy

## 2018-07-05 DIAGNOSIS — G8929 Other chronic pain: Secondary | ICD-10-CM | POA: Insufficient documentation

## 2018-07-05 DIAGNOSIS — M6281 Muscle weakness (generalized): Secondary | ICD-10-CM | POA: Insufficient documentation

## 2018-07-05 DIAGNOSIS — R293 Abnormal posture: Secondary | ICD-10-CM | POA: Insufficient documentation

## 2018-07-05 DIAGNOSIS — M545 Low back pain, unspecified: Secondary | ICD-10-CM

## 2018-07-05 NOTE — Therapy (Signed)
[place], [place], [address] Phone: [phone]   Fax:  [phone]  Physical Therapy Treatment  Patient Details  Name: Tanya Austin MRN: [id number] Date of Birth: 1971/05/16 Referring Provider: Jairo Ben MD   Encounter Date: 07/05/2018  PT End of Session - 07/05/18 0949    Visit Number  7    Number of Visits  13    Date for PT Re-Evaluation  07/13/18    PT Start Time  0930    PT Stop Time  1010    PT Time Calculation (min)  40 min    Activity Tolerance  Patient tolerated treatment well    Behavior During Therapy  [facility] for tasks assessed/performed       Past Medical History:  Diagnosis Date  . Diabetes mellitus   . Fibroid   . Headache   . Herpes   . High cholesterol   . Hx of gonorrhea 2017    Past Surgical History:  Procedure Laterality Date  . CERVICAL BIOPSY      There were no vitals filed for this visit.  Subjective Assessment - 07/05/18 0935    Subjective  No pain today.  I don't feel well today.  A bit nauseous    Currently in Pain?  No/denies         [facility] Adult PT Treatment/Exercise - 07/05/18 0001      Lumbar Exercises: Stretches   Active Hamstring Stretch  2 reps    Single Knee to Chest Stretch  5 reps    Lower Trunk Rotation  5 reps    Figure 4 Stretch  2 reps;30 seconds      Lumbar Exercises: Supine   Pelvic Tilt  15 reps    Bent Knee Raise  10 reps feet up in the air, lowering to table one at a time     Bent Knee Raise Limitations  max cues for technique, multiple reps     Bridge  10 reps    Straight Leg Raise  15 reps abdominal draw in      Lumbar Exercises: Sidelying   Clam  --    Hip Abduction  --      Lumbar Exercises: Quadruped   Madcat/Old Horse  --    Other Quadruped Lumbar Exercises  childs pose x 3 x 30 sec     Other Quadruped Lumbar Exercises  sink stretch to mimick cat/camel x 3      Knee/Hip Exercises: Standing   Hip Abduction   Both;2 sets;10 reps;Knee straight    Hip Extension  Both;2 sets;10 reps    Forward Step Up  Both;1 set;15 reps    Wall Squat  10 reps    Other Standing Knee Exercises  calf raise and stretch off step x 20              PT Education - 07/05/18 0949    Education Details  HEP technique, core     Person(s) Educated  Patient    Methods  Explanation    Comprehension  Verbalized understanding       PT Short Term Goals - 07/05/18 1034      PT SHORT TERM GOAL #1   Title  pt will be indepedent with HEP    Status  Achieved      PT SHORT TERM GOAL #2   Title  Pt will verbalize and demonstrate improved posture with sitting, standing, & lifting mechanics to  decrease and reduce reinjury of low back.    Status  Achieved      PT SHORT TERM GOAL #3   Title  Pt to demonstrate decreased paraspinal tightness with </= 2/10 pain to promote trunk mobility and therapeutic progression    Status  Achieved        PT Long Term Goals - 07/05/18 1035      PT LONG TERM GOAL #1   Title  Pt will show indepedent with HEP to ensure progress in physical therapy    Status  On-going      PT LONG TERM GOAL #2   Title  Pt will increase L hip strength to >/= 4+ in order to improve functional mobility and decrease pain to </= 2/10.    Status  Unable to assess      PT LONG TERM GOAL #3   Title  Pt will report no pain with lumbar AROM (in all directions) to improve quality of life and functional mobility.     Status  Unable to assess      PT LONG TERM GOAL #4   Title  Pt will be able to stand for >/= 10 min with pain level </= 2/10 in order to be able to perform ADLs and basic housekeeping independently.    Status  Achieved            Plan - 07/05/18 0953    Clinical Impression Statement  Patient feeling a bit ill today, due to medication side effects.  Kept activity mimimal at first, but then began to feel better.  She has seen a great improvement overall in function.      PT  Treatment/Interventions  ADLs/Self Care Home Management;Cryotherapy;Electrical Stimulation;Moist Heat;Ultrasound;Gait training;Stair training;Functional mobility training;Therapeutic activities;Therapeutic exercise;Balance training;Neuromuscular re-education;Patient/family education;Manual techniques;Passive range of motion;Dry needling;Taping    PT Next Visit Plan  hip strength, sidelying , Nustep, renew.      PT Home Exercise Plan  hamstring stretch, SLR, supine marches, lower trunk rotations, cat/ cow exercise and chils pose, dead bug, bridge, side  clam green band       Patient will benefit from skilled therapeutic intervention in order to improve the following deficits and impairments:  Abnormal gait, Decreased activity tolerance, Decreased strength, Pain, Decreased mobility, Decreased range of motion, Postural dysfunction, Hypomobility  Visit Diagnosis: Chronic left-sided low back pain without sciatica  Muscle weakness (generalized)  Abnormal posture     Problem List Patient Active Problem List   Diagnosis Date Noted  . Vaginal candidiasis 02/23/2017  . Bacterial vaginosis 08/04/2016    Taetum Flewellen 07/05/2018, 10:42 AM  [facility] [address] [place], [place], [address] Phone: [phone]   Fax:  [phone]  Name: ITATI BROCKSMITH MRN: [id number] Date of Birth: [birth year]  Raeford Razor, PT 07/05/18 10:42 AM Phone: [phone] Fax: [phone]

## 2018-07-06 ENCOUNTER — Ambulatory Visit: Payer: Medicaid Other | Admitting: Physical Therapy

## 2018-07-06 ENCOUNTER — Encounter: Payer: Self-pay | Admitting: Physical Therapy

## 2018-07-06 DIAGNOSIS — G8929 Other chronic pain: Secondary | ICD-10-CM

## 2018-07-06 DIAGNOSIS — M545 Low back pain, unspecified: Secondary | ICD-10-CM

## 2018-07-06 DIAGNOSIS — R293 Abnormal posture: Secondary | ICD-10-CM

## 2018-07-06 DIAGNOSIS — M6281 Muscle weakness (generalized): Secondary | ICD-10-CM

## 2018-07-06 NOTE — Therapy (Signed)
[place], [place], [address] Phone: [phone]   Fax:  [phone]  Physical Therapy Treatment  Patient Details  Name: Tanya Austin MRN: [id number] Date of Birth: January 14, 1971 Referring Provider: Jairo Ben MD   Encounter Date: 07/06/2018  PT End of Session - 07/06/18 1011    Visit Number  8    Number of Visits  13    Date for PT Re-Evaluation  07/13/18    PT Start Time  0933    PT Stop Time  1012    PT Time Calculation (min)  39 min    Activity Tolerance  Patient tolerated treatment well    Behavior During Therapy  [facility] for tasks assessed/performed       Past Medical History:  Diagnosis Date  . Diabetes mellitus   . Fibroid   . Headache   . Herpes   . High cholesterol   . Hx of gonorrhea 2017    Past Surgical History:  Procedure Laterality Date  . CERVICAL BIOPSY      There were no vitals filed for this visit.  Subjective Assessment - 07/06/18 0935    Subjective  I am feeling good today, sometimes my back feels rough when I am doing some exercises.     Currently in Pain?  No/denies    Pain Score  0-No pain                       [facility] Adult PT Treatment/Exercise - 07/06/18 0001      Lumbar Exercises: Stretches   Active Hamstring Stretch  2 reps;30 seconds    Single Knee to Chest Stretch  5 reps;10 seconds    Lower Trunk Rotation  5 reps;10 seconds    Pelvic Tilt  10 reps;5 seconds    Figure 4 Stretch  --      Lumbar Exercises: Supine   Ab Set  20 reps;3 seconds    Bridge  10 reps 2 sets, staggered stance    Other Supine Lumbar Exercises  bent knee lower both legs at one time 1x10     Other Supine Lumbar Exercises  bent knee rotations x5 B with feet off table to tolerated range       Lumbar Exercises: Sidelying   Hip Abduction  Both;15 reps      Lumbar Exercises: Quadruped   Mad[place]/Old Horse  10 reps    Straight Leg Raise  10 reps prone, knee bent      Opposite Arm/Leg Raise  5 reps;Right arm/Left leg;Left arm/Right leg    Other Quadruped Lumbar Exercises  childs pose 3 ways x3 15 seconds each hold               PT Education - 07/06/18 1011    Education Details  execise form     Person(s) Educated  Patient    Methods  Explanation    Comprehension  Verbalized understanding       PT Short Term Goals - 07/05/18 1034      PT SHORT TERM GOAL #1   Title  pt will be indepedent with HEP    Status  Achieved      PT SHORT TERM GOAL #2   Title  Pt will verbalize and demonstrate improved posture with sitting, standing, & lifting mechanics to decrease and reduce reinjury of low back.    Status  Achieved      PT SHORT TERM  GOAL #3   Title  Pt to demonstrate decreased paraspinal tightness with </= 2/10 pain to promote trunk mobility and therapeutic progression    Status  Achieved        PT Long Term Goals - 07/05/18 1035      PT LONG TERM GOAL #1   Title  Pt will show indepedent with HEP to ensure progress in physical therapy    Status  On-going      PT LONG TERM GOAL #2   Title  Pt will increase L hip strength to >/= 4+ in order to improve functional mobility and decrease pain to </= 2/10.    Status  Unable to assess      PT LONG TERM GOAL #3   Title  Pt will report no pain with lumbar AROM (in all directions) to improve quality of life and functional mobility.     Status  Unable to assess      PT LONG TERM GOAL #4   Title  Pt will be able to stand for >/= 10 min with pain level </= 2/10 in order to be able to perform ADLs and basic housekeeping independently.    Status  Achieved            Plan - 07/06/18 1012    Clinical Impression Statement  Patient arrives today doing well, she reports some discomfort in her back with exercises however per her report feel this will likely improve with ongoing strength training. Continued to progress hip and core strength today as tolerated with patient being very easily  fatigued in core and hip musculature. Patient continues to do well and recently has had 0/10 pain throughout her day.     Rehab Potential  Good    PT Frequency  2x / week    PT Duration  4 weeks    PT Treatment/Interventions  ADLs/Self Care Home Management;Cryotherapy;Electrical Stimulation;Moist Heat;Ultrasound;Gait training;Stair training;Functional mobility training;Therapeutic activities;Therapeutic exercise;Balance training;Neuromuscular re-education;Patient/family education;Manual techniques;Passive range of motion;Dry needling;Taping    PT Next Visit Plan  hip strength, sidelying , Nustep, renew.      PT Home Exercise Plan  hamstring stretch, SLR, supine marches, lower trunk rotations, cat/ cow exercise and chils pose, dead bug, bridge, side  clam green band    Consulted and Agree with Plan of Care  Patient       Patient will benefit from skilled therapeutic intervention in order to improve the following deficits and impairments:  Abnormal gait, Decreased activity tolerance, Decreased strength, Pain, Decreased mobility, Decreased range of motion, Postural dysfunction, Hypomobility  Visit Diagnosis: Chronic left-sided low back pain without sciatica  Muscle weakness (generalized)  Abnormal posture     Problem List Patient Active Problem List   Diagnosis Date Noted  . Vaginal candidiasis 02/23/2017  . Bacterial vaginosis 08/04/2016    Deniece Ree PT, DPT, CBIS  Supplemental Physical Therapist [place]   Pager [place] [facility] [address] [place], [place], [address] Phone: [phone]   Fax:  [phone]  Name: Tanya Austin MRN: [id number] Date of Birth: 17-Sep-1971

## 2018-07-07 ENCOUNTER — Emergency Department (HOSPITAL_COMMUNITY)
Admission: EM | Admit: 2018-07-07 | Discharge: 2018-07-07 | Disposition: A | Discharge status: Home / Self Care | Payer: Medicaid Other | Attending: Emergency Medicine | Admitting: Emergency Medicine

## 2018-07-07 ENCOUNTER — Encounter (HOSPITAL_COMMUNITY): Payer: Self-pay | Admitting: Emergency Medicine

## 2018-07-07 DIAGNOSIS — W228XXA Striking against or struck by other objects, initial encounter: Secondary | ICD-10-CM | POA: Diagnosis not present

## 2018-07-07 DIAGNOSIS — E119 Type 2 diabetes mellitus without complications: Secondary | ICD-10-CM | POA: Insufficient documentation

## 2018-07-07 DIAGNOSIS — S0501XA Injury of conjunctiva and corneal abrasion without foreign body, right eye, initial encounter: Secondary | ICD-10-CM

## 2018-07-07 DIAGNOSIS — Y999 Unspecified external cause status: Secondary | ICD-10-CM | POA: Diagnosis not present

## 2018-07-07 DIAGNOSIS — Y929 Unspecified place or not applicable: Secondary | ICD-10-CM | POA: Insufficient documentation

## 2018-07-07 DIAGNOSIS — H5711 Ocular pain, right eye: Secondary | ICD-10-CM | POA: Diagnosis present

## 2018-07-07 DIAGNOSIS — Z79899 Other long term (current) drug therapy: Secondary | ICD-10-CM | POA: Insufficient documentation

## 2018-07-07 DIAGNOSIS — Z23 Encounter for immunization: Secondary | ICD-10-CM | POA: Diagnosis not present

## 2018-07-07 DIAGNOSIS — Z794 Long term (current) use of insulin: Secondary | ICD-10-CM | POA: Insufficient documentation

## 2018-07-07 DIAGNOSIS — Z87891 Personal history of nicotine dependence: Secondary | ICD-10-CM | POA: Diagnosis not present

## 2018-07-07 DIAGNOSIS — Y939 Activity, unspecified: Secondary | ICD-10-CM | POA: Insufficient documentation

## 2018-07-07 MED ORDER — ERYTHROMYCIN 5 MG/GM OP OINT: TOPICAL_OINTMENT | OPHTHALMIC | 0 refills | Status: DC

## 2018-07-07 MED ORDER — TETRACAINE HCL 0.5 % OP SOLN
2.0000 [drp] | Freq: Once | OPHTHALMIC | Status: AC
  Administered 2018-07-07: 2 [drp] via OPHTHALMIC
  Filled 2018-07-07: qty 4

## 2018-07-07 MED ORDER — TETANUS-DIPHTH-ACELL PERTUSSIS 5-2.5-18.5 LF-MCG/0.5 IM SUSP
0.5000 mL | Freq: Once | INTRAMUSCULAR | Status: AC
  Administered 2018-07-07: 0.5 mL via INTRAMUSCULAR
  Filled 2018-07-07: qty 0.5

## 2018-07-07 MED ORDER — FLUORESCEIN SODIUM 1 MG OP STRP
1.0000 | ORAL_STRIP | Freq: Once | OPHTHALMIC | Status: AC
  Administered 2018-07-07: 1 via OPHTHALMIC
  Filled 2018-07-07: qty 1

## 2018-07-07 NOTE — Discharge Instructions (Addendum)
You are seen in the emergency department today and found to have a corneal abrasion on exam, we are prescribing you and I antibiotic ointment to prevent infection in this area.  Please use the erythromycin ointment 4 times per day for the next 5 days.  Do not have significant improvement in your symptoms over the next 48 hours please see the ophthalmologist provided your discharge instructions or your eye doctor.  Otherwise she may follow-up with your eye doctor in 5 days.  Return to the emergency department for any new or worsening symptoms including but not limited to change in your vision, sensitivity to light, fever, swelling around the eye, trouble moving the eye, puslike drainage from the eye, or any other concerns that you may have.

## 2018-07-07 NOTE — ED Provider Notes (Signed)
[facility] EMERGENCY DEPARTMENT Provider Note   CSN: [id number] Arrival date & time: 07/07/18  1650     History   Chief Complaint Chief Complaint  Patient presents with  . Eye Pain    HPI Tanya Austin is a 47 y.o. female with a hx of gonorrhea, DM, headache, and hypercholesterolemia who presents to the emergency department with right eye symptoms for the past 3 days.  Patient states that she is wearing her glasses flew into her eye few days ago, she subsequently developed a foreign body sensation with irritation, discomfort, redness, pruritus, and watery drainage.  Current discomfort is a 5 out of 10 in severity.  No specific alleviating or aggravating factors.  Patient does wear glasses at baseline and has not had any change in her typical visual acuity, she states that she has baseline difficulty seeing out of the left eye which is unchanged and that her right eye vision is intact.  Patient denies contact lens use.  Denies change in vision, photophobia, nausea, vomiting, or congestion.  Patient does note that she has a history of a retinal detachment and this does not feel remotely similar.  HPI  Past Medical History:  Diagnosis Date  . Diabetes mellitus   . Fibroid   . Headache   . Herpes   . High cholesterol   . Hx of gonorrhea 2017    Patient Active Problem List   Diagnosis Date Noted  . Vaginal candidiasis 02/23/2017  . Bacterial vaginosis 08/04/2016    Past Surgical History:  Procedure Laterality Date  . CERVICAL BIOPSY       OB History    Gravida  4   Para  4   Term  4   Preterm  0   AB  0   Living  4     SAB  0   TAB  0   Ectopic  0   Multiple  0   Live Births  4            Home Medications    Prior to Admission medications   Medication Sig Start Date End Date Taking? Authorizing Provider  acetaminophen (TYLENOL) 500 MG tablet Take 1,000 mg by mouth 2 (two) times daily as needed for moderate pain.    [provider]  ALPRAZolam Duanne Moron) 0.5 MG tablet Take 0.5 mg by mouth 2 (two) times daily as needed for anxiety.    [provider]  Aspirin-Salicylamide-Caffeine (BC HEADACHE POWDER PO) Take 1 packet by mouth daily as needed (For headache.).    [provider]  Cholecalciferol (VITAMIN D3) 3000 units TABS Take 1 capsule by mouth daily.     [provider]  fish oil-omega-3 fatty acids 1000 MG capsule Take 1 g by mouth daily.    [provider]  Insulin Glargine (TOUJEO SOLOSTAR) 300 UNIT/ML SOPN Inject 14 Units into the skin at bedtime.    [provider]  IRON PO Take 1 tablet by mouth daily. otc medication- not sure of dose    [provider]  lidocaine (XYLOCAINE) 2 % jelly Apply 1 application topically as needed. 05/18/17   Emily Filbert, MD  methocarbamol (ROBAXIN) 500 MG tablet Take 1-2 tablets (500-1,000 mg total) by mouth every 6 (six) hours as needed for muscle spasms. 10/24/17   Gareth Morgan, MD  metroNIDAZOLE (FLAGYL) 500 MG tablet Take 1 tablet (500 mg total) by mouth 2 (two) times daily. 01/29/18   Jorje Guild,  NP  Norgestimate-Ethinyl Estradiol Triphasic (TRINESSA, 28,) 0.18/0.215/0.25 MG-35 MCG tablet Take 1 tablet by mouth daily.    [provider]  ondansetron (ZOFRAN) 4 MG tablet Take 1 tablet (4 mg total) by mouth every 8 (eight) hours as needed for nausea or vomiting. 10/21/17   Rolland Porter, MD  pioglitazone (ACTOS) 30 MG tablet Take 30 mg by mouth daily. 02/21/16   [provider]  Probiotic Product (PROBIOTIC DAILY PO) Take 1 capsule by mouth daily.    [provider]  promethazine (PHENERGAN) 25 MG tablet Take 1 tablet (25 mg total) by mouth every 6 (six) hours as needed for nausea or vomiting. 10/24/17   Gareth Morgan, MD  simvastatin (ZOCOR) 20 MG tablet Take 20 mg by mouth at bedtime.     [provider]  sitaGLIPtin-metformin (JANUMET) 50-1000 MG per tablet Take 1 tablet by mouth  2 (two) times daily with a meal.    [provider]  valACYclovir (VALTREX) 1000 MG tablet Take 1 tablet (1,000 mg total) by mouth daily. 08/30/17   Emily Filbert, MD    Family History Family History  Problem Relation Age of Onset  . Hypertension Other   . Hyperlipidemia Other   . Heart disease Other   . Hypertension Mother   . Diabetes Mother   . Hypertension Father     Social History Social History   Tobacco Use  . Smoking status: Former Smoker    Types: Cigarettes    Last attempt to quit: 06/09/2000    Years since quitting: 18.0  . Smokeless tobacco: Never Used  Substance Use Topics  . Alcohol use: No    Comment: Socially on weekends  . Drug use: No    Types: Marijuana     Allergies   Patient has no known allergies.   Review of Systems Review of Systems  Constitutional: Negative for chills and fever.  HENT: Negative for congestion.   Eyes: Positive for pain, discharge, redness and itching. Negative for photophobia and visual disturbance.     Physical Exam Updated Vital Signs BP 116/79 (BP Location: Right Arm)   Pulse 73   Temp 98 F (36.7 C) (Oral)   Resp 18   SpO2 100%   Physical Exam  Constitutional: She appears well-developed and well-nourished.  Non-toxic appearance. No distress.  HENT:  Head: Normocephalic and atraumatic.  Right Ear: Tympanic membrane normal.  Left Ear: Tympanic membrane normal.  Nose: Nose normal.  Mouth/Throat: Uvula is midline.  Eyes: Pupils are equal, round, and reactive to light. EOM and lids are normal. Lids are everted and swept, no foreign bodies found. Right eye exhibits no discharge. Left eye exhibits no discharge. Right conjunctiva is injected. Right conjunctiva has no hemorrhage. Left conjunctiva is not injected. Left conjunctiva has no hemorrhage.  EOMI. PERRL. No periorbital erythema/edema. No proptosis.  Woods Lamp Exam R eye: Small amount of Fluorescein uptake in the R eye at the 6:00 position, approximately  3 mm in size consistent with corneal abrasion. No dendritic staining. No appreciable corneal ulcer, hyphema, or hypopyon.  R eye IOP: 12 mmhg   Neurological: She is alert.  Clear speech.   Psychiatric: She has a normal mood and affect. Her behavior is normal. Thought content normal.  Nursing note and vitals reviewed.    ED Treatments / Results  Labs (all labs ordered are listed, but only abnormal results are displayed) Labs Reviewed - No data to display  EKG None  Radiology No results found.  Procedures Procedures (including critical care time)  Medications Ordered in ED Medications  tetracaine (PONTOCAINE) 0.5 % ophthalmic solution 2 drop (has no administration in time range)  fluorescein ophthalmic strip 1 strip (has no administration in time range)     Initial Impression / Assessment and Plan / ED Course  I have reviewed the triage vital signs and the nursing notes.  Pertinent labs & imaging results that were available during my care of the patient were reviewed by me and considered in my medical decision making (see chart for details).     Patient presents the emergency department today with right foreign body sensation with associated irritation, erythema, pruritus, and watery drainage.  Physical exam consistent with corneal abrasion. No indication of corneal ulceration or HSV. Patient is afebrile and without proptosis, entrapment, or consensual photophobia, no periorbital swelling- doubt periorbital or orbital cellulitis. IOP WNL, doubt acute glaucoma. No evidence of FB. No significant visual acuity deficit in affected eye, L eye with baseline visual acuity issue per patient unchanged. Tetanus updated. Patient is not a contact lens wearer will discharge home with erythromycin ointment and ophthalmology follow up. I discussed results, treatment plan, need for follow-up, and return precautions with the patient. Provided opportunity for questions, patient confirmed  understanding and is in agreement with plan.   Final Clinical Impressions(s) / ED Diagnoses   Final diagnoses:  Abrasion of right cornea, initial encounter    ED Discharge Orders         Ordered    erythromycin ophthalmic ointment     07/07/18 [address] PA-C 07/07/18 2006    Fredia Sorrow, MD 07/09/18 [phone]

## 2018-07-07 NOTE — ED Triage Notes (Signed)
Pt presents to ED for assessment of right eye pqin x 2 days, redness, tearing, sensitivity all noted.

## 2018-07-07 NOTE — ED Notes (Signed)
Patient able to ambulate independently  

## 2018-07-08 ENCOUNTER — Encounter

## 2018-07-11 ENCOUNTER — Encounter: Payer: Self-pay | Admitting: Physical Therapy

## 2018-07-11 ENCOUNTER — Ambulatory Visit: Payer: Medicaid Other | Admitting: Physical Therapy

## 2018-07-11 DIAGNOSIS — R293 Abnormal posture: Secondary | ICD-10-CM

## 2018-07-11 DIAGNOSIS — G8929 Other chronic pain: Secondary | ICD-10-CM

## 2018-07-11 DIAGNOSIS — M545 Low back pain, unspecified: Secondary | ICD-10-CM

## 2018-07-11 DIAGNOSIS — M6281 Muscle weakness (generalized): Secondary | ICD-10-CM

## 2018-07-11 NOTE — Therapy (Addendum)
[place], [place], [address] Phone: [phone]   Fax:  [phone]  Physical Therapy Treatment/Discharge Summary  Patient Details  Name: Tanya Austin MRN: [id number] Date of Birth: 11/14/1971 Referring Provider: Jairo Ben MD   Encounter Date: 07/11/2018  PT End of Session - 07/11/18 1045    Visit Number  9    Number of Visits  13    Date for PT Re-Evaluation  07/13/18    PT Start Time  1018    PT Stop Time  1100    PT Time Calculation (min)  42 min    Activity Tolerance  Patient tolerated treatment well    Behavior During Therapy  [facility] for tasks assessed/performed       Past Medical History:  Diagnosis Date  . Diabetes mellitus   . Fibroid   . Headache   . Herpes   . High cholesterol   . Hx of gonorrhea 2017    Past Surgical History:  Procedure Laterality Date  . CERVICAL BIOPSY      There were no vitals filed for this visit.  Subjective Assessment - 07/11/18 1020    Subjective  "I am feeling good today. I have been doing my exercises at home."    Currently in Pain?  Yes    Pain Score  2     Pain Location  Back    Pain Orientation  Right    Pain Descriptors / Indicators  Dull;Aching    Pain Type  Chronic pain    Pain Onset  Other (comment)    Pain Frequency  Intermittent    Aggravating Factors   Walking    Pain Relieving Factors  exercise         [facility] PT Assessment - 07/11/18 0001      AROM   Lumbar Flexion  90    Lumbar Extension  28    Lumbar - Right Side Bend  20   painful   Lumbar - Left Side Bend  20   painful     Strength   Right Hip Flexion  4+/5    Right Hip Extension  4/5    Right Hip ABduction  4+/5    Left Hip Flexion  4+/5    Left Hip Extension  4/5    Left Hip ABduction  4+/5                   [facility] Adult PT Treatment/Exercise - 07/11/18 0001      Lumbar Exercises: Stretches   Passive Hamstring Stretch  2 reps;30  seconds;Right;Left    Lower Trunk Rotation  5 reps;10 seconds      Lumbar Exercises: Aerobic   Nustep  L5 x 5 min UE and LE      Lumbar Exercises: Seated   Sit to Stand  10 reps   x 2 sets; no UE support     Lumbar Exercises: Supine   Other Supine Lumbar Exercises  bent knee lower both legs at one time 1x10       Knee/Hip Exercises: Standing   Hip Abduction  Both;2 sets;10 reps;Knee straight   red theraband   Hip Extension  Both;2 sets;10 reps   red theraband              PT Short Term Goals - 07/05/18 1034      PT SHORT TERM GOAL #1   Title  pt will be indepedent with  HEP    Status  Achieved      PT SHORT TERM GOAL #2   Title  Pt will verbalize and demonstrate improved posture with sitting, standing, & lifting mechanics to decrease and reduce reinjury of low back.    Status  Achieved      PT SHORT TERM GOAL #3   Title  Pt to demonstrate decreased paraspinal tightness with </= 2/10 pain to promote trunk mobility and therapeutic progression    Status  Achieved        PT Long Term Goals - 07/11/18 1034      PT LONG TERM GOAL #1   Title  Pt will show indepedent with HEP to ensure progress in physical therapy    Baseline  Pt performing exercises at home    Status  Achieved      PT LONG TERM GOAL #2   Title  Pt will increase L hip strength to >/= 4+ in order to improve functional mobility and decrease pain to </= 2/10.    Baseline  hip flex 4+, ext 4, abd 4+ bilaterally    Status  Partially Met      PT LONG TERM GOAL #3   Title  Pt will report no pain with lumbar AROM (in all directions) to improve quality of life and functional mobility.     Baseline  still some pain/tightness with side bending    Status  Partially Met      PT LONG TERM GOAL #4   Title  Pt will be able to stand for >/= 10 min with pain level </= 2/10 in order to be able to perform ADLs and basic housekeeping independently.    Baseline  Pt states she can stand and make an entire meal  without pain    Status  Achieved            Plan - 07/11/18 1058    Clinical Impression Statement  Pt arrives today with minimal back pain. Treatment today focused on core and hip strengthening which pt tolerated well. Reassessment perfomed; pt demonstrates lumbar AROM and hip strength that is [facility]. She has achieved or partially met all of her LTGs. She is independent with her HEP and feels that she has a good grasp on what to do at home if she has pain. Based on reassessment, pt is independent with HEP and is able to progress at home from this point. Pt discharged at this time.     PT Treatment/Interventions  ADLs/Self Care Home Management;Cryotherapy;Electrical Stimulation;Moist Heat;Ultrasound;Gait training;Stair training;Functional mobility training;Therapeutic activities;Therapeutic exercise;Balance training;Neuromuscular re-education;Patient/family education;Manual techniques;Passive range of motion;Dry needling;Taping    PT Next Visit Plan  pt discharged today    PT Home Exercise Plan  hamstring stretch, SLR, supine marches, lower trunk rotations, cat/ cow exercise and chils pose, dead bug, bridge, side  clam green band    Consulted and Agree with Plan of Care  Patient       Patient will benefit from skilled therapeutic intervention in order to improve the following deficits and impairments:  Abnormal gait, Decreased activity tolerance, Decreased strength, Pain, Decreased mobility, Decreased range of motion, Postural dysfunction, Hypomobility  Visit Diagnosis: Chronic left-sided low back pain without sciatica  Muscle weakness (generalized)  Abnormal posture     Problem List Patient Active Problem List   Diagnosis Date Noted  . Vaginal candidiasis 02/23/2017  . Bacterial vaginosis 08/04/2016   Worthy Flank, SPT 07/11/18 12:45 PM   [facility]  [place], [place], [address] Phone: [phone]   Fax:   [phone]  Name: Tanya Austin MRN: [id number] Date of Birth: 04/28/1971       PHYSICAL THERAPY DISCHARGE SUMMARY  Visits from Start of Care: 9  Current functional level related to goals / functional outcomes: See above   Remaining deficits: Slight strength deficits   Education / Equipment: HEP Plan: Patient agrees to discharge.  Patient goals were partially met. Patient is being discharged due to being pleased with the current functional level.  ?????

## 2018-07-13 ENCOUNTER — Encounter: Payer: Self-pay | Admitting: Physical Therapy

## 2018-09-06 ENCOUNTER — Telehealth: Payer: Self-pay | Admitting: General Practice

## 2018-09-06 NOTE — Telephone Encounter (Signed)
Patient called & left message on nurse voicemail line stating she is calling about her medication. Called patient and she states she takes valtrex daily & needs refills sent in. Told patient I would inform Dr Hulan Fray she needs a refill. Patient verbalized understanding & had no questions.

## 2018-09-08 ENCOUNTER — Other Ambulatory Visit: Payer: Self-pay | Admitting: Obstetrics & Gynecology

## 2018-09-08 MED ORDER — VALACYCLOVIR HCL 1 G PO TABS: 1000.0000 mg | ORAL_TABLET | Freq: Every day | ORAL | 12 refills | Status: DC

## 2018-09-08 NOTE — Progress Notes (Unsigned)
valtex refilled

## 2018-09-27 ENCOUNTER — Inpatient Hospital Stay (HOSPITAL_COMMUNITY)
Admission: AD | Admit: 2018-09-27 | Discharge: 2018-09-27 | Disposition: A | Discharge status: Home / Self Care | Payer: Medicaid Other | Source: Ambulatory Visit | Attending: Obstetrics & Gynecology | Admitting: Obstetrics & Gynecology

## 2018-09-27 DIAGNOSIS — Z8249 Family history of ischemic heart disease and other diseases of the circulatory system: Secondary | ICD-10-CM | POA: Insufficient documentation

## 2018-09-27 DIAGNOSIS — Z8619 Personal history of other infectious and parasitic diseases: Secondary | ICD-10-CM | POA: Diagnosis not present

## 2018-09-27 DIAGNOSIS — Z79899 Other long term (current) drug therapy: Secondary | ICD-10-CM | POA: Diagnosis not present

## 2018-09-27 DIAGNOSIS — Z87891 Personal history of nicotine dependence: Secondary | ICD-10-CM | POA: Insufficient documentation

## 2018-09-27 DIAGNOSIS — E7800 Pure hypercholesterolemia, unspecified: Secondary | ICD-10-CM | POA: Insufficient documentation

## 2018-09-27 DIAGNOSIS — E119 Type 2 diabetes mellitus without complications: Secondary | ICD-10-CM | POA: Diagnosis not present

## 2018-09-27 DIAGNOSIS — N760 Acute vaginitis: Secondary | ICD-10-CM | POA: Diagnosis not present

## 2018-09-27 DIAGNOSIS — Z794 Long term (current) use of insulin: Secondary | ICD-10-CM | POA: Insufficient documentation

## 2018-09-27 DIAGNOSIS — Z833 Family history of diabetes mellitus: Secondary | ICD-10-CM | POA: Diagnosis not present

## 2018-09-27 LAB — WET PREP, GENITAL
CLUE CELLS WET PREP: NONE SEEN
SPERM: NONE SEEN
Trich, Wet Prep: NONE SEEN
YEAST WET PREP: NONE SEEN

## 2018-09-27 MED ORDER — FLUCONAZOLE 150 MG PO TABS: 150.0000 mg | ORAL_TABLET | Freq: Every day | ORAL | 0 refills | Status: DC

## 2018-09-27 NOTE — MAU Provider Note (Signed)
History     CSN: [id number]  Arrival date and time: 09/27/18 1012   First Provider Initiated Contact with Patient 09/27/18 1203      Chief Complaint  Patient presents with  . Vaginitis   HPI   [name] is 47 y.o. female here in vaginal itching X 2 weeks. Says she has not tried anything over the counter. No bleeding, no pain, no odor. No other complaints.   OB History    Gravida  4   Para  4   Term  4   Preterm  0   AB  0   Living  4     SAB  0   TAB  0   Ectopic  0   Multiple  0   Live Births  4           Past Medical History:  Diagnosis Date  . Diabetes mellitus   . Fibroid   . Headache   . Herpes   . High cholesterol   . Hx of gonorrhea 2017    Past Surgical History:  Procedure Laterality Date  . CERVICAL BIOPSY      Family History  Problem Relation Age of Onset  . Hypertension Other   . Hyperlipidemia Other   . Heart disease Other   . Hypertension Mother   . Diabetes Mother   . Hypertension Father     Social History   Tobacco Use  . Smoking status: Former Smoker    Types: Cigarettes    Last attempt to quit: 06/09/2000    Years since quitting: 18.3  . Smokeless tobacco: Never Used  Substance Use Topics  . Alcohol use: No    Comment: Socially on weekends  . Drug use: No    Types: Marijuana    Allergies: No Known Allergies  Medications Prior to Admission  Medication Sig Dispense Refill Last Dose  . acetaminophen (TYLENOL) 500 MG tablet Take 1,000 mg by mouth 2 (two) times daily as needed for moderate pain.   Taking  . ALPRAZolam (XANAX) 0.5 MG tablet Take 0.5 mg by mouth 2 (two) times daily as needed for anxiety.   Taking  . Aspirin-Salicylamide-Caffeine (BC HEADACHE POWDER PO) Take 1 packet by mouth daily as needed (For headache.).   Taking  . Cholecalciferol (VITAMIN D3) 3000 units TABS Take 1 capsule by mouth daily.    Taking  . erythromycin ophthalmic ointment Place a 1/2 inch ribbon of ointment into the  lower eyelid 4 times per day for the next 5 days. 1 g 0   . fish oil-omega-3 fatty acids 1000 MG capsule Take 1 g by mouth daily.   Taking  . Insulin Glargine (TOUJEO SOLOSTAR) 300 UNIT/ML SOPN Inject 14 Units into the skin at bedtime.   Taking  . IRON PO Take 1 tablet by mouth daily. otc medication- not sure of dose   Taking  . lidocaine (XYLOCAINE) 2 % jelly Apply 1 application topically as needed. 30 mL 2 Taking  . methocarbamol (ROBAXIN) 500 MG tablet Take 1-2 tablets (500-1,000 mg total) by mouth every 6 (six) hours as needed for muscle spasms. 20 tablet 0 Taking  . metroNIDAZOLE (FLAGYL) 500 MG tablet Take 1 tablet (500 mg total) by mouth 2 (two) times daily. 14 tablet 0 Taking  . Norgestimate-Ethinyl Estradiol Triphasic (TRINESSA, 28,) 0.18/0.215/0.25 MG-35 MCG tablet Take 1 tablet by mouth daily.   Taking  . ondansetron (ZOFRAN) 4 MG tablet Take 1 tablet (4 mg total) by  mouth every 8 (eight) hours as needed for nausea or vomiting. 6 tablet 0 Taking  . pioglitazone (ACTOS) 30 MG tablet Take 30 mg by mouth daily.  3 Taking  . Probiotic Product (PROBIOTIC DAILY PO) Take 1 capsule by mouth daily.   Taking  . promethazine (PHENERGAN) 25 MG tablet Take 1 tablet (25 mg total) by mouth every 6 (six) hours as needed for nausea or vomiting. 30 tablet 0 Taking  . simvastatin (ZOCOR) 20 MG tablet Take 20 mg by mouth at bedtime.    Taking  . sitaGLIPtin-metformin (JANUMET) 50-1000 MG per tablet Take 1 tablet by mouth 2 (two) times daily with a meal.   Taking  . valACYclovir (VALTREX) 1000 MG tablet Take 1 tablet (1,000 mg total) by mouth daily. 30 tablet 12    Results for orders placed or performed during the hospital encounter of 09/27/18 (from the past 48 hour(s))  GC/Chlamydia probe amp (Alex)not at [facility]     Status: None   Collection Time: 09/27/18 12:00 AM  Result Value Ref Range   Chlamydia Negative     Comment: Normal Reference Range - Negative   Neisseria gonorrhea Negative      Comment: Normal Reference Range - Negative  Wet prep, genital     Status: Abnormal   Collection Time: 09/27/18 10:39 AM  Result Value Ref Range   Yeast Wet Prep HPF POC NONE SEEN NONE SEEN    Comment: Specimen diluted due to transport tube containing more than 1 ml of saline, interpret results with caution.   Trich, Wet Prep NONE SEEN NONE SEEN   Clue Cells Wet Prep HPF POC NONE SEEN NONE SEEN   WBC, Wet Prep HPF POC FEW (A) NONE SEEN    Comment: MANY BACTERIA SEEN   Sperm NONE SEEN     Comment: Performed at [facility], [address] [place], [place] [address]    Review of Systems  Gastrointestinal: Negative for abdominal pain.  Genitourinary: Positive for vaginal discharge. Negative for dysuria and vaginal pain.   Physical Exam   Blood pressure 120/67, pulse 76, temperature 98 F (36.7 C), resp. rate 18, height 5' 7.5" (1.715 m), weight 102.1 kg, last menstrual period 08/22/2018.  Physical Exam  Constitutional: She is oriented to person, place, and time. She appears well-developed and well-nourished. No distress.  HENT:  Head: Normocephalic.  Eyes: Pupils are equal, round, and reactive to light.  Genitourinary:  Genitourinary Comments: Wet prep and GC collected without speculum.   Neurological: She is alert and oriented to person, place, and time.  Skin: Skin is warm. She is not diaphoretic.  Psychiatric: Her behavior is normal.   MAU Course  Procedures. None  MDM  Wet prep and GC   Assessment and Plan   1. Acute vaginitis   2.      Presumed yeast    P:  Discharge home in stable condition Rx: Diflucan, if symptoms worsen you need to see GYN Return to MAU for emergencies only  Nicholl Onstott, Artist Pais, NP 09/28/2018 6:33 PM

## 2018-09-27 NOTE — Discharge Instructions (Signed)

## 2018-09-27 NOTE — MAU Note (Signed)
Pt reports she thinks she has a yeast infection. Vaginal itchiness no discharge x 2 weeks. Has not tried any OTC treatments.

## 2018-09-28 LAB — GC/CHLAMYDIA PROBE AMP (~~LOC~~) NOT AT ARMC
Chlamydia: NEGATIVE
Neisseria Gonorrhea: NEGATIVE

## 2018-10-20 ENCOUNTER — Ambulatory Visit (INDEPENDENT_AMBULATORY_CARE_PROVIDER_SITE_OTHER): Payer: Medicaid Other | Admitting: *Deleted

## 2018-10-20 ENCOUNTER — Other Ambulatory Visit (HOSPITAL_COMMUNITY)
Admission: RE | Admit: 2018-10-20 | Discharge: 2018-10-20 | Disposition: A | Discharge status: Home / Self Care | Payer: Medicaid Other | Source: Ambulatory Visit | Attending: Obstetrics & Gynecology | Admitting: Obstetrics & Gynecology

## 2018-10-20 DIAGNOSIS — N898 Other specified noninflammatory disorders of vagina: Secondary | ICD-10-CM

## 2018-10-20 NOTE — Progress Notes (Signed)
Patient seen and assessed by nursing staff.  Agree with documentation and plan.  

## 2018-10-20 NOTE — Progress Notes (Signed)
C/o vaginal itching and smell ' is off" - wants to do self swab for yeast and bv. Had negative  GC one month ago and denies any possible exposure to std's or intimate contact. Informed her will be notified if positive and encouraged to use mychart for results. States can't get in [place]- gave phone number to call for assistance with Mychart.  Vaughan Basta, RN

## 2018-10-21 LAB — CERVICOVAGINAL ANCILLARY ONLY
Bacterial vaginitis: NEGATIVE
Candida vaginitis: NEGATIVE

## 2019-03-11 IMAGING — DX DG FOOT COMPLETE 3+V*L*
3 series · 3 of 3 positions shown · non-contrast
Comparison: None.

CLINICAL DATA: Left ankle and foot injury stepping on to recurrent
1 week ago. Pain. Initial encounter.

EXAM:
LEFT FOOT - COMPLETE 3+ VIEW

[foot ap]
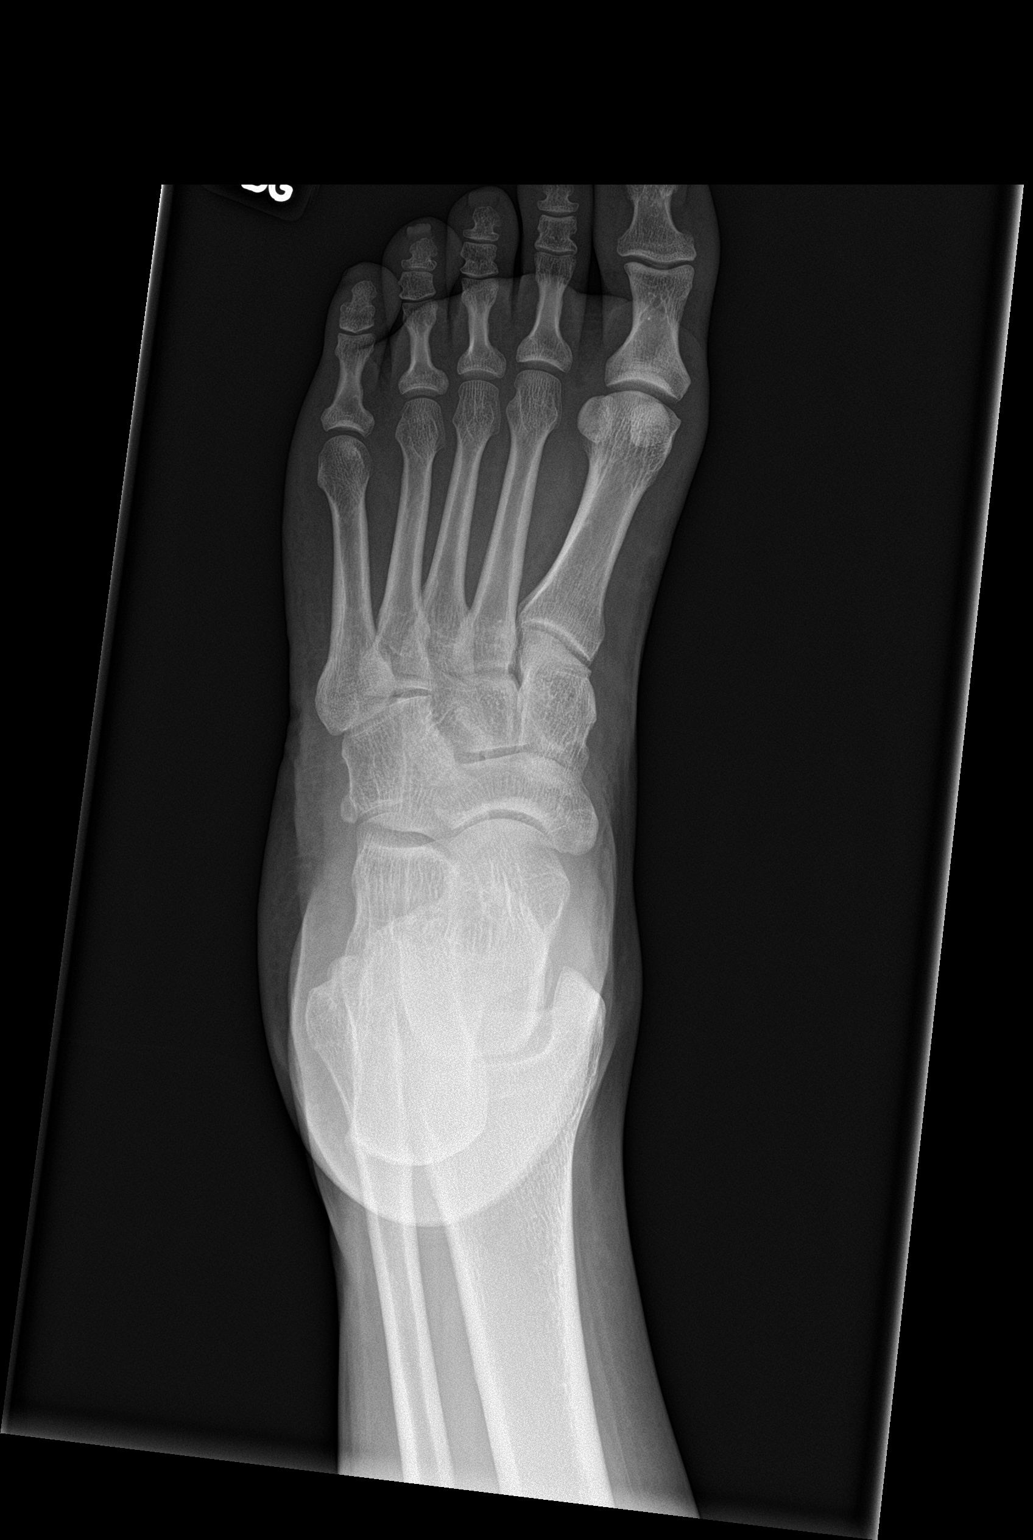

[foot obl]
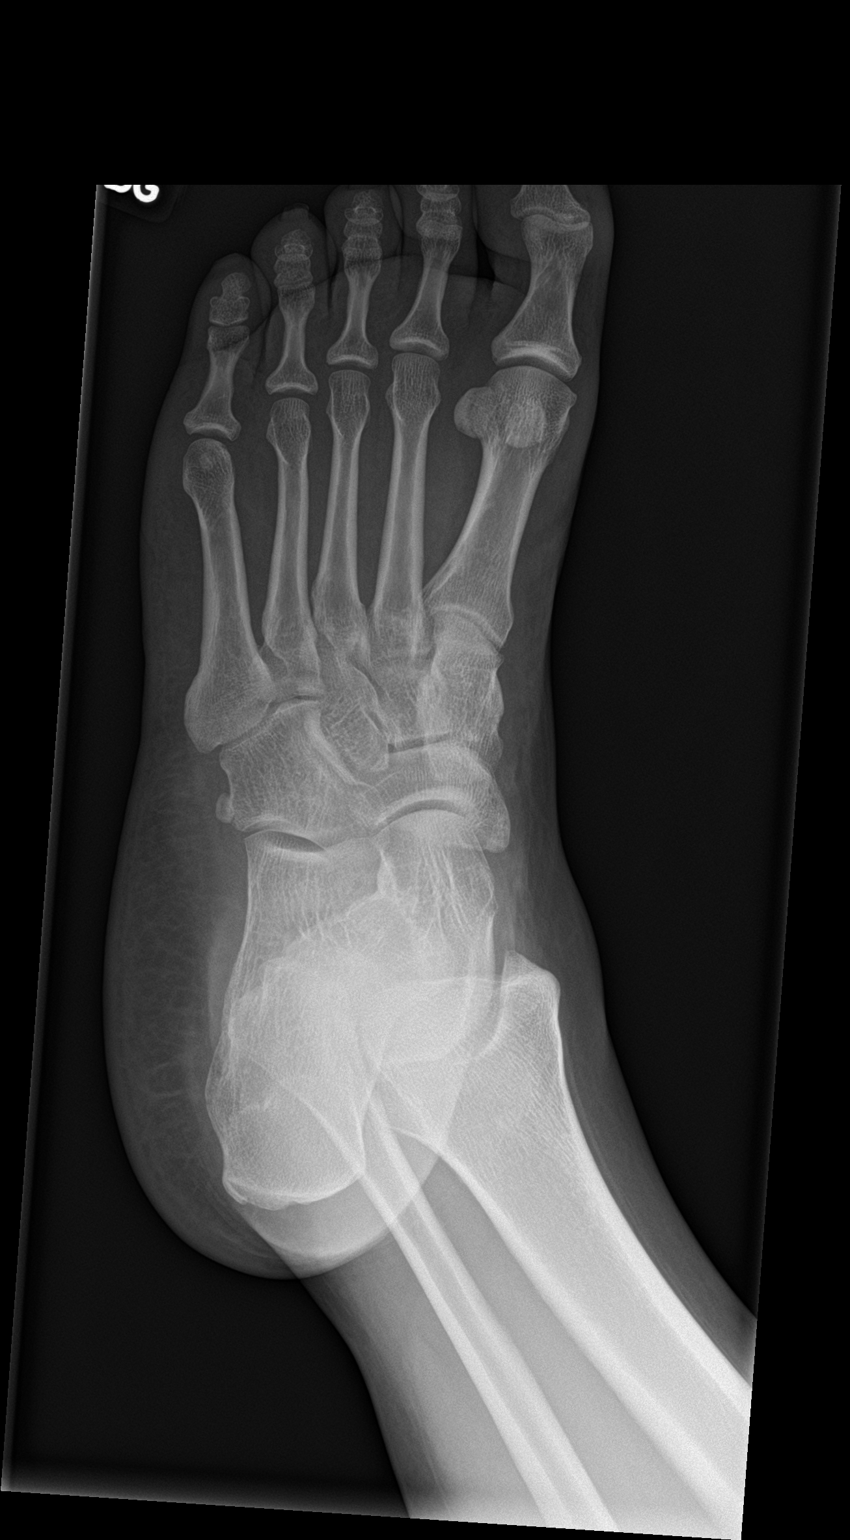

[foot lat]
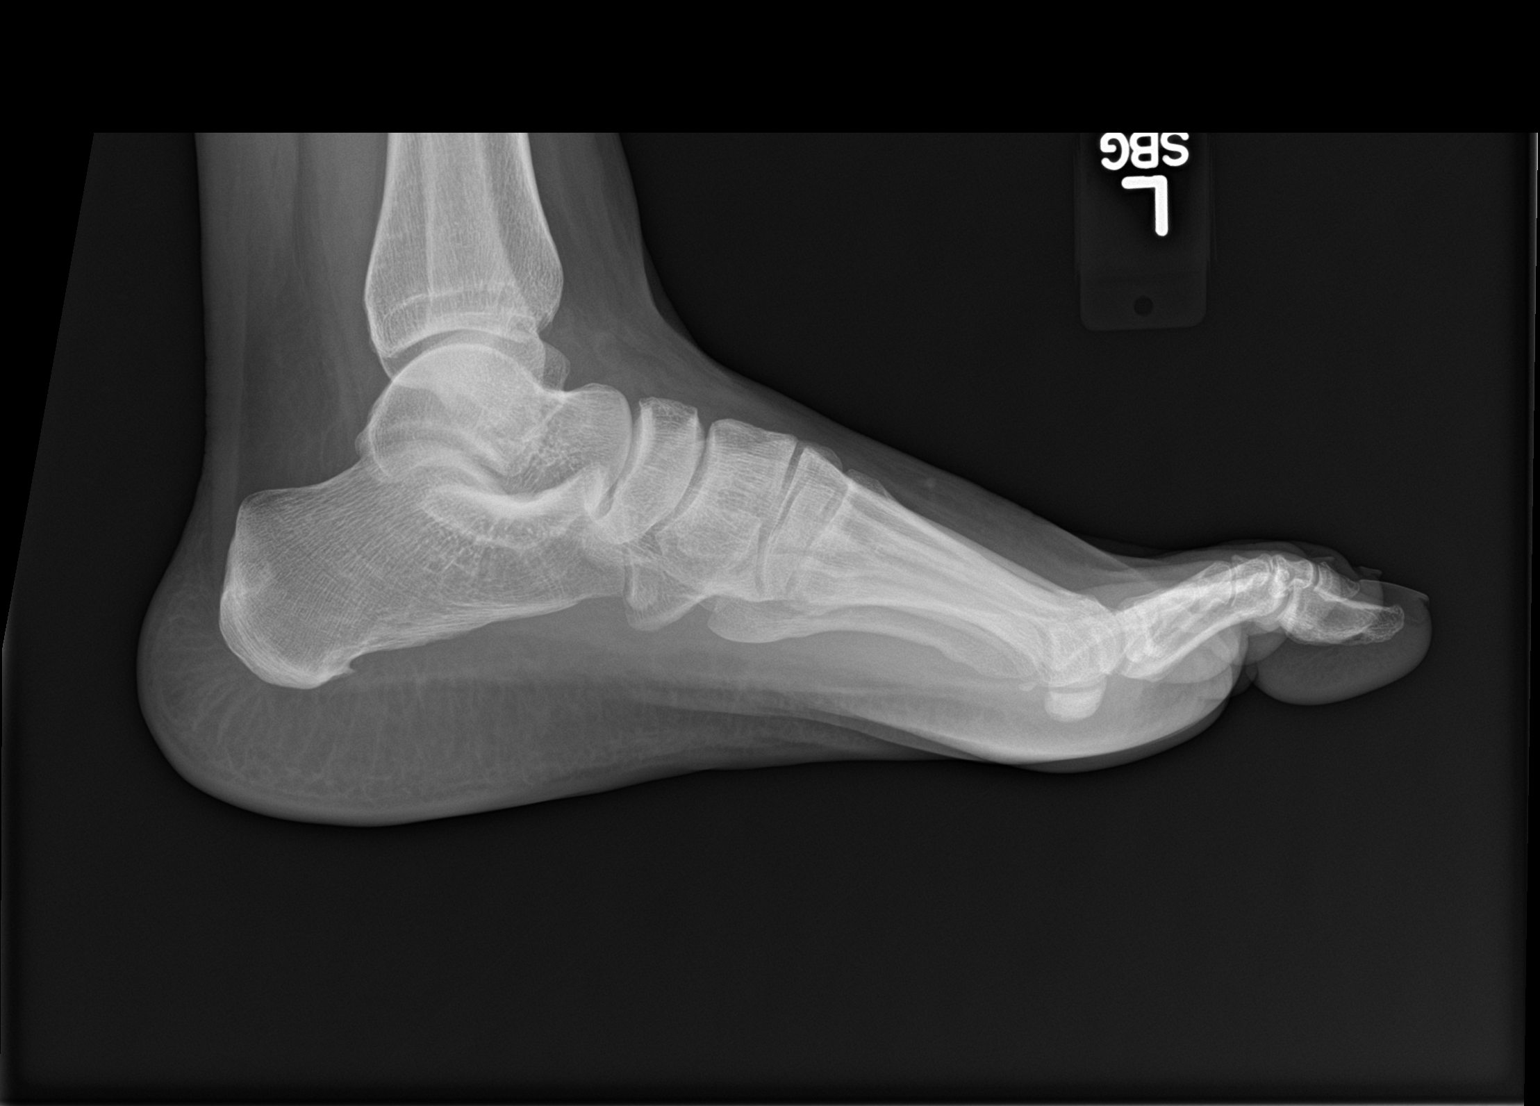

[3 of 3 positions shown; findings below may reference images not displayed]

FINDINGS: There is no evidence of fracture or dislocation. There is no
evidence of arthropathy or other focal bone abnormality. Soft
tissues are unremarkable.
IMPRESSION: Negative exam.

## 2019-07-19 ENCOUNTER — Telehealth: Payer: Self-pay | Admitting: *Deleted

## 2019-07-19 DIAGNOSIS — N898 Other specified noninflammatory disorders of vagina: Secondary | ICD-10-CM

## 2019-07-19 MED ORDER — FLUCONAZOLE 150 MG PO TABS
150.0000 mg | ORAL_TABLET | Freq: Once | ORAL | 0 refills | Status: AC
Start: 2019-07-19 — End: 2019-07-19

## 2019-07-19 NOTE — Telephone Encounter (Signed)
Received a transfer call from registar. Tanya Austin c/o vaginal itching and doesn't want to come if for wet prep/ nurse visit because she has her period. I asked what symtoms she was having-and she c/o vaginal itching around her vagina and vaginal discharge. I asked if it felt like when she was dx with yeast in October 2019 and she is says she isn't sure. I explained I can send in Rx for diflucan x1 and if that doesn't relieve her symptoms she can call us back for appointment. She also asked when her pap is due. I informed her it is due after 08/05/19 and I can have registrar schedule and notify her . She voices understanding. Linda,RN

## 2019-08-24 ENCOUNTER — Ambulatory Visit: Payer: Medicaid Other | Admitting: Obstetrics & Gynecology

## 2019-09-18 ENCOUNTER — Other Ambulatory Visit: Payer: Self-pay

## 2019-09-18 DIAGNOSIS — Z20822 Contact with and (suspected) exposure to covid-19: Secondary | ICD-10-CM

## 2019-09-20 LAB — NOVEL CORONAVIRUS, NAA: SARS-CoV-2, NAA: NOT DETECTED

## 2019-10-05 ENCOUNTER — Ambulatory Visit: Payer: Medicaid Other | Admitting: Obstetrics & Gynecology

## 2019-10-06 ENCOUNTER — Ambulatory Visit (INDEPENDENT_AMBULATORY_CARE_PROVIDER_SITE_OTHER): Payer: Medicaid Other | Admitting: Obstetrics & Gynecology

## 2019-10-06 ENCOUNTER — Other Ambulatory Visit (HOSPITAL_COMMUNITY)
Admission: RE | Admit: 2019-10-06 | Discharge: 2019-10-06 | Disposition: A | Discharge status: Home / Self Care | Payer: Medicaid Other | Source: Ambulatory Visit | Attending: Obstetrics & Gynecology | Admitting: Obstetrics & Gynecology

## 2019-10-06 ENCOUNTER — Other Ambulatory Visit: Payer: Self-pay

## 2019-10-06 ENCOUNTER — Encounter: Payer: Self-pay | Admitting: Obstetrics & Gynecology

## 2019-10-06 ENCOUNTER — Ambulatory Visit: Payer: Medicaid Other | Admitting: Obstetrics & Gynecology

## 2019-10-06 VITALS — BP 128/63 | HR 66 | Wt 221.2 lb

## 2019-10-06 DIAGNOSIS — Z0000 Encounter for general adult medical examination without abnormal findings: Secondary | ICD-10-CM | POA: Diagnosis not present

## 2019-10-06 DIAGNOSIS — Z01419 Encounter for gynecological examination (general) (routine) without abnormal findings: Secondary | ICD-10-CM

## 2019-10-06 NOTE — Progress Notes (Signed)
Subjective:    Tanya Austin is a single P4 (67, 41, 52, and 47 yo kids)  who presents for an annual exam. The patient has no complaints today. She stopped having sex and now the BV has completely resolved. She tried boric acid and that didn't help. The patient is not currently sexually active. GYN screening history: last pap: was normal. The patient wears seatbelts: yes. The patient participates in regular exercise: no. Has the patient ever been transfused or tattooed?: yes. The patient reports that there is not domestic violence in her life.   Menstrual History: OB History    Gravida  4   Para  4   Term  4   Preterm  0   AB  0   Living  4     SAB  0   TAB  0   Ectopic  0   Multiple  0   Live Births  64           Menarche age: 3 Patient's last menstrual period was 09/27/2019 (exact date).    The following portions of the patient's history were reviewed and updated as appropriate: allergies, current medications, past family history, past medical history, past social history, past surgical history and problem list.  Review of Systems Pertinent items are noted in HPI.   Fh- no breast/gyn/colon cancer Ros- working at [employer] She has lost 10 pounds, working on her diet   Objective:    BP 128/63   Pulse 66   Wt 221 lb 3.2 oz (100.3 kg)   LMP 09/27/2019 (Exact Date)   BMI 34.13 kg/m   General Appearance:    Alert, cooperative, no distress, appears stated age  Head:    Normocephalic, without obvious abnormality, atraumatic  Eyes:    PERRL, conjunctiva/corneas clear, EOM's intact, fundi    benign, both eyes  Ears:    Normal TM's and external ear canals, both ears  Nose:   Nares normal, septum midline, mucosa normal, no drainage    or sinus tenderness  Throat:   Lips, mucosa, and tongue normal; teeth and gums normal  Neck:   Supple, symmetrical, trachea midline, no adenopathy;    thyroid:  no enlargement/tenderness/nodules; no carotid   bruit or JVD   Back:     Symmetric, no curvature, ROM normal, no CVA tenderness  Lungs:     Clear to auscultation bilaterally, respirations unlabored  Chest Wall:    No tenderness or deformity   Heart:    Regular rate and rhythm, S1 and S2 normal, no murmur, rub   or gallop  Breast Exam:    No tenderness, masses, or nipple abnormality  Abdomen:     Soft, non-tender, bowel sounds active all four quadrants,    no masses, no organomegaly  Genitalia:    Normal female without lesion, discharge or tenderness, normal size and shape, anteverted, mobile, non-tender, normal adnexal exam      Extremities:   Extremities normal, atraumatic, no cyanosis or edema  Pulses:   2+ and symmetric all extremities  Skin:   Skin color, texture, turgor normal, no rashes or lesions  Lymph nodes:   Cervical, supraclavicular, and axillary nodes normal  Neurologic:   CNII-XII intact, normal strength, sensation and reflexes    throughout  .    Assessment:    Healthy female exam.    Plan:     Thin prep Pap smear. with cotesting Mammogram She declines a flu vaccine today.

## 2019-10-11 LAB — CYTOLOGY - PAP
Comment: NEGATIVE
Diagnosis: NEGATIVE
High risk HPV: NEGATIVE

## 2019-11-07 ENCOUNTER — Telehealth (INDEPENDENT_AMBULATORY_CARE_PROVIDER_SITE_OTHER): Payer: Medicaid Other | Admitting: Obstetrics & Gynecology

## 2019-11-07 DIAGNOSIS — N898 Other specified noninflammatory disorders of vagina: Secondary | ICD-10-CM

## 2019-11-07 MED ORDER — METRONIDAZOLE 500 MG PO TABS: 500.0000 mg | ORAL_TABLET | Freq: Two times a day (BID) | ORAL | 0 refills | Status: DC

## 2019-11-07 NOTE — Addendum Note (Signed)
Addended by: Donn Pierini on: 11/07/2019 01:21 PM   Modules accepted: Orders

## 2019-11-07 NOTE — Telephone Encounter (Signed)
Called pt back about medication refill. Pt reports she would like to get a refill on Flagyl.   Pt has vaginal discharge with odor like fish cabbage. She has had it before and tolerated Flagyl well. Prescription sent per standing protocol.   Pt with no other questions or concerns. Pt to all as needed.

## 2019-11-07 NOTE — Telephone Encounter (Signed)
Patient is requesting a call back about medication refill.

## 2019-11-21 ENCOUNTER — Other Ambulatory Visit: Payer: Self-pay | Admitting: Obstetrics & Gynecology

## 2019-11-28 ENCOUNTER — Encounter: Payer: Self-pay | Admitting: *Deleted

## 2019-11-28 NOTE — Telephone Encounter (Signed)
Tanya Austin still has not gotten her medication filled. She is requesting a call back form clinical staff.

## 2019-12-04 ENCOUNTER — Encounter: Payer: Self-pay | Admitting: *Deleted

## 2019-12-04 ENCOUNTER — Telehealth: Payer: Self-pay | Admitting: *Deleted

## 2019-12-04 NOTE — Telephone Encounter (Signed)
Patient called and left a message the pharmacy did not receive the order. I called [facility] and they confirmed they did not receive the order for the Valtrex. I gave the order over the phone since it shows in our system as sent. They confirmed they will get it ready for the patient. Will send MyChart message to patient Tanya Klee,RN

## 2019-12-15 ENCOUNTER — Telehealth: Payer: Self-pay | Admitting: Student

## 2019-12-15 NOTE — Telephone Encounter (Signed)
Received a call from Tanya Austin requesting a refill on her metronidazole. She is at work, and will not be able to answer the phone after 11:00 am.

## 2019-12-18 ENCOUNTER — Encounter: Payer: Self-pay | Admitting: *Deleted

## 2019-12-25 ENCOUNTER — Ambulatory Visit (INDEPENDENT_AMBULATORY_CARE_PROVIDER_SITE_OTHER): Payer: Medicaid Other | Admitting: General Practice

## 2019-12-25 ENCOUNTER — Other Ambulatory Visit: Payer: Self-pay

## 2019-12-25 DIAGNOSIS — B379 Candidiasis, unspecified: Secondary | ICD-10-CM

## 2019-12-25 MED ORDER — MICONAZOLE NITRATE 2 % VA CREA: 1.0000 | TOPICAL_CREAM | Freq: Every day | VAGINAL | 0 refills | Status: DC

## 2019-12-25 NOTE — Progress Notes (Signed)
Chart reviewed - agree with CMA/RN documentation.  ° °

## 2019-12-25 NOTE — Progress Notes (Signed)
Patient presents to office today reporting burning in her vagina/anus as well as itching externally at the top of her labia since Friday. She reports a little over a week ago she began treatment for BV and finished antibiotics on Sunday. She states when the burning started she thought it might be a herpes outbreak so she took valtrex but didn't experience relief- has not seen lesions. She called after hours service on Saturday and was given Diflucan but it hasn't helped either. Patient denies abnormal discharge or vaginal odor. Discussed with Dr Nehemiah Settle who felt patient needed miconazole x 7 nights- patient to apply some externally as well. Rx sent to pharmacy. Discussed with patient and advised she contact us Wednesday afternoon and if symptoms hadn't improved. Patient verbalized understanding.  Koren Bound RN BSN 12/25/19

## 2019-12-29 ENCOUNTER — Other Ambulatory Visit (HOSPITAL_COMMUNITY)
Admission: RE | Admit: 2019-12-29 | Discharge: 2019-12-29 | Disposition: A | Discharge status: Home / Self Care | Payer: Medicaid Other | Source: Ambulatory Visit | Attending: Obstetrics & Gynecology | Admitting: Obstetrics & Gynecology

## 2019-12-29 ENCOUNTER — Ambulatory Visit (INDEPENDENT_AMBULATORY_CARE_PROVIDER_SITE_OTHER): Payer: Medicaid Other | Admitting: Obstetrics & Gynecology

## 2019-12-29 ENCOUNTER — Other Ambulatory Visit: Payer: Self-pay

## 2019-12-29 VITALS — BP 126/73 | HR 72 | Wt 216.2 lb

## 2019-12-29 DIAGNOSIS — N898 Other specified noninflammatory disorders of vagina: Secondary | ICD-10-CM | POA: Insufficient documentation

## 2019-12-29 DIAGNOSIS — Z0000 Encounter for general adult medical examination without abnormal findings: Secondary | ICD-10-CM

## 2019-12-29 DIAGNOSIS — B379 Candidiasis, unspecified: Secondary | ICD-10-CM

## 2019-12-29 MED ORDER — VALACYCLOVIR HCL 1 G PO TABS: ORAL_TABLET | ORAL | 12 refills | Status: DC

## 2019-12-29 MED ORDER — FLUCONAZOLE 150 MG PO TABS: 150.0000 mg | ORAL_TABLET | Freq: Once | ORAL | 3 refills | Status: AC

## 2019-12-29 NOTE — Progress Notes (Signed)
   Subjective:    Patient ID: Tanya Austin, female    DOB: 07/09/1971, 48 y.o.   MRN: [id number]  HPI 48 yo P4 here today with recurrent bv. She is tired of taking flagyl. She is currently sexually active. She is taking OCPs and her periods are regular, light, non- painful.  She also has a bump on her vulva for the last 10 years that is bothering her.  Review of Systems Pap normal 11/20   She has a new partner since 12/20 Objective:   Physical Exam Breathing, conversing, and ambulating normally Well nourished, well hydrated Black female, no apparent distress Vulva- with 1 cm white head, inclusion cyst I prepped the area with hurricaine spray and betadine. I then injected 1cc of 1% lidocaine and excised the debris. I used silver nitrate to achieve hemostasis.  Spec exam reveals thick curdy discharge      Assessment & Plan:  Preventative care-  Schedule mammogram Recurrent bv- rec boric acid supp QOD at night Check wet prep and cervical culture I also told her that herpes can cause vulvar burning Check STI testing Diflucan with refills

## 2019-12-29 NOTE — Progress Notes (Signed)
Since past Friday.

## 2019-12-30 LAB — HIV ANTIBODY (ROUTINE TESTING W REFLEX): HIV Screen 4th Generation wRfx: NONREACTIVE

## 2019-12-30 LAB — HEPATITIS B SURFACE ANTIGEN: Hepatitis B Surface Ag: NEGATIVE

## 2019-12-30 LAB — HEPATITIS C ANTIBODY: Hep C Virus Ab: <0.1 s/co ratio (ref 0.0–0.9)

## 2019-12-30 LAB — RPR: RPR Ser Ql: NONREACTIVE

## 2020-01-01 LAB — CERVICOVAGINAL ANCILLARY ONLY
Bacterial Vaginitis (gardnerella): NEGATIVE
Candida Glabrata: NEGATIVE
Candida Vaginitis: NEGATIVE
Chlamydia: NEGATIVE
Comment: NEGATIVE
Comment: NEGATIVE
Comment: NEGATIVE
Comment: NEGATIVE
Comment: NEGATIVE
Comment: NORMAL
Neisseria Gonorrhea: NEGATIVE
Trichomonas: NEGATIVE

## 2020-01-02 ENCOUNTER — Telehealth (INDEPENDENT_AMBULATORY_CARE_PROVIDER_SITE_OTHER): Payer: Medicaid Other | Admitting: Family Medicine

## 2020-01-02 DIAGNOSIS — N949 Unspecified condition associated with female genital organs and menstrual cycle: Secondary | ICD-10-CM

## 2020-01-02 MED ORDER — LIDOCAINE HCL 2 % EX GEL: 1.0000 "application " | CUTANEOUS | 0 refills | Status: DC | PRN

## 2020-01-02 NOTE — Telephone Encounter (Signed)
Patient called to say her bottom is sore, and the medication did not work.

## 2020-01-02 NOTE — Telephone Encounter (Signed)
Called patient and she asked about test results. Informed patient test results were negative. Patient asked what is going on because she is still having burning and lots of discharge with a bad smell. She states she finished her diflucan yesterday and it hasn't helped. Patient would also like refill on lidocaine jelly. Told patient I would reach out to Dr Hulan Fray to see what she recommends and to ask about the refill. Told patient I will call her back whenever I hear from Dr Hulan Fray. Patient verbalized understanding.   Spoke with Dr Hulan Fray who states patient can have refill on lidocaine but she is uncertain what could be causing discharge and burning. Advised patient come in for second opinion.   Called and discussed with patient. Patient verbalized understanding and states she will try lidocaine first and if that doesn't help she will call back.

## 2020-01-08 ENCOUNTER — Ambulatory Visit (INDEPENDENT_AMBULATORY_CARE_PROVIDER_SITE_OTHER): Payer: Medicaid Other | Admitting: Family Medicine

## 2020-01-08 ENCOUNTER — Other Ambulatory Visit (HOSPITAL_COMMUNITY)
Admission: RE | Admit: 2020-01-08 | Discharge: 2020-01-08 | Disposition: A | Discharge status: Home / Self Care | Payer: Medicaid Other | Source: Ambulatory Visit | Attending: Family Medicine | Admitting: Family Medicine

## 2020-01-08 ENCOUNTER — Encounter: Payer: Self-pay | Admitting: Family Medicine

## 2020-01-08 ENCOUNTER — Other Ambulatory Visit: Payer: Self-pay

## 2020-01-08 VITALS — BP 144/70 | HR 84 | Wt 214.6 lb

## 2020-01-08 DIAGNOSIS — B9689 Other specified bacterial agents as the cause of diseases classified elsewhere: Secondary | ICD-10-CM | POA: Insufficient documentation

## 2020-01-08 DIAGNOSIS — N760 Acute vaginitis: Secondary | ICD-10-CM

## 2020-01-08 MED ORDER — BORIC ACID POWD: 1.0000 | Freq: Every day | 3 refills | Status: DC

## 2020-01-08 MED ORDER — METRONIDAZOLE 500 MG PO TABS: 500.0000 mg | ORAL_TABLET | Freq: Two times a day (BID) | ORAL | 0 refills | Status: DC

## 2020-01-08 NOTE — Progress Notes (Signed)
   Subjective:    Patient ID: Tanya Austin is a 49 y.o. female presenting with vaginal odor  on 01/08/2020  HPI: Here for recurrent BV--does not like taking flagyl. Also with DM. Taken Diflucan lately due to curdlike d/c last visit, though wet prep is negative.. Notes odor. Smells like BV usually does. Wants to be sure no forgotten condom there. Denies taking baths.   Review of Systems  Constitutional: Negative for chills and fever.  Respiratory: Negative for shortness of breath.   Cardiovascular: Negative for chest pain.  Gastrointestinal: Negative for abdominal pain, nausea and vomiting.  Genitourinary: Negative for dysuria.  Skin: Negative for rash.      Objective:    BP (!) 144/70   Pulse 84   Wt 214 lb 9.6 oz (97.3 kg)   BMI 33.12 kg/m  Physical Exam Constitutional:      General: She is not in acute distress.    Appearance: She is well-developed.  HENT:     Head: Normocephalic and atraumatic.  Eyes:     General: No scleral icterus. Cardiovascular:     Rate and Rhythm: Normal rate.  Pulmonary:     Effort: Pulmonary effort is normal.  Abdominal:     Palpations: Abdomen is soft.  Genitourinary:    General: Normal vulva.     Comments: Thin yellow discharge Musculoskeletal:     Cervical back: Neck supple.  Skin:    General: Skin is warm and dry.  Neurological:     Mental Status: She is alert and oriented to person, place, and time.         Assessment & Plan:   Problem List Items Addressed This Visit      Unprioritized   Bacterial vaginosis - Primary    Check wet prep, treat presumptively. Begin Boric acid to regulate pH. No baths.      Relevant Medications   Boric Acid POWD   metroNIDAZOLE (FLAGYL) 500 MG tablet   Other Relevant Orders   Cervicovaginal ancillary only( [place])      Total face-to-face time with patient: 15 minutes. Over 50% of encounter was spent on counseling and coordination of care. Return if symptoms worsen or fail to  improve.  Donnamae Jude 01/08/2020 1:37 PM

## 2020-01-08 NOTE — Patient Instructions (Signed)
Boric acid 2x/wk, increase to every other day or every day as needed when odor increases.  Bacterial Vaginosis  Bacterial vaginosis is an infection of the vagina. It happens when too many normal germs (healthy bacteria) grow in the vagina. This infection puts you at risk for infections from sex (STIs). Treating this infection can lower your risk for some STIs. You should also treat this if you are pregnant. It can cause your baby to be born early. Follow these instructions at home: Medicines  Take over-the-counter and prescription medicines only as told by your doctor.  Take or use your antibiotic medicine as told by your doctor. Do not stop taking or using it even if you start to feel better. General instructions  If you your sexual partner is a woman, tell her that you have this infection. She needs to get treatment if she has symptoms. If you have a female partner, he does not need to be treated.  During treatment: ? Avoid sex. ? Do not douche. ? Avoid alcohol as told. ? Avoid breastfeeding as told.  Drink enough fluid to keep your pee (urine) clear or pale yellow.  Keep your vagina and butt (rectum) clean. ? Wash the area with warm water every day. ? Wipe from front to back after you use the toilet.  Keep all follow-up visits as told by your doctor. This is important. Preventing this condition  Do not douche.  Use only warm water to wash around your vagina.  Use protection when you have sex. This includes: ? Latex condoms. ? Dental dams.  Limit how many people you have sex with. It is best to only have sex with the same person (be monogamous).  Get tested for STIs. Have your partner get tested.  Wear underwear that is cotton or lined with cotton.  Avoid tight pants and pantyhose. This is most important in summer.  Do not use any products that have nicotine or tobacco in them. These include cigarettes and e-cigarettes. If you need help quitting, ask your doctor.  Do  not use illegal drugs.  Limit how much alcohol you drink. Contact a doctor if:  Your symptoms do not get better, even after you are treated.  You have more discharge or pain when you pee (urinate).  You have a fever.  You have pain in your belly (abdomen).  You have pain with sex.  Your bleed from your vagina between periods. Summary  This infection happens when too many germs (bacteria) grow in the vagina.  Treating this condition can lower your risk for some infections from sex (STIs).  You should also treat this if you are pregnant. It can cause early (premature) birth.  Do not stop taking or using your antibiotic medicine even if you start to feel better. This information is not intended to replace advice given to you by your health care provider. Make sure you discuss any questions you have with your health care provider. Document Revised: 10/29/2017 Document Reviewed: 08/01/2016 Elsevier Patient Education  2020 [employer].

## 2020-01-08 NOTE — Assessment & Plan Note (Signed)
Check wet prep, treat presumptively. Begin Boric acid to regulate pH. No baths.

## 2020-01-09 LAB — CERVICOVAGINAL ANCILLARY ONLY
Bacterial Vaginitis (gardnerella): POSITIVE — AB
Candida Glabrata: NEGATIVE
Candida Vaginitis: NEGATIVE
Chlamydia: NEGATIVE
Comment: NEGATIVE
Comment: NEGATIVE
Comment: NEGATIVE
Comment: NEGATIVE
Comment: NEGATIVE
Comment: NORMAL
Neisseria Gonorrhea: NEGATIVE
Trichomonas: NEGATIVE

## 2020-02-08 ENCOUNTER — Encounter: Payer: Self-pay | Admitting: General Practice

## 2020-05-22 ENCOUNTER — Encounter: Payer: Self-pay | Admitting: *Deleted

## 2020-06-20 ENCOUNTER — Telehealth (INDEPENDENT_AMBULATORY_CARE_PROVIDER_SITE_OTHER): Payer: Medicaid Other | Admitting: Family Medicine

## 2020-06-20 DIAGNOSIS — N760 Acute vaginitis: Secondary | ICD-10-CM

## 2020-06-20 DIAGNOSIS — B9689 Other specified bacterial agents as the cause of diseases classified elsewhere: Secondary | ICD-10-CM

## 2020-06-20 NOTE — Telephone Encounter (Signed)
Patient called into the office wanting to speak to a nurse about a prescription that she need the be filled ( one for BV). Patient request that it be faxed in to her pharmacy because she is currently at work and unable to speak on the phone. Patient stated that she will be available after 5 if there are any questions/concerns. Patient instructed that a message will be sent to the nurses and they will contact her as soon as they can. Patient verbalized understand and message sent to clinical pool.

## 2020-06-25 MED ORDER — METRONIDAZOLE 500 MG PO TABS: 500.0000 mg | ORAL_TABLET | Freq: Two times a day (BID) | ORAL | 0 refills | Status: DC

## 2020-06-25 NOTE — Addendum Note (Signed)
Addended by: Annabell Howells on: 06/25/2020 01:42 PM   Modules accepted: Orders

## 2020-06-25 NOTE — Telephone Encounter (Signed)
Called pt; pt reports white vaginal discharge with foul odor and itching. Pt reports recurrent BV and states this feels the same. Offered a visit for self-swab, pt would like to be treated for BV. States she is familiar with symptoms of yeast vs. BV. Flagyl ordered per protocol. Encouraged pt to continue Boric Acid suppositories and to follow up with our office as needed.

## 2020-08-09 ENCOUNTER — Other Ambulatory Visit: Payer: Self-pay

## 2020-08-09 ENCOUNTER — Other Ambulatory Visit (HOSPITAL_COMMUNITY)
Admission: RE | Admit: 2020-08-09 | Discharge: 2020-08-09 | Disposition: A | Discharge status: Home / Self Care | Payer: Medicaid Other | Source: Ambulatory Visit | Attending: Medical | Admitting: Medical

## 2020-08-09 ENCOUNTER — Ambulatory Visit (INDEPENDENT_AMBULATORY_CARE_PROVIDER_SITE_OTHER): Payer: Medicaid Other | Admitting: *Deleted

## 2020-08-09 DIAGNOSIS — N898 Other specified noninflammatory disorders of vagina: Secondary | ICD-10-CM | POA: Insufficient documentation

## 2020-08-09 DIAGNOSIS — Z202 Contact with and (suspected) exposure to infections with a predominantly sexual mode of transmission: Secondary | ICD-10-CM | POA: Insufficient documentation

## 2020-08-09 NOTE — Progress Notes (Signed)
I have reviewed this chart and agree with the RN/CMA assessment and management.    K. Meryl Skeeter Sheard, M.D. Attending [facility] (Faculty Practice)   

## 2020-08-09 NOTE — Progress Notes (Signed)
Here to check to see if BV is gone. Still having occasional bad odor and normal amount of normal discharge( whitish). Would like metrogel this time if still has BV.  Would like to be tested for GC, Trich, BV, yeast because resumed intercourse with boyfriend after a period apart.  Tanya Austin

## 2020-08-12 LAB — CERVICOVAGINAL ANCILLARY ONLY
Bacterial Vaginitis (gardnerella): POSITIVE — AB
Candida Glabrata: NEGATIVE
Candida Vaginitis: NEGATIVE
Chlamydia: NEGATIVE
Comment: NEGATIVE
Comment: NEGATIVE
Comment: NEGATIVE
Comment: NEGATIVE
Comment: NEGATIVE
Comment: NORMAL
Neisseria Gonorrhea: NEGATIVE
Trichomonas: NEGATIVE

## 2020-08-13 ENCOUNTER — Telehealth: Payer: Self-pay | Admitting: Family Medicine

## 2020-08-13 DIAGNOSIS — N760 Acute vaginitis: Secondary | ICD-10-CM

## 2020-08-13 MED ORDER — METRONIDAZOLE 0.75 % VA GEL: 1.0000 | Freq: Every day | VAGINAL | 0 refills | Status: AC

## 2020-08-13 NOTE — Telephone Encounter (Signed)
Per chart review + BV. Last treated 01/2020. Per protocol will send in RX for Metrogel per pt request. Will notify patient via MyChart message. Jarielys Girardot,RN

## 2020-08-13 NOTE — Telephone Encounter (Signed)
patient called into the office stating that she received her test results on mychart for BV and would like the prescription faxed over to her pharmacy. Patient stated that she would not like the pills but instead she would like a prescription for the gel. Patient stated that she goes to work at 32 and will be available via mychart or a voicemail can be left. Patient instructed that a message will be sent to the nurses and they will contact her as soon as they can. Patient verbalized understanding.

## 2020-08-13 NOTE — Addendum Note (Signed)
Addended by: Samuel Germany on: 08/13/2020 04:04 PM   Modules accepted: Orders

## 2020-08-23 ENCOUNTER — Encounter (HOSPITAL_COMMUNITY): Payer: Self-pay | Admitting: Emergency Medicine

## 2020-08-23 ENCOUNTER — Emergency Department (HOSPITAL_COMMUNITY)
Admission: EM | Admit: 2020-08-23 | Discharge: 2020-08-23 | Disposition: A | Discharge status: Home / Self Care | Payer: Medicaid Other | Attending: Emergency Medicine | Admitting: Emergency Medicine

## 2020-08-23 DIAGNOSIS — U071 COVID-19: Secondary | ICD-10-CM | POA: Insufficient documentation

## 2020-08-23 DIAGNOSIS — Z87891 Personal history of nicotine dependence: Secondary | ICD-10-CM | POA: Insufficient documentation

## 2020-08-23 DIAGNOSIS — Z794 Long term (current) use of insulin: Secondary | ICD-10-CM | POA: Diagnosis not present

## 2020-08-23 DIAGNOSIS — Z7982 Long term (current) use of aspirin: Secondary | ICD-10-CM | POA: Insufficient documentation

## 2020-08-23 DIAGNOSIS — E119 Type 2 diabetes mellitus without complications: Secondary | ICD-10-CM | POA: Insufficient documentation

## 2020-08-23 DIAGNOSIS — R0981 Nasal congestion: Secondary | ICD-10-CM | POA: Diagnosis present

## 2020-08-23 DIAGNOSIS — Z20822 Contact with and (suspected) exposure to covid-19: Secondary | ICD-10-CM

## 2020-08-23 LAB — RESPIRATORY PANEL BY RT PCR (FLU A&B, COVID)
Influenza A by PCR: NEGATIVE
Influenza B by PCR: NEGATIVE
SARS Coronavirus 2 by RT PCR: POSITIVE — AB

## 2020-08-23 NOTE — ED Notes (Signed)
Patient verbalizes understanding of discharge instructions. Opportunity for questioning and answers were provided. Pt discharged from ED. 

## 2020-08-23 NOTE — Discharge Instructions (Addendum)
You likely have covid.this is a virus that should be treated symptomatically.  Use Tylenol ibuprofen as needed for pain. Use over-the-counter cough drops and syrup as needed for cough. Continue taking your home medications as prescribed. You were tested for Covid and flu today.  If your Covid is positive, call the Covid care clinic at [facility] listed below.  They will discuss options for treatment with you. Return to the emergency room if you develop difficulty breathing, severe persistent chest pain, or any new, worsening, or concerning symptoms.

## 2020-08-23 NOTE — ED Triage Notes (Signed)
Pt states family members recently tested positive for covid, pt states started having nasal congestion, prod cough and feeling hot and cold yesterday. Denies loss of taste or smell.

## 2020-08-23 NOTE — ED Provider Notes (Signed)
[facility] EMERGENCY DEPARTMENT Provider Note   CSN: [id number] Arrival date & time: 08/23/20  3875     History Chief Complaint  Patient presents with   Covid Exposure    Tanya Austin is a 49 y.o. female presenting for evaluation of nasal congestion, cough, feeling hot and cold.  Patient states her first symptoms began on Monday, 5 days ago.  She reports a mildly productive cough.  No chest pain or shortness of breath.  No known fevers or chills, but states she feels hot and cold.  She is associated nasal congestion.  Patient states her sister and niece both tested positive for Covid within the past week, and patient had close contact with them few days ago.  Patient reports a history of diabetes and high cholesterol for which she takes medication.  She is unvaccinated for Covid.  She denies nausea, vomiting, abd pain, urinary symptoms, abnormal bowel movements.  She is not taking anything for her symptoms.  No history of asthma or COPD.  HPI     Past Medical History:  Diagnosis Date   Diabetes mellitus    Fibroid    Headache    Herpes    High cholesterol    Hx of gonorrhea 2017    Patient Active Problem List   Diagnosis Date Noted   Vaginal candidiasis 02/23/2017   Bacterial vaginosis 08/04/2016    Past Surgical History:  Procedure Laterality Date   CERVICAL BIOPSY       OB History    Gravida  4   Para  4   Term  4   Preterm  0   AB  0   Living  4     SAB  0   TAB  0   Ectopic  0   Multiple  0   Live Births  4           Family History  Problem Relation Age of Onset   Hypertension Other    Hyperlipidemia Other    Heart disease Other    Hypertension Mother    Diabetes Mother    Hypertension Father     Social History   Tobacco Use   Smoking status: Former Smoker    Types: Cigarettes    Quit date: 06/09/2000    Years since quitting: 20.2   Smokeless tobacco: Never Used  Vaping Use    Vaping Use: Never used  Substance Use Topics   Alcohol use: No    Comment: Socially on weekends   Drug use: No    Types: Marijuana    Home Medications Prior to Admission medications   Medication Sig Start Date End Date Taking? Authorizing Provider  acetaminophen (TYLENOL) 500 MG tablet Take 1,000 mg by mouth 2 (two) times daily as needed for moderate pain.    [provider]  ALPRAZolam Duanne Moron) 0.5 MG tablet Take 0.5 mg by mouth 2 (two) times daily as needed for anxiety.    [provider]  Aspirin-Salicylamide-Caffeine (BC HEADACHE POWDER PO) Take 1 packet by mouth daily as needed (For headache.).    [provider]  Boric Acid POWD 1 capsule by Does not apply route at bedtime. 01/08/20   Donnamae Jude, MD  Cholecalciferol (VITAMIN D3) 3000 units TABS Take 1 capsule by mouth daily.     [provider]  fish oil-omega-3 fatty acids 1000 MG capsule Take 1 g by mouth daily.    [provider]  fluconazole (DIFLUCAN)  150 MG tablet Take 1 tablet (150 mg total) by mouth daily. Take one today and repeat in 3 days Patient not taking: Reported on 10/06/2019 09/27/18   Rasch, Anderson Malta I, NP  Insulin Glargine (TOUJEO SOLOSTAR) 300 UNIT/ML SOPN Inject 14 Units into the skin at bedtime.    [provider]  IRON PO Take 1 tablet by mouth daily. otc medication- not sure of dose    [provider]  lidocaine (XYLOCAINE) 2 % jelly Apply 1 application topically as needed. 01/02/20   Emily Filbert, MD  methocarbamol (ROBAXIN) 500 MG tablet Take 1-2 tablets (500-1,000 mg total) by mouth every 6 (six) hours as needed for muscle spasms. Patient not taking: Reported on 08/09/2020 10/24/17   Gareth Morgan, MD  metroNIDAZOLE (FLAGYL) 500 MG tablet Take 1 tablet (500 mg total) by mouth 2 (two) times daily. 06/25/20   Donnamae Jude, MD  miconazole (MICONAZOLE 7) 2 % vaginal cream Place 1 Applicatorful vaginally at bedtime. 12/25/19   Truett Mainland, DO    Norgestimate-Ethinyl Estradiol Triphasic (TRINESSA, 28,) 0.18/0.215/0.25 MG-35 MCG tablet Take 1 tablet by mouth daily.    [provider]  ondansetron (ZOFRAN) 4 MG tablet Take 1 tablet (4 mg total) by mouth every 8 (eight) hours as needed for nausea or vomiting. 10/21/17   Rolland Porter, MD  pioglitazone (ACTOS) 30 MG tablet Take 30 mg by mouth daily. 02/21/16   [provider]  Probiotic Product (PROBIOTIC DAILY PO) Take 1 capsule by mouth daily.    [provider]  promethazine (PHENERGAN) 25 MG tablet Take 1 tablet (25 mg total) by mouth every 6 (six) hours as needed for nausea or vomiting. 10/24/17   Gareth Morgan, MD  simvastatin (ZOCOR) 20 MG tablet Take 20 mg by mouth at bedtime.     [provider]  sitaGLIPtin-metformin (JANUMET) 50-1000 MG per tablet Take 1 tablet by mouth 2 (two) times daily with a meal.    [provider]  valACYclovir (VALTREX) 1000 MG tablet Take 1 tablet daily with no outbreak. Take 1 tablet BID with an outbreak 12/29/19   Emily Filbert, MD    Allergies    Patient has no known allergies.  Review of Systems   Review of Systems  HENT: Positive for congestion.   Respiratory: Positive for cough.   All other systems reviewed and are negative.   Physical Exam Updated Vital Signs BP (!) 119/99 (BP Location: Left Arm)    Pulse 96    Temp 98.7 F (37.1 C) (Oral)    Resp 16    SpO2 100%   Physical Exam Vitals and nursing note reviewed.  Constitutional:      General: She is not in acute distress.    Appearance: She is well-developed. She is obese.     Comments: Resting in the bed in no acute distress  HENT:     Head: Normocephalic and atraumatic.  Eyes:     Extraocular Movements: Extraocular movements intact.     Conjunctiva/sclera: Conjunctivae normal.     Pupils: Pupils are equal, round, and reactive to light.  Cardiovascular:     Rate and Rhythm: Normal rate and regular rhythm.     Pulses: Normal pulses.   Pulmonary:     Effort: Pulmonary effort is normal. No respiratory distress.     Breath sounds: Normal breath sounds. No wheezing.     Comments: Intermittent cough noted on exam.  Speaking in full sentences.  Clear lung sounds  in all fields.  Sats stable on room air. Abdominal:     General: There is no distension.     Palpations: Abdomen is soft. There is no mass.     Tenderness: There is no abdominal tenderness. There is no guarding or rebound.  Musculoskeletal:        General: Normal range of motion.     Cervical back: Normal range of motion and neck supple.  Skin:    General: Skin is warm and dry.     Capillary Refill: Capillary refill takes less than 2 seconds.  Neurological:     Mental Status: She is alert and oriented to person, place, and time.     ED Results / Procedures / Treatments   Labs (all labs ordered are listed, but only abnormal results are displayed) Labs Reviewed  RESPIRATORY PANEL BY RT PCR (FLU A&B, COVID)    EKG None  Radiology No results found.  Procedures Procedures (including critical care time)  Medications Ordered in ED Medications - No data to display  ED Course  I have reviewed the triage vital signs and the nursing notes.  Pertinent labs & imaging results that were available during my care of the patient were reviewed by me and considered in my medical decision making (see chart for details).    MDM Rules/Calculators/A&P                          Patient presenting for evaluation of URI symptoms.  She has had multiple close contacts with Covid positive people in the past week.  As such, high suspicion for Covid.  Patient is afebrile, and has a reassuring pulmonary exam.  I did not believe she needs chest x-ray, as I have low suspicion for bacterial pneumonia.  I do not like she needs any admitted to the hospital for possible Covid.  Will test, patient is aware results are pending.  Will have patient follow-up with a post Covid clinic if  testing is positive, they can discuss possible Mab infusion as needed.  Encourage patient to continue symptomatic treatment and discussed importance of quarantine.  At this time, patient appears safe for discharge.  Return precautions given.  Patient states she understands and agrees to plan.  Tanya Austin was evaluated in Emergency Department on 08/23/2020 for the symptoms described in the history of present illness. She was evaluated in the context of the global COVID-19 pandemic, which necessitated consideration that the patient might be at risk for infection with the SARS-CoV-2 virus that causes COVID-19. Institutional protocols and algorithms that pertain to the evaluation of patients at risk for COVID-19 are in a state of rapid change based on information released by regulatory bodies including the CDC and federal and state organizations. These policies and algorithms were followed during the patient's care in the ED.  Final Clinical Impression(s) / ED Diagnoses Final diagnoses:  Suspected COVID-19 virus infection    Rx / DC Orders ED Discharge Orders    None       Franchot Heidelberg, PA-C 08/23/20 1033    Lennice Sites, DO 08/23/20 1112

## 2020-08-24 ENCOUNTER — Telehealth: Payer: Self-pay | Admitting: Unknown Physician Specialty

## 2020-08-24 ENCOUNTER — Telehealth (HOSPITAL_COMMUNITY): Payer: Self-pay | Admitting: Adult Health

## 2020-08-24 ENCOUNTER — Other Ambulatory Visit: Payer: Self-pay | Admitting: Unknown Physician Specialty

## 2020-08-24 ENCOUNTER — Ambulatory Visit (HOSPITAL_COMMUNITY)
Admission: RE | Admit: 2020-08-24 | Discharge: 2020-08-24 | Disposition: A | Discharge status: Home / Self Care | Payer: Medicaid Other | Source: Ambulatory Visit | Attending: Pulmonary Disease | Admitting: Pulmonary Disease

## 2020-08-24 DIAGNOSIS — E119 Type 2 diabetes mellitus without complications: Secondary | ICD-10-CM | POA: Insufficient documentation

## 2020-08-24 DIAGNOSIS — U071 COVID-19: Secondary | ICD-10-CM

## 2020-08-24 MED ORDER — DIPHENHYDRAMINE HCL 50 MG/ML IJ SOLN: 50.0000 mg | Freq: Once | INTRAMUSCULAR | Status: DC | PRN

## 2020-08-24 MED ORDER — METHYLPREDNISOLONE SODIUM SUCC 125 MG IJ SOLR: 125.0000 mg | Freq: Once | INTRAMUSCULAR | Status: DC | PRN

## 2020-08-24 MED ORDER — ALBUTEROL SULFATE HFA 108 (90 BASE) MCG/ACT IN AERS: 2.0000 | INHALATION_SPRAY | Freq: Once | RESPIRATORY_TRACT | Status: DC | PRN

## 2020-08-24 MED ORDER — SODIUM CHLORIDE 0.9 % IV SOLN: INTRAVENOUS | Status: DC | PRN

## 2020-08-24 MED ORDER — SODIUM CHLORIDE 0.9 % IV SOLN
1200.0000 mg | Freq: Once | INTRAVENOUS | Status: AC
  Administered 2020-08-24: 1200 mg via INTRAVENOUS

## 2020-08-24 MED ORDER — ACETAMINOPHEN 325 MG PO TABS
650.0000 mg | ORAL_TABLET | Freq: Four times a day (QID) | ORAL | Status: DC | PRN
  Administered 2020-08-24: 650 mg via ORAL
  Filled 2020-08-24: qty 2

## 2020-08-24 MED ORDER — FAMOTIDINE IN NACL 20-0.9 MG/50ML-% IV SOLN: 20.0000 mg | Freq: Once | INTRAVENOUS | Status: DC | PRN

## 2020-08-24 MED ORDER — EPINEPHRINE 0.3 MG/0.3ML IJ SOAJ: 0.3000 mg | Freq: Once | INTRAMUSCULAR | Status: DC | PRN

## 2020-08-24 NOTE — Discharge Instructions (Signed)

## 2020-08-24 NOTE — Telephone Encounter (Signed)
Called and LMOM regarding monoclonal antibody treatment for COVID 19 given to those who are at risk for complications and/or hospitalization of the virus.  Patient meets criteria based on: diabetes and BMI  Call back number given: [phone]  My chart message: sent  Wilber Bihari, NP

## 2020-08-24 NOTE — Progress Notes (Signed)
  Diagnosis: COVID-19  Physician: Dr. Joya Gaskins  Procedure: Covid Infusion Clinic Med: casirivimab\imdevimab infusion - Provided patient with casirivimab\imdevimab fact sheet for patients, parents and caregivers prior to infusion.  Complications: No immediate complications noted.  Discharge: Discharged home   Tanya Austin 08/24/2020

## 2020-08-24 NOTE — Telephone Encounter (Signed)
I connected by phone with Tanya Austin on 08/24/2020 at 2:07 PM to discuss the potential use of a new treatment for mild to moderate COVID-19 viral infection in non-hospitalized patients.  This patient is a 49 y.o. female that meets the FDA criteria for Emergency Use Authorization of COVID monoclonal antibody casirivimab/imdevimab or bamlanivimab/eteseviamb.  Has a (+) direct SARS-CoV-2 viral test result  Has mild or moderate COVID-19   Is NOT hospitalized due to COVID-19  Is within 10 days of symptom onset  Has at least one of the high risk factor(s) for progression to severe COVID-19 and/or hospitalization as defined in EUA.  Specific high risk criteria : BMI > 25, Diabetes and Cardiovascular disease or hypertension   I have spoken and communicated the following to the patient or parent/caregiver regarding COVID monoclonal antibody treatment:  1. FDA has authorized the emergency use for the treatment of mild to moderate COVID-19 in adults and pediatric patients with positive results of direct SARS-CoV-2 viral testing who are 75 years of age and older weighing at least 40 kg, and who are at high risk for progressing to severe COVID-19 and/or hospitalization.  2. The significant known and potential risks and benefits of COVID monoclonal antibody, and the extent to which such potential risks and benefits are unknown.  3. Information on available alternative treatments and the risks and benefits of those alternatives, including clinical trials.  4. Patients treated with COVID monoclonal antibody should continue to self-isolate and use infection control measures (e.g., wear mask, isolate, social distance, avoid sharing personal items, clean and disinfect high touch surfaces, and frequent handwashing) according to CDC guidelines.   5. The patient or parent/caregiver has the option to accept or refuse COVID monoclonal antibody treatment.  After reviewing this information with the  patient, the patient has agreed to receive one of the available covid 19 monoclonal antibodies and will be provided an appropriate fact sheet prior to infusion. Tanya Haddock, NP 08/24/2020 2:07 PM  Sx onset 9/20

## 2020-08-29 ENCOUNTER — Telehealth: Payer: Self-pay | Admitting: Family Medicine

## 2020-08-29 NOTE — Telephone Encounter (Signed)
Pt was referred by [facility]. Appt made w/ [facility] for 10/12

## 2020-09-10 ENCOUNTER — Ambulatory Visit
Admission: RE | Admit: 2020-09-10 | Discharge: 2020-09-10 | Disposition: A | Discharge status: Home / Self Care | Payer: Medicaid Other | Source: Ambulatory Visit | Attending: Nurse Practitioner | Admitting: Nurse Practitioner

## 2020-09-10 ENCOUNTER — Ambulatory Visit (INDEPENDENT_AMBULATORY_CARE_PROVIDER_SITE_OTHER): Payer: Medicaid Other | Admitting: Nurse Practitioner

## 2020-09-10 ENCOUNTER — Other Ambulatory Visit: Payer: Self-pay

## 2020-09-10 VITALS — BP 122/82 | HR 70 | Temp 98.5°F | Wt 200.0 lb

## 2020-09-10 DIAGNOSIS — R059 Cough, unspecified: Secondary | ICD-10-CM | POA: Diagnosis not present

## 2020-09-10 DIAGNOSIS — Z8616 Personal history of covid-19: Secondary | ICD-10-CM

## 2020-09-10 HISTORY — DX: Personal history of COVID-19: Z86.16

## 2020-09-10 MED ORDER — ALBUTEROL SULFATE HFA 108 (90 BASE) MCG/ACT IN AERS: 2.0000 | INHALATION_SPRAY | Freq: Four times a day (QID) | RESPIRATORY_TRACT | 0 refills | Status: DC | PRN

## 2020-09-10 MED ORDER — IBUPROFEN 600 MG PO TABS: 600.0000 mg | ORAL_TABLET | Freq: Three times a day (TID) | ORAL | 0 refills | Status: AC | PRN

## 2020-09-10 NOTE — Progress Notes (Signed)
@Patient  ID: Tanya Austin, female    DOB: March 04, 1971, 49 y.o.   MRN: [id number]  Chief Complaint  Patient presents with  . New Patient (Initial Visit)    COVID Pos 9/24 Sx: Cough, SOB, Night sweats, headache    Referring provider: Lucianne Lei, MD  49 year old female with history of diabetes, hyperlipidemia.  HPI  Patient presents today for post COVID care clinic visit.  Patient was diagnosed with Covid on 08/23/2020.  She was seen in the ED on that day.  Patient did receive monoclonal antibody infusion on 08/24/2020.  Patient states that she is still having issues with cough, shortness of breath with exertion, night sweats, headaches.  Patient is trying to stay active.  Vital signs and O2 sats were stable today. Denies f/c/s, n/v/d, hemoptysis, PND, chest pain or edema.      No Known Allergies  Immunization History  Administered Date(s) Administered  . PPD Test 02/10/2016  . Tdap 07/07/2018    Past Medical History:  Diagnosis Date  . Diabetes mellitus   . Fibroid   . Headache   . Herpes   . High cholesterol   . Hx of gonorrhea 2017    Tobacco History: Social History   Tobacco Use  Smoking Status Former Smoker  . Types: Cigarettes  . Quit date: 06/09/2000  . Years since quitting: 20.2  Smokeless Tobacco Never Used   Counseling given: Not Answered   Outpatient Encounter Medications as of 09/10/2020  Medication Sig  . acetaminophen (TYLENOL) 500 MG tablet Take 1,000 mg by mouth 2 (two) times daily as needed for moderate pain.  . Cholecalciferol (VITAMIN D3) 3000 units TABS Take 1 capsule by mouth daily.   . fish oil-omega-3 fatty acids 1000 MG capsule Take 1 g by mouth daily.  . IRON PO Take 1 tablet by mouth daily. otc medication- not sure of dose  . lidocaine (XYLOCAINE) 2 % jelly Apply 1 application topically as needed.  . Norgestimate-Ethinyl Estradiol Triphasic (TRINESSA, 28,) 0.18/0.215/0.25 MG-35 MCG tablet Take 1 tablet by mouth daily.  .  pioglitazone (ACTOS) 30 MG tablet Take 30 mg by mouth daily.  . Probiotic Product (PROBIOTIC DAILY PO) Take 1 capsule by mouth daily.  . simvastatin (ZOCOR) 20 MG tablet Take 20 mg by mouth at bedtime.   . sitaGLIPtin-metformin (JANUMET) 50-1000 MG per tablet Take 1 tablet by mouth 2 (two) times daily with a meal.  . valACYclovir (VALTREX) 1000 MG tablet Take 1 tablet daily with no outbreak. Take 1 tablet BID with an outbreak  . albuterol (VENTOLIN HFA) 108 (90 Base) MCG/ACT inhaler Inhale 2 puffs into the lungs every 6 (six) hours as needed for wheezing or shortness of breath.  . ALPRAZolam (XANAX) 0.5 MG tablet Take 0.5 mg by mouth 2 (two) times daily as needed for anxiety. (Patient not taking: Reported on 09/10/2020)  . Aspirin-Salicylamide-Caffeine (BC HEADACHE POWDER PO) Take 1 packet by mouth daily as needed (For headache.). (Patient not taking: Reported on 09/10/2020)  . B-D UF III MINI PEN NEEDLES 31G X 5 MM MISC SMARTSIG:SUB-Q 1 to 2 Times Daily  . Boric Acid POWD 1 capsule by Does not apply route at bedtime. (Patient not taking: Reported on 09/10/2020)  . hydrOXYzine (VISTARIL) 25 MG capsule SMARTSIG:1-2 Capsule(s) By Mouth Every Evening  . ibuprofen (ADVIL) 600 MG tablet Take 1 tablet (600 mg total) by mouth every 8 (eight) hours as needed for up to 3 days.  . Insulin Glargine (TOUJEO SOLOSTAR) 300 UNIT/ML SOPN Inject  14 Units into the skin at bedtime. (Patient not taking: Reported on 09/10/2020)  . miconazole (MICONAZOLE 7) 2 % vaginal cream Place 1 Applicatorful vaginally at bedtime. (Patient not taking: Reported on 09/10/2020)  . ondansetron (ZOFRAN) 4 MG tablet Take 1 tablet (4 mg total) by mouth every 8 (eight) hours as needed for nausea or vomiting. (Patient not taking: Reported on 09/10/2020)  . pantoprazole (PROTONIX) 40 MG tablet Take 40 mg by mouth daily.  . promethazine (PHENERGAN) 25 MG tablet Take 1 tablet (25 mg total) by mouth every 6 (six) hours as needed for nausea or  vomiting. (Patient not taking: Reported on 09/10/2020)  . RESTASIS 0.05 % ophthalmic emulsion 1 drop 2 (two) times daily.  . [DISCONTINUED] fluconazole (DIFLUCAN) 150 MG tablet Take 1 tablet (150 mg total) by mouth daily. Take one today and repeat in 3 days (Patient not taking: Reported on 10/06/2019)  . [DISCONTINUED] methocarbamol (ROBAXIN) 500 MG tablet Take 1-2 tablets (500-1,000 mg total) by mouth every 6 (six) hours as needed for muscle spasms. (Patient not taking: Reported on 08/09/2020)  . [DISCONTINUED] metroNIDAZOLE (FLAGYL) 500 MG tablet Take 1 tablet (500 mg total) by mouth 2 (two) times daily.   No facility-administered encounter medications on file as of 09/10/2020.     Review of Systems  Review of Systems  Constitutional: Negative.  Negative for fatigue and fever.  HENT: Negative.   Respiratory: Positive for cough and shortness of breath.   Cardiovascular: Negative.   Gastrointestinal: Negative.   Allergic/Immunologic: Negative.   Neurological: Positive for headaches.  Psychiatric/Behavioral: Negative.        Physical Exam  BP 122/82 (BP Location: Left Arm)   Pulse 70   Temp 98.5 F (36.9 C)   Wt 200 lb (90.7 kg)   SpO2 98%   BMI 30.86 kg/m   Wt Readings from Last 5 Encounters:  09/10/20 200 lb (90.7 kg)  01/08/20 214 lb 9.6 oz (97.3 kg)  12/29/19 216 lb 3.2 oz (98.1 kg)  10/06/19 221 lb 3.2 oz (100.3 kg)  09/27/18 225 lb (102.1 kg)     Physical Exam Vitals and nursing note reviewed.  Constitutional:      General: She is not in acute distress.    Appearance: She is well-developed.  Cardiovascular:     Rate and Rhythm: Normal rate and regular rhythm.  Pulmonary:     Effort: Pulmonary effort is normal.     Breath sounds: Normal breath sounds.  Musculoskeletal:     Right lower leg: No edema.     Left lower leg: No edema.  Neurological:     Mental Status: She is alert and oriented to person, place, and time.  Psychiatric:        Mood and Affect:  Mood normal.        Behavior: Behavior normal.        Assessment & Plan:   History of COVID-19 Cough:   Stay well hydrated  Stay active  Deep breathing exercises  May take mucinex DM twice daily  May start zyrtec  Will order chest x ray  Will order albuterol inhaler   Headache:  Will order ibuprofen to use sparingly   Follow up:  Follow up in 2 weeks or sooner if needed      Fenton Foy, NP 09/10/2020

## 2020-09-10 NOTE — Patient Instructions (Addendum)
Covid 19 Cough:   Stay well hydrated  Stay active  Deep breathing exercises  May take mucinex DM twice daily  May start zyrtec  Will order chest x ray  Will order albuterol inhaler   Headache:  Will order ibuprofen to use sparingly   Follow up:  Follow up in 2 weeks or sooner if needed

## 2020-09-10 NOTE — Assessment & Plan Note (Signed)
Cough:   Stay well hydrated  Stay active  Deep breathing exercises  May take mucinex DM twice daily  May start zyrtec  Will order chest x ray  Will order albuterol inhaler   Headache:  Will order ibuprofen to use sparingly   Follow up:  Follow up in 2 weeks or sooner if needed

## 2020-09-11 ENCOUNTER — Other Ambulatory Visit: Payer: Self-pay | Admitting: Nurse Practitioner

## 2020-09-12 LAB — NOVEL CORONAVIRUS, NAA: SARS-CoV-2, NAA: NOT DETECTED

## 2020-09-12 LAB — SARS-COV-2, NAA 2 DAY TAT

## 2020-09-25 ENCOUNTER — Ambulatory Visit (INDEPENDENT_AMBULATORY_CARE_PROVIDER_SITE_OTHER): Payer: Medicaid Other | Admitting: *Deleted

## 2020-09-25 ENCOUNTER — Encounter (HOSPITAL_COMMUNITY): Payer: Self-pay

## 2020-09-25 ENCOUNTER — Emergency Department (HOSPITAL_COMMUNITY)
Admission: EM | Admit: 2020-09-25 | Discharge: 2020-09-25 | Disposition: A | Discharge status: Home / Self Care | Payer: Medicaid Other | Attending: Emergency Medicine | Admitting: Emergency Medicine

## 2020-09-25 ENCOUNTER — Other Ambulatory Visit: Payer: Self-pay

## 2020-09-25 ENCOUNTER — Encounter: Payer: Self-pay | Admitting: *Deleted

## 2020-09-25 ENCOUNTER — Other Ambulatory Visit (HOSPITAL_COMMUNITY)
Admission: RE | Admit: 2020-09-25 | Discharge: 2020-09-25 | Disposition: A | Discharge status: Home / Self Care | Payer: Medicaid Other | Source: Ambulatory Visit | Attending: Family Medicine | Admitting: Family Medicine

## 2020-09-25 VITALS — BP 120/67 | HR 73 | Ht 67.25 in | Wt 208.0 lb

## 2020-09-25 DIAGNOSIS — Z794 Long term (current) use of insulin: Secondary | ICD-10-CM | POA: Insufficient documentation

## 2020-09-25 DIAGNOSIS — N898 Other specified noninflammatory disorders of vagina: Secondary | ICD-10-CM

## 2020-09-25 DIAGNOSIS — Z8616 Personal history of COVID-19: Secondary | ICD-10-CM | POA: Insufficient documentation

## 2020-09-25 DIAGNOSIS — M79604 Pain in right leg: Secondary | ICD-10-CM | POA: Insufficient documentation

## 2020-09-25 DIAGNOSIS — E119 Type 2 diabetes mellitus without complications: Secondary | ICD-10-CM | POA: Diagnosis not present

## 2020-09-25 DIAGNOSIS — Z87891 Personal history of nicotine dependence: Secondary | ICD-10-CM | POA: Insufficient documentation

## 2020-09-25 DIAGNOSIS — M5417 Radiculopathy, lumbosacral region: Secondary | ICD-10-CM

## 2020-09-25 DIAGNOSIS — M5416 Radiculopathy, lumbar region: Secondary | ICD-10-CM | POA: Diagnosis not present

## 2020-09-25 DIAGNOSIS — M549 Dorsalgia, unspecified: Secondary | ICD-10-CM | POA: Diagnosis present

## 2020-09-25 MED ORDER — MELOXICAM 7.5 MG PO TABS: 7.5000 mg | ORAL_TABLET | Freq: Every day | ORAL | 0 refills | Status: DC

## 2020-09-25 MED ORDER — KETOROLAC TROMETHAMINE 15 MG/ML IJ SOLN
15.0000 mg | Freq: Once | INTRAMUSCULAR | Status: AC
  Administered 2020-09-25: 15 mg via INTRAMUSCULAR
  Filled 2020-09-25: qty 1

## 2020-09-25 MED ORDER — METHOCARBAMOL 500 MG PO TABS: 500.0000 mg | ORAL_TABLET | Freq: Four times a day (QID) | ORAL | 0 refills | Status: DC

## 2020-09-25 MED ORDER — DEXAMETHASONE SODIUM PHOSPHATE 10 MG/ML IJ SOLN
10.0000 mg | Freq: Once | INTRAMUSCULAR | Status: AC
  Administered 2020-09-25: 10 mg via INTRAMUSCULAR
  Filled 2020-09-25: qty 1

## 2020-09-25 MED ORDER — OXYCODONE HCL 5 MG PO TABS
5.0000 mg | ORAL_TABLET | Freq: Once | ORAL | Status: AC
  Administered 2020-09-25: 5 mg via ORAL
  Filled 2020-09-25: qty 1

## 2020-09-25 NOTE — Discharge Instructions (Signed)
Please read and follow all provided instructions.  Your diagnoses today include:  1. Lumbosacral radiculopathy     Tests performed today include:  Vital signs - see below for your results today  Medications prescribed:   Robaxin (methocarbamol) - muscle relaxer medication  DO NOT drive or perform any activities that require you to be awake and alert because this medicine can make you drowsy.    Meloxicam - anti-inflammatory pain medication  You have been prescribed an anti-inflammatory medication or NSAID. Take with food. Do not take aspirin, ibuprofen, or naproxen if taking this medication. Take smallest effective dose for the shortest duration needed for your pain. Stop taking if you experience stomach pain or vomiting.   Take any prescribed medications only as directed.  Home care instructions:   Follow any educational materials contained in this packet  Please rest, use ice or heat on your back for the next several days  Do not lift, push, pull anything more than 10 pounds for the next week  Follow-up instructions: Please follow-up with your primary care provider in the next 1 week for further evaluation of your symptoms.   Return instructions:  SEEK IMMEDIATE MEDICAL ATTENTION IF YOU HAVE:  New numbness, tingling, weakness, or problem with the use of your arms or legs  Severe back pain not relieved with medications  Loss control of your bowels or bladder  Increasing pain in any areas of the body (such as chest or abdominal pain)  Shortness of breath, dizziness, or fainting.   Worsening nausea (feeling sick to your stomach), vomiting, fever, or sweats  Any other emergent concerns regarding your health   Additional Information:  Your vital signs today were: BP 123/77 (BP Location: Right Arm)    Pulse 84    Temp 98.2 F (36.8 C) (Oral)    Resp 14    SpO2 97%  If your blood pressure (BP) was elevated above 135/85 this visit, please have this repeated by your  doctor within one month. --------------

## 2020-09-25 NOTE — ED Provider Notes (Signed)
[facility] EMERGENCY DEPARTMENT Provider Note   CSN: [id number] Arrival date & time: 09/25/20  1049     History Chief Complaint  Patient presents with  . Back Pain    Tanya Austin is a 49 y.o. female.  Patient with history of diabetes presents the emergency department for evaluation of right leg pain ongoing over the past 2 weeks.  Patient describes a "toothache" throbbing pain that shoots down her right buttocks, hip, thigh down into her lower leg.  At times she has pain in her heel and pain with bearing weight.  She denies recent injuries.  She has tried heating pad at home as well as NSAIDs.  Patient denies warning symptoms of back pain including: fecal incontinence, urinary retention or overflow incontinence, night sweats, waking from sleep with back pain, unexplained fevers or weight loss, h/o cancer, IVDU.  She does not report weakness in her leg, foot drop, tripping.         Past Medical History:  Diagnosis Date  . Diabetes mellitus   . Fibroid   . Headache   . Herpes   . High cholesterol   . Hx of gonorrhea 2017    Patient Active Problem List   Diagnosis Date Noted  . History of COVID-19 09/10/2020  . Cough 09/10/2020  . Vaginal candidiasis 02/23/2017  . Bacterial vaginosis 08/04/2016    Past Surgical History:  Procedure Laterality Date  . CERVICAL BIOPSY       OB History    Gravida  4   Para  4   Term  4   Preterm  0   AB  0   Living  4     SAB  0   TAB  0   Ectopic  0   Multiple  0   Live Births  4           Family History  Problem Relation Age of Onset  . Hypertension Other   . Hyperlipidemia Other   . Heart disease Other   . Hypertension Mother   . Diabetes Mother   . Hypertension Father     Social History   Tobacco Use  . Smoking status: Former Smoker    Types: Cigarettes    Quit date: 06/09/2000    Years since quitting: 20.3  . Smokeless tobacco: Never Used  Vaping Use  . Vaping Use:  Never used  Substance Use Topics  . Alcohol use: No    Comment: Socially on weekends  . Drug use: No    Types: Marijuana    Home Medications Prior to Admission medications   Medication Sig Start Date End Date Taking? Authorizing Provider  acetaminophen (TYLENOL) 500 MG tablet Take 1,000 mg by mouth 2 (two) times daily as needed for moderate pain.    [provider]  albuterol (VENTOLIN HFA) 108 (90 Base) MCG/ACT inhaler Inhale 2 puffs into the lungs every 6 (six) hours as needed for wheezing or shortness of breath. 09/10/20   Fenton Foy, NP  B-D UF III MINI PEN NEEDLES 31G X 5 MM MISC SMARTSIG:SUB-Q 1 to 2 Times Daily 05/14/20   [provider]  Cholecalciferol (VITAMIN D3) 3000 units TABS Take 1 capsule by mouth daily.     [provider]  fish oil-omega-3 fatty acids 1000 MG capsule Take 1 g by mouth daily.    [provider]  hydrOXYzine (VISTARIL) 25 MG capsule SMARTSIG:1-2 Capsule(s) By Mouth Every Evening 07/25/20  [provider]  IRON PO Take 1 tablet by mouth daily. otc medication- not sure of dose    [provider]  lidocaine (XYLOCAINE) 2 % jelly Apply 1 application topically as needed. 01/02/20   Emily Filbert, MD  meloxicam (MOBIC) 7.5 MG tablet Take 1 tablet (7.5 mg total) by mouth daily. 09/25/20   Carlisle Cater, PA-C  methocarbamol (ROBAXIN) 500 MG tablet Take 1 tablet (500 mg total) by mouth 4 (four) times daily. 09/25/20   Carlisle Cater, PA-C  Norgestimate-Ethinyl Estradiol Triphasic (TRINESSA, 28,) 0.18/0.215/0.25 MG-35 MCG tablet Take 1 tablet by mouth daily.    [provider]  pantoprazole (PROTONIX) 40 MG tablet Take 40 mg by mouth daily. 08/23/20   [provider]  pioglitazone (ACTOS) 30 MG tablet Take 30 mg by mouth daily. 02/21/16   [provider]  Probiotic Product (PROBIOTIC DAILY PO) Take 1 capsule by mouth daily.    [provider]  RESTASIS 0.05 % ophthalmic emulsion  1 drop 2 (two) times daily. 09/05/20   [provider]  simvastatin (ZOCOR) 20 MG tablet Take 20 mg by mouth at bedtime.     [provider]  sitaGLIPtin-metformin (JANUMET) 50-1000 MG per tablet Take 1 tablet by mouth 2 (two) times daily with a meal.    [provider]  valACYclovir (VALTREX) 1000 MG tablet Take 1 tablet daily with no outbreak. Take 1 tablet BID with an outbreak 12/29/19   Emily Filbert, MD  Insulin Glargine (TOUJEO SOLOSTAR) 300 UNIT/ML SOPN Inject 14 Units into the skin at bedtime. Patient not taking: Reported on 09/10/2020  09/25/20  [provider]  promethazine (PHENERGAN) 25 MG tablet Take 1 tablet (25 mg total) by mouth every 6 (six) hours as needed for nausea or vomiting. Patient not taking: Reported on 09/10/2020 10/24/17 09/25/20  Gareth Morgan, MD    Allergies    Patient has no known allergies.  Review of Systems   Review of Systems  Constitutional: Negative for fever and unexpected weight change.  Gastrointestinal: Negative for constipation.       Negative for fecal incontinence.   Genitourinary: Negative for dysuria, flank pain, hematuria and pelvic pain.       Negative for urinary incontinence or retention.  Musculoskeletal: Positive for back pain.  Neurological: Negative for weakness and numbness.       Denies saddle paresthesias.    Physical Exam Updated Vital Signs BP 123/77 (BP Location: Right Arm)   Pulse 84   Temp 98.2 F (36.8 C) (Oral)   Resp 14   SpO2 97%   Physical Exam Vitals and nursing note reviewed.  Constitutional:      Appearance: She is well-developed.  HENT:     Head: Normocephalic and atraumatic.  Eyes:     Conjunctiva/sclera: Conjunctivae normal.  Pulmonary:     Effort: Pulmonary effort is normal.  Abdominal:     Palpations: Abdomen is soft.     Tenderness: There is no abdominal tenderness.  Musculoskeletal:        General: Normal range of motion.     Cervical back: Normal range of  motion and neck supple.     Comments: No step-off noted with palpation of spine.  No tenderness of the muscles of the back.    No tenderness to palpation of the sole of the right foot or heel area.  Skin:    General: Skin is warm and dry.     Findings: No rash.  Neurological:  Mental Status: She is alert.     Sensory: No sensory deficit.     Deep Tendon Reflexes: Reflexes are normal and symmetric.     Comments: 5/5 strength in entire lower extremities bilaterally. No sensation deficit. Patient is ambulatory without right foot drop.      ED Results / Procedures / Treatments   Labs (all labs ordered are listed, but only abnormal results are displayed) Labs Reviewed - No data to display  EKG None  Radiology No results found.  Procedures Procedures (including critical care time)  Medications Ordered in ED Medications  dexamethasone (DECADRON) injection 10 mg (has no administration in time range)  ketorolac (TORADOL) 15 MG/ML injection 15 mg (has no administration in time range)  oxyCODONE (Oxy IR/ROXICODONE) immediate release tablet 5 mg (has no administration in time range)    ED Course  I have reviewed the triage vital signs and the nursing notes.  Pertinent labs & imaging results that were available during my care of the patient were reviewed by me and considered in my medical decision making (see chart for details).  Patient seen and examined. Medications ordered.  Discussed steroid use in setting of diabetes.  Patient states that she takes oral medications only and she does not check blood sugars routinely stating "my doctor did not even give me a meter".  She requests a dose of IM steroids.  Will place on anti-inflammatories, however patient seems skeptical that these will work.  She is asking for a dose of Percocet here.  Discussed that I would not be able to prescribe these for home.  She tells me explicitly that she is not driving today after I inquired.  [place] substance reporting database reviewed.  Vital signs reviewed and are as follows: Vitals:   09/25/20 1058  BP: 123/77  Pulse: 84  Resp: 14  Temp: 98.2 F (36.8 C)  SpO2: 97%    No red flag s/s of low back pain. Patient was counseled on back pain precautions and told to do activity as tolerated but do not lift, push, or pull heavy objects more than 10 pounds for the next week.  Patient counseled to use ice or heat on back for no longer than 15 minutes every hour.   Patient counseled on proper use of muscle relaxant medication.  They were told not to drink alcohol, drive any vehicle, or do any dangerous activities while taking this medication.  Patient verbalized understanding.  Patient urged to follow-up with PCP if pain does not improve with treatment and rest or if pain becomes recurrent. Urged to return with worsening severe pain, loss of bowel or bladder control, trouble walking.   The patient verbalizes understanding and agrees with the plan.    MDM Rules/Calculators/A&P                          Patient with back pain with radicular features. No neurological deficits. Patient is ambulatory. No warning symptoms of back pain including: fecal incontinence, urinary retention or overflow incontinence, night sweats, waking from sleep with back pain, unexplained fevers or weight loss, h/o cancer, IVDU, recent trauma. No concern for cauda equina, epidural abscess, or other serious cause of back pain. Conservative measures such as rest, ice/heat and pain medicine indicated with PCP follow-up if no improvement with conservative management.   Final Clinical Impression(s) / ED Diagnoses Final diagnoses:  Lumbosacral radiculopathy    Rx / DC Orders ED Discharge Orders  Ordered    meloxicam (MOBIC) 7.5 MG tablet  Daily        09/25/20 1208    methocarbamol (ROBAXIN) 500 MG tablet  4 times daily        09/25/20 1208           Carlisle Cater, PA-C 09/25/20 1215      Charlesetta Shanks, MD 09/28/20 1553

## 2020-09-25 NOTE — Progress Notes (Addendum)
Pt reports white/yellow vaginal discharge with itching and odor. Self swab obtained. Pt advised she will be notified of results as well as treatment indicated if any via Mychart. She requested Rx for gel medication if treatment is indicated. Pt states she has had a problem with vaginal burning for quite some time. She states that she was told @ her last visit that she may need a referral to another doctor. I advised that I did not see any notation of referral. Since her last visit with a provider was 8 months ago, I advised that she will need a follow up so that a plan of care can be determined. She voiced understanding of all information and instructions given.    Chart reviewed for nurse visit. Agree with plan of care.   Virginia Rochester, NP 09/25/2020 3:30 PM

## 2020-09-25 NOTE — ED Triage Notes (Addendum)
Pt presents with low back pain radiating into her Right leg and pain in her heel every morning x2 weeks. Pt reports increase pain when laying down

## 2020-09-27 LAB — CERVICOVAGINAL ANCILLARY ONLY
Bacterial Vaginitis (gardnerella): POSITIVE — AB
Candida Glabrata: NEGATIVE
Candida Vaginitis: NEGATIVE
Chlamydia: NEGATIVE
Comment: NEGATIVE
Comment: NEGATIVE
Comment: NEGATIVE
Comment: NEGATIVE
Comment: NEGATIVE
Comment: NORMAL
Neisseria Gonorrhea: NEGATIVE
Trichomonas: NEGATIVE

## 2020-09-28 ENCOUNTER — Other Ambulatory Visit: Payer: Self-pay | Admitting: Nurse Practitioner

## 2020-09-28 MED ORDER — METRONIDAZOLE 0.75 % VA GEL: 1.0000 | Freq: Every day | VAGINAL | 0 refills | Status: AC

## 2020-10-02 ENCOUNTER — Other Ambulatory Visit: Payer: Self-pay | Admitting: Nurse Practitioner

## 2020-10-11 ENCOUNTER — Encounter: Payer: Self-pay | Admitting: Obstetrics and Gynecology

## 2020-10-11 ENCOUNTER — Ambulatory Visit (INDEPENDENT_AMBULATORY_CARE_PROVIDER_SITE_OTHER): Payer: Medicaid Other | Admitting: Obstetrics and Gynecology

## 2020-10-11 ENCOUNTER — Other Ambulatory Visit: Payer: Self-pay

## 2020-10-11 VITALS — BP 135/72 | HR 76 | Ht 66.0 in | Wt 210.0 lb

## 2020-10-11 DIAGNOSIS — N898 Other specified noninflammatory disorders of vagina: Secondary | ICD-10-CM | POA: Diagnosis not present

## 2020-10-11 MED ORDER — NYSTATIN-TRIAMCINOLONE 100000-0.1 UNIT/GM-% EX OINT: 1.0000 "application " | TOPICAL_OINTMENT | Freq: Two times a day (BID) | CUTANEOUS | 0 refills | Status: DC

## 2020-10-11 MED ORDER — FLUCONAZOLE 150 MG PO TABS: ORAL_TABLET | ORAL | 0 refills | Status: DC

## 2020-10-11 MED ORDER — METRONIDAZOLE 0.75 % VA GEL: 1.0000 | Freq: Every day | VAGINAL | 1 refills | Status: DC

## 2020-10-11 NOTE — Progress Notes (Signed)
Obstetrics and Gynecology Visit Return Patient Evaluation  Appointment Date: 10/11/2020  Primary Care Provider: [facility] [place] [facility]: [facility]  Chief Complaint: vaginal irritation  History of Present Illness:  Tanya Austin is a 49 y.o. with above CC. Patient came to clinic on 10/27 for self swab for s/s and had BV when metrogel was sent in on 10/30. She states she just finished this and states s/s helped some by this. She states she also has vulvar irritation too which has been ongoing for months. No itching. Menses currently ongoing. Pap neg 2020  Review of Systems: as noted in the History of Present Illness.  Patient Active Problem List   Diagnosis Date Noted  . History of COVID-19 09/10/2020  . Cough 09/10/2020  . Vaginal candidiasis 02/23/2017  . Bacterial vaginosis 08/04/2016   Medications:  Tanya Austin had no medications administered during this visit. Current Outpatient Medications  Medication Sig Dispense Refill  . Cholecalciferol (VITAMIN D3) 3000 units TABS Take 1 capsule by mouth daily.     . fish oil-omega-3 fatty acids 1000 MG capsule Take 1 g by mouth daily.    . IRON PO Take 1 tablet by mouth daily. otc medication- not sure of dose    . pioglitazone (ACTOS) 30 MG tablet Take 30 mg by mouth daily.  3  . Probiotic Product (PROBIOTIC DAILY PO) Take 1 capsule by mouth daily.    . RESTASIS 0.05 % ophthalmic emulsion 1 drop 2 (two) times daily.    . simvastatin (ZOCOR) 20 MG tablet Take 20 mg by mouth at bedtime.     . sitaGLIPtin-metformin (JANUMET) 50-1000 MG per tablet Take 1 tablet by mouth 2 (two) times daily with a meal.    . valACYclovir (VALTREX) 1000 MG tablet Take 1 tablet daily with no outbreak. Take 1 tablet BID with an outbreak 90 tablet 12  . acetaminophen (TYLENOL) 500 MG tablet Take 1,000 mg by mouth 2 (two) times daily as needed for moderate pain.    . B-D UF III MINI PEN NEEDLES 31G X 5 MM MISC  SMARTSIG:SUB-Q 1 to 2 Times Daily (Patient not taking: Reported on 09/25/2020)    . hydrOXYzine (VISTARIL) 25 MG capsule SMARTSIG:1-2 Capsule(s) By Mouth Every Evening (Patient not taking: Reported on 09/25/2020)    . lidocaine (XYLOCAINE) 2 % jelly Apply 1 application topically as needed. (Patient not taking: Reported on 09/25/2020) 30 mL 0  . meloxicam (MOBIC) 7.5 MG tablet Take 1 tablet (7.5 mg total) by mouth daily. (Patient not taking: Reported on 09/25/2020) 10 tablet 0  . methocarbamol (ROBAXIN) 500 MG tablet Take 1 tablet (500 mg total) by mouth 4 (four) times daily. (Patient not taking: Reported on 09/25/2020) 20 tablet 0  . pantoprazole (PROTONIX) 40 MG tablet Take 40 mg by mouth daily. (Patient not taking: Reported on 09/25/2020)    . PROAIR HFA 108 (90 Base) MCG/ACT inhaler INHALE 2 PUFFS INTO THE LUNGS EVERY 6 HOURS AS NEEDED FOR WHEEZING OR SHORTNESS OF BREATH (Patient not taking: Reported on 10/11/2020) 8.5 g 0   No current facility-administered medications for this visit.    Allergies: has No Known Allergies.  Physical Exam:  BP 135/72   Pulse 76   Ht 5\' 6"  (1.676 m)   Wt 210 lb (95.3 kg)   BMI 33.89 kg/m  Body mass index is 33.89 kg/m. General appearance: Well nourished, well developed female in no acute distress.  Abdomen: diffusely non tender to  palpation, non distended, and no masses, hernias Neuro/Psych:  Normal mood and affect.    Pelvic exam:  EGBUS, vaginal vault and cervix: within normal limits. Old blood in vault   Assessment: pt stable  Plan:  1. Vaginal irritation Recommend doing metrogel this time with diflucan x 2. After that will do mycolog ointment until RTC in 4-6 weeks  Tanya Romans MD Attending Center for [facility])

## 2020-10-12 ENCOUNTER — Encounter: Payer: Self-pay | Admitting: Obstetrics and Gynecology

## 2020-10-22 ENCOUNTER — Telehealth: Payer: Self-pay | Admitting: Obstetrics and Gynecology

## 2020-10-22 NOTE — Telephone Encounter (Signed)
Patient stated she needed another Rx. Her medicaid does not cover the Rx Dr. Ilda Basset gave her.

## 2020-10-22 NOTE — Telephone Encounter (Signed)
Attempted to return patients call. Patient did not answer. Mailbox is full and not able to leave a message to find out which medication patient could not get refilled. Will send My Chart message.

## 2020-10-23 ENCOUNTER — Other Ambulatory Visit: Payer: Self-pay | Admitting: Lactation Services

## 2020-10-23 MED ORDER — TRIAMCINOLONE ACETONIDE 0.5 % EX OINT: 1.0000 "application " | TOPICAL_OINTMENT | Freq: Two times a day (BID) | CUTANEOUS | 0 refills | Status: DC

## 2020-10-23 MED ORDER — NYSTATIN 100000 UNIT/GM EX CREA: 1.0000 "application " | TOPICAL_CREAM | Freq: Two times a day (BID) | CUTANEOUS | 0 refills | Status: DC

## 2020-10-23 NOTE — Progress Notes (Signed)
Nystatin and Triamcinolone ordered separately for patient.

## 2020-12-30 ENCOUNTER — Ambulatory Visit (INDEPENDENT_AMBULATORY_CARE_PROVIDER_SITE_OTHER): Payer: Medicaid Other | Admitting: Obstetrics and Gynecology

## 2020-12-30 ENCOUNTER — Encounter: Payer: Self-pay | Admitting: Obstetrics and Gynecology

## 2020-12-30 ENCOUNTER — Other Ambulatory Visit (HOSPITAL_COMMUNITY)
Admission: RE | Admit: 2020-12-30 | Discharge: 2020-12-30 | Disposition: A | Discharge status: Home / Self Care | Payer: Medicaid Other | Source: Ambulatory Visit | Attending: Obstetrics and Gynecology | Admitting: Obstetrics and Gynecology

## 2020-12-30 ENCOUNTER — Other Ambulatory Visit: Payer: Self-pay

## 2020-12-30 VITALS — BP 129/87 | HR 79 | Ht 67.0 in | Wt 209.9 lb

## 2020-12-30 DIAGNOSIS — N898 Other specified noninflammatory disorders of vagina: Secondary | ICD-10-CM

## 2020-12-30 DIAGNOSIS — N939 Abnormal uterine and vaginal bleeding, unspecified: Secondary | ICD-10-CM

## 2020-12-30 DIAGNOSIS — N889 Noninflammatory disorder of cervix uteri, unspecified: Secondary | ICD-10-CM | POA: Diagnosis present

## 2020-12-30 DIAGNOSIS — N888 Other specified noninflammatory disorders of cervix uteri: Secondary | ICD-10-CM | POA: Insufficient documentation

## 2020-12-30 DIAGNOSIS — Z308 Encounter for other contraceptive management: Secondary | ICD-10-CM

## 2020-12-30 NOTE — Progress Notes (Signed)
Obstetrics and Gynecology Established Patient Evaluation Follow Up  Appointment Date: 12/30/2020  OBGYN Clinic: [facility]   Primary Care Provider: Lucianne Lei  Chief Complaint:  Chief Complaint  Patient presents with  . Gynecologic Exam    History of Present Illness: Tanya Austin is a 50 y.o. African-American [id number] (Patient's last menstrual period was 12/04/2020 (exact date).), seen for the above chief complaint. Her past medical history is significant for chronic vaginitis, HTN, DM2   Patient last seen by me in 09/2020 as a new patient for vaginal irritation. Self swab a few weeks prior showed BV (gardnerella) with neg gc/ct/trich and yeast and another provider had sent in metrogel.  When I saw her, she was still having vaginal irritation and exam only notable for old blood in the vault.  I recommended doing metrogel and diflucan x 2 and then mycolog 2 ointment until I saw her back in 4-6 weeks.   She states that the she did all of the above but that the steroid and antifungal, which had to be sent in separately due to insurance made her more irritated. She believes one was an ointment and one was a cream but I sent in both to be ointments.   Currently, she states she is not using any vaginal treatments, and she is having burning and itching which is internal and her s/s are always internal. She does not have any discharge or dysuria and hasn't been sexually active for the past six months.  She has had some spotting since last week and she is currently on her placebo week of her tri phasic pills, which she uses for birth control and not to help with her periods. She uses tampons  Last year she states she used the boric acid for a few months in the middle of the year with no help.   Review of Systems: Pertinent items are noted in HPI.    Past Medical History:  Past Medical History:  Diagnosis Date  . Diabetes mellitus   . Fibroid   .  Headache   . Herpes   . High cholesterol   . History of COVID-19 09/10/2020  . Hx of gonorrhea 2017    Past Surgical History:  Past Surgical History:  Procedure Laterality Date  . CERVICAL BIOPSY      Past Obstetrical History:  OB History  Gravida Para Term Preterm AB Living  4 4 4  0 0 4  SAB IAB Ectopic Multiple Live Births  0 0 0 0 4    # Outcome Date GA Lbr Len/2nd Weight Sex Delivery Anes PTL Lv  4 Term      Vag-Spont     3 Term      Vag-Spont     2 Term      Vag-Spont     1 Term      Vag-Spont       Past Gynecological History: As per HPI. Periods: qmonth, regular History of Pap Smear(s): Yes.   Last pap 2020, which was negative History of STI(s): Yes.   She is currently using oral contraceptives (estrogen/progesterone) for contraception.   Social History:  Social History   Socioeconomic History  . Marital status: Single    Spouse name: Not on file  . Number of children: Not on file  . Years of education: Not on file  . Highest education level: Not on file  Occupational History  . Not on file  Tobacco Use  . Smoking  status: Former Smoker    Types: Cigarettes    Quit date: 06/09/2000    Years since quitting: 20.5  . Smokeless tobacco: Never Used  Vaping Use  . Vaping Use: Never used  Substance and Sexual Activity  . Alcohol use: No    Comment: Socially on weekends  . Drug use: No    Types: Marijuana  . Sexual activity: Yes    Birth control/protection: Pill    Comment: Bertram Millard  Other Topics Concern  . Not on file  Social History Narrative  . Not on file   Social Determinants of Health   Financial Resource Strain: Not on file  Food Insecurity: Not on file  Transportation Needs: Not on file  Physical Activity: Not on file  Stress: Not on file  Social Connections: Not on file  Intimate Partner Violence: Not on file    Family History:  Family History  Problem Relation Age of Onset  . Hypertension Other   . Hyperlipidemia Other   . Heart  disease Other   . Hypertension Mother   . Diabetes Mother   . Hypertension Father     Medications Tanya Austin had no medications administered during this visit. Current Outpatient Medications  Medication Sig Dispense Refill  . acetaminophen (TYLENOL) 500 MG tablet Take 1,000 mg by mouth 2 (two) times daily as needed for moderate pain.    . Cholecalciferol (VITAMIN D3) 3000 units TABS Take 1 capsule by mouth daily.     . fish oil-omega-3 fatty acids 1000 MG capsule Take 1 g by mouth daily.    . IRON PO Take 1 tablet by mouth daily. otc medication- not sure of dose    . nystatin-triamcinolone ointment (MYCOLOG) Apply 1 application topically 2 (two) times daily. Start this after you finish the metrogel and use it until I see you back in 4-6 weeks in the gyn clinic 30 g 0  . pioglitazone (ACTOS) 30 MG tablet Take 30 mg by mouth daily.  3  . Probiotic Product (PROBIOTIC DAILY PO) Take 1 capsule by mouth daily.    . RESTASIS 0.05 % ophthalmic emulsion 1 drop 2 (two) times daily.    . simvastatin (ZOCOR) 20 MG tablet Take 20 mg by mouth at bedtime.     . sitaGLIPtin-metformin (JANUMET) 50-1000 MG per tablet Take 1 tablet by mouth 2 (two) times daily with a meal.    . valACYclovir (VALTREX) 1000 MG tablet Take 1 tablet daily with no outbreak. Take 1 tablet BID with an outbreak 90 tablet 12  . B-D UF III MINI PEN NEEDLES 31G X 5 MM MISC SMARTSIG:SUB-Q 1 to 2 Times Daily (Patient not taking: Reported on 09/25/2020)    . metroNIDAZOLE (METROGEL) 0.75 % vaginal gel Place 1 Applicatorful vaginally at bedtime. Apply one applicatorful to vagina at bedtime for 5 days 70 g 1   No current facility-administered medications for this visit.    Allergies Patient has no known allergies.   Physical Exam:  BP 129/87   Pulse 79   Ht 5\' 7"  (1.702 m)   Wt 209 lb 14.4 oz (95.2 kg)   LMP 12/04/2020 (Exact Date)   BMI 32.87 kg/m  Body mass index is 32.87 kg/m. General appearance: Well nourished, well  developed female in no acute distress.   Cardiovascular: normal s1 and s2.  No murmurs, rubs or gallops. Respiratory:  Clear to auscultation bilateral. Normal respiratory effort Abdomen: positive bowel sounds and no masses, hernias; diffusely non tender to palpation,  non distended Neuro/Psych:  Normal mood and affect.  Skin:  Warm and dry.  Lymphatic:  No inguinal lymphadenopathy.   Pelvic exam: is not limited by body habitus EGBUS: within normal limits Vagina: within normal limits and white-yellow d/c in vault Cervix: normal appearing except friable at the TZ and at the 7 o'clock region with what looks like a newly forming nabothian cyst (subcm yellow-white dome shaped lesion) and friable stroma (hyperemic) or a  Uterus:  nonenlarged and non tender Adnexa:  normal adnexa and no mass, fullness, tenderness Rectovaginal: deferred  Laboratory: none  Radiology: none  Assessment: pt stable  Plan:  1. Vaginal irritation D/w her that recommend swab and pap smear and starting over after results are back  I also told her I recommend switching from tampons to pads given her friable cervix - NuSwab VG+, Candida 6sp  2. Vaginal spotting  3. Friable cervix - NuSwab VG+, Candida 6sp - Cytology - PAP( [place])  4. Lesion of cervix - NuSwab VG+, Candida 6sp - Cytology - PAP( [place])  5. GYN I also told her I recommend stopping the OCPs given her co-morbidities; she uses the OCPs for birth control and not period control. Other options d/w her and will follow up with her re: this after swab results are back.     RTC call patient after labs are back   Durene Romans MD Attending Center for [facility])

## 2021-01-01 LAB — NUSWAB VG+, CANDIDA 6SP
Atopobium vaginae: HIGH Score — AB
BVAB 2: HIGH Score — AB
C PARAPSILOSIS/TROPICALIS: NEGATIVE
Candida albicans, NAA: NEGATIVE
Candida glabrata, NAA: NEGATIVE
Candida krusei, NAA: NEGATIVE
Candida lusitaniae, NAA: NEGATIVE
Chlamydia trachomatis, NAA: NEGATIVE
Megasphaera 1: HIGH Score — AB
Neisseria gonorrhoeae, NAA: NEGATIVE
Trich vag by NAA: NEGATIVE

## 2021-01-06 ENCOUNTER — Telehealth: Payer: Self-pay | Admitting: Obstetrics and Gynecology

## 2021-01-06 ENCOUNTER — Other Ambulatory Visit: Payer: Self-pay

## 2021-01-06 ENCOUNTER — Encounter (HOSPITAL_COMMUNITY): Payer: Self-pay

## 2021-01-06 ENCOUNTER — Ambulatory Visit (HOSPITAL_COMMUNITY)
Admission: EM | Admit: 2021-01-06 | Discharge: 2021-01-06 | Disposition: A | Discharge status: Home / Self Care | Payer: Medicaid Other | Attending: Family Medicine | Admitting: Family Medicine

## 2021-01-06 DIAGNOSIS — J3089 Other allergic rhinitis: Secondary | ICD-10-CM

## 2021-01-06 DIAGNOSIS — J0110 Acute frontal sinusitis, unspecified: Secondary | ICD-10-CM

## 2021-01-06 LAB — CYTOLOGY - PAP
Comment: NEGATIVE
Diagnosis: UNDETERMINED — AB
High risk HPV: NEGATIVE

## 2021-01-06 LAB — CBG MONITORING, ED: Glucose-Capillary: 116 mg/dL — ABNORMAL HIGH (ref 70–99)

## 2021-01-06 MED ORDER — CETIRIZINE HCL 10 MG PO TABS: 10.0000 mg | ORAL_TABLET | Freq: Every day | ORAL | 2 refills | Status: DC

## 2021-01-06 MED ORDER — METRONIDAZOLE 500 MG PO TABS: 500.0000 mg | ORAL_TABLET | Freq: Two times a day (BID) | ORAL | 0 refills | Status: AC

## 2021-01-06 MED ORDER — FLUTICASONE PROPIONATE 50 MCG/ACT NA SUSP: 1.0000 | Freq: Two times a day (BID) | NASAL | 2 refills | Status: DC

## 2021-01-06 MED ORDER — PREDNISONE 20 MG PO TABS: 40.0000 mg | ORAL_TABLET | Freq: Every day | ORAL | 0 refills | Status: DC

## 2021-01-06 MED ORDER — BORIC ACID CRYS: 600.0000 mg | CRYSTALS | Freq: Every day | 5 refills | Status: AC

## 2021-01-06 MED ORDER — AMOXICILLIN-POT CLAVULANATE 875-125 MG PO TABS: 1.0000 | ORAL_TABLET | Freq: Two times a day (BID) | ORAL | 0 refills | Status: DC

## 2021-01-06 MED ORDER — METRONIDAZOLE 0.75 % VA GEL: 1.0000 | VAGINAL | 2 refills | Status: AC

## 2021-01-06 NOTE — Telephone Encounter (Signed)
GYN Telephone Note  D/w her re: results and patient is amenable to po flagyl then BA x 21 days and then metrogel x 4-6 months. D/w her re: keeping BA safe and away from other PO meds, kids, pets, etc  I also recommend trying an urgent care for her s/s since she is not satisfied with her care from her PCP  Durene Romans MD Attending Center for [place] (Faculty Practice) 01/06/2021 Time: [phone]

## 2021-01-06 NOTE — ED Provider Notes (Signed)
[place]    CSN: [id number] Arrival date & time: 01/06/21  1316      History   Chief Complaint Chief Complaint  Patient presents with  . Headache  . Nausea    HPI Tanya Austin is a 50 y.o. female.   Here today with over a week of severe frontal headache, posterior headache radiating to neck muscles, congestion, ear pain b/l, ringing in ears, dizziness (resolved last week). Denies CP, SOB, cough, visual changes, N/V/D. Tried some sudafed and OTC pain relievers with mild temporary relief. Hx of allergic rhinitis not currently on anything for this. States had COVID a few months ago and this does not feel like COVID. Saw PCP last week for this issue and states was not diagnosed with anything or treated with anything, just referred to ENT which is not until end of month.      Past Medical History:  Diagnosis Date  . Diabetes mellitus   . Fibroid   . Headache   . Herpes   . High cholesterol   . History of COVID-19 09/10/2020  . Hx of gonorrhea 2017    Patient Active Problem List   Diagnosis Date Noted  . Vaginal candidiasis 02/23/2017  . Bacterial vaginosis 08/04/2016    Past Surgical History:  Procedure Laterality Date  . CERVICAL BIOPSY      OB History    Gravida  4   Para  4   Term  4   Preterm  0   AB  0   Living  4     SAB  0   IAB  0   Ectopic  0   Multiple  0   Live Births  4            Home Medications    Prior to Admission medications   Medication Sig Start Date End Date Taking? Authorizing Provider  amoxicillin-clavulanate (AUGMENTIN) 875-125 MG tablet Take 1 tablet by mouth every 12 (twelve) hours. 01/06/21  Yes Volney American, PA-C  cetirizine (ZYRTEC ALLERGY) 10 MG tablet Take 1 tablet (10 mg total) by mouth daily. 01/06/21  Yes Volney American, PA-C  fluticasone [facility]) 50 MCG/ACT nasal spray Place 1 spray into both nostrils in the morning and at bedtime. 01/06/21  Yes Volney American, PA-C   pioglitazone (ACTOS) 30 MG tablet Take 30 mg by mouth daily. 02/21/16  Yes [provider]  predniSONE (DELTASONE) 20 MG tablet Take 2 tablets (40 mg total) by mouth daily with breakfast. 01/06/21  Yes Volney American, PA-C  simvastatin (ZOCOR) 20 MG tablet Take 20 mg by mouth at bedtime.    Yes [provider]  sitaGLIPtin-metformin (JANUMET) 50-1000 MG per tablet Take 1 tablet by mouth 2 (two) times daily with a meal.   Yes [provider]  valACYclovir (VALTREX) 1000 MG tablet Take 1 tablet daily with no outbreak. Take 1 tablet BID with an outbreak 12/29/19  Yes Dove, Myra C, MD  acetaminophen (TYLENOL) 500 MG tablet Take 1,000 mg by mouth 2 (two) times daily as needed for moderate pain.    [provider]  B-D UF III MINI PEN NEEDLES 31G X 5 MM MISC SMARTSIG:SUB-Q 1 to 2 Times Daily Patient not taking: Reported on 09/25/2020 05/14/20   [provider]  Boric Acid CRYS Place 600 mg vaginally at bedtime for 21 days. Take this second 01/06/21 01/27/21  Aletha Halim, MD  Cholecalciferol (VITAMIN D3) 3000 units TABS Take 1  capsule by mouth daily.     [provider]  fish oil-omega-3 fatty acids 1000 MG capsule Take 1 g by mouth daily.    [provider]  IRON PO Take 1 tablet by mouth daily. otc medication- not sure of dose    [provider]  metroNIDAZOLE (FLAGYL) 500 MG tablet Take 1 tablet (500 mg total) by mouth 2 (two) times daily for 7 days. Take this first 01/06/21 01/13/21  Aletha Halim, MD  metroNIDAZOLE (METROGEL) 0.75 % vaginal gel Place 1 Applicatorful vaginally 2 (two) times a week for 40 doses. Take this third for 4-6 months 01/06/21 05/23/21  Aletha Halim, MD  nystatin-triamcinolone ointment [facility]) Apply 1 application topically 2 (two) times daily. Start this after you finish the metrogel and use it until I see you back in 4-6 weeks in the gyn clinic 10/11/20   Aletha Halim, MD  Probiotic Product  (PROBIOTIC DAILY PO) Take 1 capsule by mouth daily.    [provider]  RESTASIS 0.05 % ophthalmic emulsion 1 drop 2 (two) times daily. 09/05/20   [provider]  Insulin Glargine (TOUJEO SOLOSTAR) 300 UNIT/ML SOPN Inject 14 Units into the skin at bedtime. Patient not taking: Reported on 09/10/2020  09/25/20  [provider]  promethazine (PHENERGAN) 25 MG tablet Take 1 tablet (25 mg total) by mouth every 6 (six) hours as needed for nausea or vomiting. Patient not taking: Reported on 09/10/2020 10/24/17 09/25/20  Gareth Morgan, MD    Family History Family History  Problem Relation Age of Onset  . Hypertension Other   . Hyperlipidemia Other   . Heart disease Other   . Hypertension Mother   . Diabetes Mother   . Hypertension Father     Social History Social History   Tobacco Use  . Smoking status: Former Smoker    Types: Cigarettes    Quit date: 06/09/2000    Years since quitting: 20.5  . Smokeless tobacco: Never Used  Vaping Use  . Vaping Use: Never used  Substance Use Topics  . Alcohol use: No    Comment: Socially on weekends  . Drug use: No    Types: Marijuana     Allergies   Patient has no known allergies.   Review of Systems Review of Systems PER HPI    Physical Exam Triage Vital Signs ED Triage Vitals  Enc Vitals Group     BP 01/06/21 1455 (!) 156/98     Pulse Rate 01/06/21 1455 71     Resp 01/06/21 1455 18     Temp 01/06/21 1455 98.6 F (37 C)     Temp Source 01/06/21 1455 Oral     SpO2 01/06/21 1455 98 %     Weight --      Height --      Head Circumference --      Peak Flow --      Pain Score 01/06/21 1453 10     Pain Loc --      Pain Edu? --      Excl. in [place]? --    No data found.  Updated Vital Signs BP (!) 163/83 (BP Location: Right Arm) Comment: re-eval  Pulse 71   Temp 98.6 F (37 C) (Oral)   Resp 18   LMP 01/01/2021   SpO2 98%   Visual Acuity Right Eye Distance:   Left Eye Distance:   Bilateral  Distance:    Right Eye Near:   Left Eye Near:  Bilateral Near:     Physical Exam Vitals and nursing note reviewed.  Constitutional:      Appearance: Normal appearance. She is not ill-appearing.  HENT:     Head: Atraumatic.     Ears:     Comments: B/l frontal and maxillary sinuses ttp B/l middle ear effusion    Nose: Congestion present.     Mouth/Throat:     Mouth: Mucous membranes are moist.     Pharynx: Posterior oropharyngeal erythema present. No oropharyngeal exudate.  Eyes:     Extraocular Movements: Extraocular movements intact.     Conjunctiva/sclera: Conjunctivae normal.  Cardiovascular:     Rate and Rhythm: Normal rate and regular rhythm.     Heart sounds: Normal heart sounds.  Pulmonary:     Effort: Pulmonary effort is normal.     Breath sounds: Normal breath sounds. No wheezing or rales.  Abdominal:     General: Bowel sounds are normal. There is no distension.     Palpations: Abdomen is soft.     Tenderness: There is no abdominal tenderness. There is no guarding.  Musculoskeletal:        General: Normal range of motion.     Cervical back: Normal range of motion and neck supple.  Lymphadenopathy:     Cervical: Cervical adenopathy (mild diffuse b/l) present.  Skin:    General: Skin is warm and dry.  Neurological:     General: No focal deficit present.     Mental Status: She is alert and oriented to person, place, and time.     Cranial Nerves: No cranial nerve deficit.     Motor: No weakness.  Psychiatric:        Mood and Affect: Mood normal.        Thought Content: Thought content normal.        Judgment: Judgment normal.      UC Treatments / Results  Labs (all labs ordered are listed, but only abnormal results are displayed) Labs Reviewed  CBG MONITORING, ED    EKG   Radiology No results found.  Procedures Procedures (including critical care time)  Medications Ordered in UC Medications - No data to display  Initial Impression /  Assessment and Plan / UC Course  I have reviewed the triage vital signs and the nursing notes.  Pertinent labs & imaging results that were available during my care of the patient were reviewed by me and considered in my medical decision making (see chart for details).     Suspect constellation of sxs related to poorly controlled allergies developing into sinusitis, but possibly viral illness exacerbating this instead. She declines COVID testing today. No neurologic deficit noted on exam, vital signs stable aside from elevated BP which she follows with PCP for. Will treat for sinusitis and start daily allergy regimen, discussed with patient prednisone will cause BS to elevate and to keep an eye on it if becoming significantly out of range. F/u with PCP for recheck in 1 week. Work note given. Return for acutely worsening sxs.   Final Clinical Impressions(s) / UC Diagnoses   Final diagnoses:  Acute non-recurrent frontal sinusitis  Seasonal allergic rhinitis due to other allergic trigger   Discharge Instructions   None    ED Prescriptions    Medication Sig Dispense Auth. Provider   amoxicillin-clavulanate (AUGMENTIN) 875-125 MG tablet Take 1 tablet by mouth every 12 (twelve) hours. 14 tablet Volney American, PA-C   fluticasone [facility]) 50 MCG/ACT nasal spray Place  1 spray into both nostrils in the morning and at bedtime. 16 g Volney American, [place]   cetirizine (ZYRTEC ALLERGY) 10 MG tablet Take 1 tablet (10 mg total) by mouth daily. 30 tablet Volney American, [place]   predniSONE (DELTASONE) 20 MG tablet Take 2 tablets (40 mg total) by mouth daily with breakfast. 10 tablet Volney American, [place]     PDMP not reviewed this encounter.   Volney American, [place] 01/06/21 [id number]

## 2021-01-06 NOTE — ED Triage Notes (Addendum)
Pt c/o head to entire head, more pain to posterior area and neck, frontal area; also c/o dental pain, nausea for past week. Pt states pain worse upon waking. Reports dizziness for a couple days, resolved since Thursday. Reports when dizziness occurred  States she had some left ear pressure and took OTC sudafed before current constellation of symptoms began; states she has ringing in both ears.  Had eval with her PCP last week for same and no Rx or tx given. Pt reports her BP was elevated (new) at [facility] office Friday.  Denies SOB, CP, change in vision, extremity weakness, change to speech, congestion, cough, fever.  Took BC this morning w/o improvement to headache. Requesting CBG check.

## 2021-01-06 NOTE — ED Notes (Signed)
CBG 116  

## 2021-02-03 ENCOUNTER — Encounter: Payer: Self-pay | Admitting: *Deleted

## 2021-02-05 ENCOUNTER — Other Ambulatory Visit: Payer: Self-pay | Admitting: Obstetrics and Gynecology

## 2021-02-05 MED ORDER — VALACYCLOVIR HCL 500 MG PO TABS: ORAL_TABLET | ORAL | 3 refills | Status: DC

## 2021-02-05 MED ORDER — BORIC ACID CRYS: 600.0000 mg | CRYSTALS | Freq: Every day | 0 refills | Status: AC

## 2021-04-09 ENCOUNTER — Other Ambulatory Visit: Payer: Self-pay | Admitting: Family Medicine

## 2021-04-24 ENCOUNTER — Encounter: Payer: Self-pay | Admitting: Lactation Services

## 2021-04-24 ENCOUNTER — Other Ambulatory Visit: Payer: Self-pay | Admitting: Lactation Services

## 2021-04-24 ENCOUNTER — Ambulatory Visit: Payer: Medicaid Other | Admitting: Obstetrics and Gynecology

## 2021-04-24 MED ORDER — LIDOCAINE 5 % EX OINT
1.0000 | TOPICAL_OINTMENT | CUTANEOUS | 1 refills | Status: DC | PRN
Start: 2021-04-24 — End: 2021-11-14

## 2021-04-25 ENCOUNTER — Other Ambulatory Visit: Payer: Self-pay | Admitting: Lactation Services

## 2021-04-25 MED ORDER — METRONIDAZOLE 1 % EX GEL: Freq: Every day | CUTANEOUS | 0 refills | Status: DC

## 2021-04-25 MED ORDER — METRONIDAZOLE 500 MG PO TABS: 500.0000 mg | ORAL_TABLET | Freq: Two times a day (BID) | ORAL | 0 refills | Status: DC

## 2021-04-25 NOTE — Progress Notes (Signed)
Metrogel sent to pharmacy at patients request.

## 2021-05-28 ENCOUNTER — Other Ambulatory Visit: Payer: Self-pay | Admitting: Medical

## 2021-05-28 ENCOUNTER — Other Ambulatory Visit: Payer: Self-pay | Admitting: *Deleted

## 2021-05-28 DIAGNOSIS — N898 Other specified noninflammatory disorders of vagina: Secondary | ICD-10-CM

## 2021-05-28 MED ORDER — METRONIDAZOLE 500 MG PO TABS: 500.0000 mg | ORAL_TABLET | Freq: Two times a day (BID) | ORAL | 0 refills | Status: DC

## 2021-05-28 NOTE — Telephone Encounter (Signed)
Patient needs to contact office first.

## 2021-07-09 ENCOUNTER — Encounter (INDEPENDENT_AMBULATORY_CARE_PROVIDER_SITE_OTHER): Payer: Medicaid Other | Admitting: Ophthalmology

## 2021-08-26 ENCOUNTER — Encounter: Payer: Self-pay | Admitting: Internal Medicine

## 2021-08-26 ENCOUNTER — Telehealth: Payer: Self-pay

## 2021-08-26 ENCOUNTER — Other Ambulatory Visit: Payer: Self-pay

## 2021-08-26 ENCOUNTER — Ambulatory Visit (INDEPENDENT_AMBULATORY_CARE_PROVIDER_SITE_OTHER): Payer: Medicaid Other | Admitting: Internal Medicine

## 2021-08-26 VITALS — BP 117/68 | HR 71 | Temp 97.6°F | Ht 67.0 in | Wt 210.7 lb

## 2021-08-26 DIAGNOSIS — M722 Plantar fascial fibromatosis: Secondary | ICD-10-CM | POA: Diagnosis not present

## 2021-08-26 DIAGNOSIS — E1169 Type 2 diabetes mellitus with other specified complication: Secondary | ICD-10-CM | POA: Insufficient documentation

## 2021-08-26 DIAGNOSIS — E119 Type 2 diabetes mellitus without complications: Secondary | ICD-10-CM | POA: Diagnosis not present

## 2021-08-26 DIAGNOSIS — Z0000 Encounter for general adult medical examination without abnormal findings: Secondary | ICD-10-CM

## 2021-08-26 DIAGNOSIS — E785 Hyperlipidemia, unspecified: Secondary | ICD-10-CM | POA: Insufficient documentation

## 2021-08-26 LAB — POCT GLYCOSYLATED HEMOGLOBIN (HGB A1C): Hemoglobin A1C: 6.1 % — AB (ref 4.0–5.6)

## 2021-08-26 LAB — GLUCOSE, CAPILLARY: Glucose-Capillary: 111 mg/dL — ABNORMAL HIGH (ref 70–99)

## 2021-08-26 MED ORDER — METFORMIN HCL 1000 MG PO TABS: 1000.0000 mg | ORAL_TABLET | Freq: Two times a day (BID) | ORAL | 3 refills | Status: DC

## 2021-08-26 MED ORDER — TRULICITY 0.75 MG/0.5ML ~~LOC~~ SOAJ: 0.7500 mg | SUBCUTANEOUS | 2 refills | Status: DC

## 2021-08-26 MED ORDER — NAPROXEN 500 MG PO TABS: 500.0000 mg | ORAL_TABLET | Freq: Two times a day (BID) | ORAL | 0 refills | Status: AC

## 2021-08-26 NOTE — Assessment & Plan Note (Signed)
Currently taking simvastatin 20 mg and over-the-counter fish oil.  Will obtain lipid panel today, if LDL above 100 can consider increased dosing based off ASCVD risk.

## 2021-08-26 NOTE — Telephone Encounter (Signed)
PA came through on cover my meds  for pt  ( TRULICITY )  sent in with office notes from 08/26/21 .. awaiting approval or denial

## 2021-08-26 NOTE — Assessment & Plan Note (Addendum)
Patient has been on pioglitazone 30 mg and Janumet 50- 1000 mg twice daily for several years.  She reports that she used to check her blood sugars with fastings 80s to 90s and daytime in the 100s.  Denies any polyuria polydipsia, no numbness or tingling.  Denies any history of complications including ulcers.  She currently follows with Dr. Elder Negus for eye exams.  She has not had an eye exam since before COVID.  Advised her to schedule with their office for appointment.  Discussed with the patient that weight loss may help further improve glucose control and with weight loss we can con sitter discontinuing some of the medications in the future.  To help with weight loss we will start her on Trulicity 2.56 mg injection weekly continue metformin 1000 mg twice daily and keep her current dosing of pioglitazone.  We will follow-up in 3 months for A1c recheck  Patient instructed to contact PCP office and has old records sent to reports.

## 2021-08-26 NOTE — Assessment & Plan Note (Signed)
Patient reporting right heel pain constant aching soreness throughout the day worse in the morning improves as she walks.  This has been ongoing for several months.  She has pain with dorsiflexion.  She has not tried anything to help with this pain.  On physical exam she is tender palpation on the heel and has pain with dorsiflexion.  No edema or erythema noted, no pain with active movement.  No joint line tenderness.  Strength and sensation intact.  Will treat as Planter fasciitis.  Patient instructed on stretching exercises for fascia of foot to use ice water bottle or tennis ball and roll foot.  Also given prescription for naproxen if acute flare of pain.  If this does not improve we can consider referring her to a foot specialist in the future.

## 2021-08-26 NOTE — Patient Instructions (Addendum)
Dear Ms. Liwanag,  Thank you for trusting [place] with your care today.  Today we discussed your diabetes high cholesterol.  For your diabetes appears to be well controlled.  We will have you continue taking your Pioglitazone. To help with some weight loss and hopefully be able to decrease your diabetic medication in the future, we will discontinue your Janumet and instead start you on Trulicity 0.75mg  injections once weekly. We will also have you start taking metformin 1000mg  twice daily.  We can prescribe you a glucose monitor if you would like to check your blood sugar.  Please also call your eye doctor to schedule an appointment to have your eyes evaluated.   For the heel pain, this is likely plantar fasciitis. To help with this pain, we recommend controlling the pain with Tyelonol. Using a frozen water bottle or tennis ball to roll your foot along helps improve the inflammation and pain. NSAIDs can also be used. We will give you a short course of Naproxen to take for moderate to sever pain. If this doesn't help your pain, we can consider referring you to a foot specialist.   Please continue taking your statin.   Please call your old primary care doctor and have them send records to our office. Please return to the clinic in 3 months for a diabetes follow up.

## 2021-08-26 NOTE — Progress Notes (Signed)
CC: New to clinic to establish care  HPI:Tanya Austin is a 50 y.o. female who presents for evaluation of establish care. Please see individual problem based A/P for details.  Here to establish care reporting that she does not like her previous doctor because she felt as though her diabetes was not being properly managed.  She was told by her previous PCP that since she is not taking insulin she does not need a glucometer to monitor her blood sugar on a daily basis.  She last saw her primary care provider in February/March of this past year.  She also follows with a pain doctor at [facility].    Please see encounters tab for problem-based charting.  Problem List Items Addressed This Visit       Endocrine   Diabetes [facility])    Patient has been on pioglitazone 30 mg and Janumet 50- 1000 mg twice daily for several years.  She reports that she used to check her blood sugars with fastings 80s to 90s and daytime in the 100s.  Denies any polyuria polydipsia, no numbness or tingling.  Denies any history of complications including ulcers.  She currently follows with Dr. Elder Negus for eye exams.  She has not had an eye exam since before COVID.  Advised her to schedule with their office for appointment.  Discussed with the patient that weight loss may help further improve glucose control and with weight loss we can con sitter discontinuing some of the medications in the future.  To help with weight loss we will start her on Trulicity 8.24 mg injection weekly continue metformin 1000 mg twice daily and keep her current dosing of pioglitazone.  We will follow-up in 3 months for A1c recheck  Patient instructed to contact PCP office and has old records sent to reports.      Relevant Medications   metFORMIN (GLUCOPHAGE) 1000 MG tablet   Dulaglutide (TRULICITY) 2.35 TI/1.4ER SOPN   Other Relevant Orders   CMP14 + Anion Gap   POC Hbg A1C (Completed)   Microalbumin / Creatinine Urine Ratio      Musculoskeletal and Integument   Plantar fasciitis    Patient reporting right heel pain constant aching soreness throughout the day worse in the morning improves as she walks.  This has been ongoing for several months.  She has pain with dorsiflexion.  She has not tried anything to help with this pain.  On physical exam she is tender palpation on the heel and has pain with dorsiflexion.  No edema or erythema noted, no pain with active movement.  No joint line tenderness.  Strength and sensation intact.  Will treat as Planter fasciitis.  Patient instructed on stretching exercises for fascia of foot to use ice water bottle or tennis ball and roll foot.  Also given prescription for naproxen if acute flare of pain.  If this does not improve we can consider referring her to a foot specialist in the future.        Other   Hyperlipidemia - Primary    Currently taking simvastatin 20 mg and over-the-counter fish oil.  Will obtain lipid panel today, if LDL above 100 can consider increased dosing based off ASCVD risk.      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  Past Medical History:  Diagnosis Date   Diabetes mellitus    Fibroid    Headache    Herpes    High cholesterol    History of COVID-19 09/10/2020   Hx of gonorrhea 2017   Review of Systems:   Review of Systems  Constitutional: Negative.   HENT: Negative.    Eyes: Negative.   Respiratory: Negative.    Cardiovascular: Negative.   Gastrointestinal: Negative.   Genitourinary: Negative.   Musculoskeletal: Negative.   Skin:  Negative.   Neurological: Negative.   Psychiatric/Behavioral: Negative.      Physical Exam: Vitals:   08/26/21 0848  BP: 117/68  Pulse: 71  Temp: 97.6 F (36.4 C)  TempSrc: Oral  SpO2: 100%  Weight: 210 lb 11.2 oz (95.6 kg)  Height: 5\' 7"  (1.702 m)     General: alert and oriented, no acute distress HEENT: Conjunctiva nl , antiicteric sclerae, moist mucous membranes, no exudate or erythema Cardiovascular: Normal rate, regular rhythm.  No murmurs, rubs, or gallops Pulmonary : Equal breath sounds, No wheezes, rales, or rhonchi Abdominal: soft, nontender,  bowel sounds present Ext: No edema in lower extremities, no tenderness to palpation of lower extremities.   Assessment & Plan:   See Encounters Tab for problem based charting.  Patient seen with Dr. Philipp Ovens

## 2021-08-27 LAB — LIPID PANEL
Chol/HDL Ratio: 4.7 ratio — ABNORMAL HIGH (ref 0.0–4.4)
Cholesterol, Total: 177 mg/dL (ref 100–199)
HDL: 38 mg/dL — ABNORMAL LOW (ref >39)
LDL Chol Calc (NIH): 117 mg/dL — ABNORMAL HIGH (ref 0–99)
Triglycerides: 119 mg/dL (ref 0–149)
VLDL Cholesterol Cal: 22 mg/dL (ref 5–40)

## 2021-08-27 LAB — CBC WITH DIFFERENTIAL/PLATELET
Basophils Absolute: 0 10*3/uL (ref 0.0–0.2)
Basos: 0 %
EOS (ABSOLUTE): 0.2 10*3/uL (ref 0.0–0.4)
Eos: 4 %
Hematocrit: 38.8 % (ref 34.0–46.6)
Hemoglobin: 12.9 g/dL (ref 11.1–15.9)
Immature Grans (Abs): 0 10*3/uL (ref 0.0–0.1)
Immature Granulocytes: 0 %
Lymphocytes Absolute: 2.3 10*3/uL (ref 0.7–3.1)
Lymphs: 44 %
MCH: 30.9 pg (ref 26.6–33.0)
MCHC: 33.2 g/dL (ref 31.5–35.7)
MCV: 93 fL (ref 79–97)
Monocytes Absolute: 0.4 10*3/uL (ref 0.1–0.9)
Monocytes: 9 %
Neutrophils Absolute: 2.2 10*3/uL (ref 1.4–7.0)
Neutrophils: 43 %
Platelets: 210 10*3/uL (ref 150–450)
RBC: 4.17 x10E6/uL (ref 3.77–5.28)
RDW: 12.3 % (ref 11.7–15.4)
WBC: 5.1 10*3/uL (ref 3.4–10.8)

## 2021-08-27 LAB — CMP14 + ANION GAP
ALT: 15 IU/L (ref 0–32)
AST: 16 IU/L (ref 0–40)
Albumin/Globulin Ratio: 1.6 (ref 1.2–2.2)
Albumin: 3.8 g/dL (ref 3.8–4.8)
Alkaline Phosphatase: 64 IU/L (ref 44–121)
Anion Gap: 14 mmol/L (ref 10.0–18.0)
BUN/Creatinine Ratio: 20 (ref 9–23)
BUN: 12 mg/dL (ref 6–24)
Bilirubin Total: 0.3 mg/dL (ref 0.0–1.2)
CO2: 22 mmol/L (ref 20–29)
Calcium: 9.2 mg/dL (ref 8.7–10.2)
Chloride: 105 mmol/L (ref 96–106)
Creatinine, Ser: 0.6 mg/dL (ref 0.57–1.00)
Globulin, Total: 2.4 g/dL (ref 1.5–4.5)
Glucose: 113 mg/dL — ABNORMAL HIGH (ref 70–99)
Potassium: 4.8 mmol/L (ref 3.5–5.2)
Sodium: 141 mmol/L (ref 134–144)
Total Protein: 6.2 g/dL (ref 6.0–8.5)
eGFR: 111 mL/min/{1.73_m2} (ref >59)

## 2021-08-27 LAB — MICROALBUMIN / CREATININE URINE RATIO
Creatinine, Urine: 272.3 mg/dL
Microalb/Creat Ratio: 4 mg/g creat (ref 0–29)
Microalbumin, Urine: 10.9 ug/mL

## 2021-08-27 NOTE — Telephone Encounter (Signed)
DECISION  CAME BACK :    Approved on September 27 Request Reference Number: [id number]. TRULICITY INJ 8.18/4.0 is approved through 08/26/2022. For further questions, call [employer] at [phone].

## 2021-08-28 NOTE — Progress Notes (Signed)
Internal Medicine Clinic Attending  I saw and evaluated the patient.  I personally confirmed the key portions of the history and exam documented by Dr. Elliot Gurney and I reviewed pertinent patient test results.  The assessment, diagnosis, and plan were formulated together and I agree with the documentation in the resident's note.   LDL not at goal. Consider high intensity statin.

## 2021-09-01 ENCOUNTER — Other Ambulatory Visit: Payer: Self-pay | Admitting: Medical

## 2021-09-02 ENCOUNTER — Telehealth: Payer: Self-pay

## 2021-09-02 NOTE — Telephone Encounter (Signed)
Please call pt back for shoe inserted.

## 2021-09-03 NOTE — Telephone Encounter (Signed)
I returned phone call to patient about getting shoe inserts to help with her foot pain.The patient said that she went to the [facility] and they her it would be $2,000 I asked her about medicine and the gel that the doctor put her on  how was that working. The patient said it has been only 8 days and she can tell a difference.I told patient to continue doing what the doctor said and call us back if the pain gets worse. I told the patient in the doctors notes that if the medication and the exercise did not work that her he P would consider sending patient to a Mining engineer. Patient understood [place], [place] C10/5/20223:57 PM

## 2021-09-08 ENCOUNTER — Other Ambulatory Visit: Payer: Self-pay | Admitting: Internal Medicine

## 2021-09-08 DIAGNOSIS — M722 Plantar fascial fibromatosis: Secondary | ICD-10-CM

## 2021-09-09 NOTE — Telephone Encounter (Signed)
Was only prescribed a 14-day course of naproxen so will not refill at this time.

## 2021-09-11 ENCOUNTER — Telehealth: Payer: Self-pay | Admitting: Lactation Services

## 2021-09-11 NOTE — Telephone Encounter (Signed)
Received a request from the Pharmacy that patient needs refill for Metronidazole.   Called patient and she reports she is not in need of Metronidazole at this time.   Patient reports she is supposed to follow up in the office and has not done so. She is requesting a referral to a specialist as she is still having the same issues and the medication prescribed previously did not help.   Patient would like to be reached by My Chart instead of calling her with the information.   Will reach out to Dr. Ilda Basset to discuss next steps for patient.

## 2021-09-15 ENCOUNTER — Encounter: Payer: Self-pay | Admitting: Lactation Services

## 2021-09-15 NOTE — Telephone Encounter (Signed)
Opened in error

## 2021-09-19 ENCOUNTER — Other Ambulatory Visit: Payer: Self-pay

## 2021-09-19 ENCOUNTER — Ambulatory Visit (INDEPENDENT_AMBULATORY_CARE_PROVIDER_SITE_OTHER): Payer: Medicaid Other

## 2021-09-19 ENCOUNTER — Other Ambulatory Visit (HOSPITAL_COMMUNITY)
Admission: RE | Admit: 2021-09-19 | Discharge: 2021-09-19 | Disposition: A | Discharge status: Home / Self Care | Payer: Medicaid Other | Source: Ambulatory Visit | Attending: Family Medicine | Admitting: Family Medicine

## 2021-09-19 VITALS — BP 122/71 | HR 63 | Wt 203.8 lb

## 2021-09-19 DIAGNOSIS — N898 Other specified noninflammatory disorders of vagina: Secondary | ICD-10-CM

## 2021-09-19 MED ORDER — FLUCONAZOLE 150 MG PO TABS: 150.0000 mg | ORAL_TABLET | Freq: Every day | ORAL | 0 refills | Status: DC

## 2021-09-19 NOTE — Progress Notes (Signed)
Here today with complaint of vaginal irritation and white discharge. Diflucan tablet sent per protocol. Pt reports ongoing burning in vaginal area that is not associated with Herpes outbreak. Reports history of recurrent BV. Offered appt with provider to discuss vaginal burning. Self swab instructions given and specimen obtained. Explained we will contact pt with any abnormal results. Pt to schedule visit with provider prior to leaving today.   Apolonio Schneiders RN 09/19/21

## 2021-09-19 NOTE — Progress Notes (Signed)
Chart reviewed for nurse visit. Agree with plan of care.   Clarnce Flock, MD 09/19/21 12:03 PM

## 2021-09-22 ENCOUNTER — Other Ambulatory Visit: Payer: Self-pay | Admitting: Family Medicine

## 2021-09-22 LAB — CERVICOVAGINAL ANCILLARY ONLY
Bacterial Vaginitis (gardnerella): POSITIVE — AB
Candida Glabrata: NEGATIVE
Candida Vaginitis: NEGATIVE
Chlamydia: NEGATIVE
Comment: NEGATIVE
Comment: NEGATIVE
Comment: NEGATIVE
Comment: NEGATIVE
Comment: NEGATIVE
Comment: NORMAL
Neisseria Gonorrhea: NEGATIVE
Trichomonas: NEGATIVE

## 2021-09-22 MED ORDER — METRONIDAZOLE 500 MG PO TABS: 500.0000 mg | ORAL_TABLET | Freq: Two times a day (BID) | ORAL | 0 refills | Status: AC

## 2021-09-23 ENCOUNTER — Telehealth: Payer: Self-pay | Admitting: Lactation Services

## 2021-09-23 ENCOUNTER — Other Ambulatory Visit: Payer: Self-pay | Admitting: Lactation Services

## 2021-09-23 MED ORDER — METRONIDAZOLE 1 % EX GEL: Freq: Every day | CUTANEOUS | 0 refills | Status: DC

## 2021-09-23 NOTE — Telephone Encounter (Signed)
Opened in error

## 2021-09-23 NOTE — Progress Notes (Signed)
Sent in Metrogel per patients request.

## 2021-09-26 ENCOUNTER — Telehealth: Payer: Self-pay | Admitting: *Deleted

## 2021-09-26 DIAGNOSIS — B9689 Other specified bacterial agents as the cause of diseases classified elsewhere: Secondary | ICD-10-CM

## 2021-09-26 DIAGNOSIS — N760 Acute vaginitis: Secondary | ICD-10-CM

## 2021-09-26 MED ORDER — METRONIDAZOLE 0.75 % VA GEL: 1.0000 | Freq: Every day | VAGINAL | 0 refills | Status: AC

## 2021-09-26 NOTE — Telephone Encounter (Signed)
Received a voicemail from pharmacy patient came to pick up Metrogel and said was supposed to be vaginal; but what was sent in was topical. Also states patient did not pick up metronidazole or diflucan prescriptions. States not sure what we need to order but please discuss with patient or call pharmacy .  I called patient and discussed with her call from pharmacy.  I informed her we will send in correct vaginal metrogel ; will cancel topical RX and metronidazole tablets. She also states she was given the topical form last time and inserted in vaginally and was worried this was harmful. I informed her I did not think so because is same medication but different form; she was still concerned so I discussed with provider who agreed should not be harmful. I informed patient and informed her we want to avoid from now on- and should use vaginal metrogel if BV reoccurs. I also informed her if BV reoccurs often ; we recommend she see a provider- she states she already has an appointment and I reviewed that date/ time. I also briefly discuss ways to decrease BV. [place] Les[place],RN

## 2021-10-01 ENCOUNTER — Other Ambulatory Visit: Payer: Self-pay | Admitting: Adult Medicine

## 2021-10-01 DIAGNOSIS — Z1231 Encounter for screening mammogram for malignant neoplasm of breast: Secondary | ICD-10-CM

## 2021-10-16 ENCOUNTER — Ambulatory Visit: Payer: Medicaid Other | Admitting: Family Medicine

## 2021-10-19 ENCOUNTER — Other Ambulatory Visit: Payer: Self-pay

## 2021-10-19 ENCOUNTER — Encounter (HOSPITAL_COMMUNITY): Payer: Self-pay | Admitting: *Deleted

## 2021-10-19 ENCOUNTER — Ambulatory Visit (HOSPITAL_COMMUNITY)
Admission: EM | Admit: 2021-10-19 | Discharge: 2021-10-19 | Disposition: A | Discharge status: Home / Self Care | Payer: Medicaid Other | Attending: Nurse Practitioner | Admitting: Nurse Practitioner

## 2021-10-19 DIAGNOSIS — S01331A Puncture wound without foreign body of right ear, initial encounter: Secondary | ICD-10-CM

## 2021-10-19 DIAGNOSIS — T23171A Burn of first degree of right wrist, initial encounter: Secondary | ICD-10-CM | POA: Diagnosis not present

## 2021-10-19 DIAGNOSIS — S61212A Laceration without foreign body of right middle finger without damage to nail, initial encounter: Secondary | ICD-10-CM | POA: Diagnosis not present

## 2021-10-19 MED ORDER — MUPIROCIN CALCIUM 2 % EX CREA: 1.0000 "application " | TOPICAL_CREAM | Freq: Every day | CUTANEOUS | 0 refills | Status: DC

## 2021-10-19 NOTE — ED Triage Notes (Signed)
Infection at ear piercing site on rt. Pt closed a drawer on her Rt middle and has a cut .

## 2021-10-19 NOTE — Discharge Instructions (Signed)
EAR PIERCING: it does not appear that you have an infection of your piercing. It's important to clean it daily with mild soap and water (like dial). Monitor for bleeding, drainage, swelling, warmth or redness. I've sent in a cream that you can apply to the piercing site once a day for the next week.   BURN: the burn of your wrist is healing well. It does not appear to be infected. Keep moisturizing cream on it like you've been doing. Monitor for any drainage, swelling, warmth or redness.   CUT ON FINGER: the cut on your middle finger is minor and does not require any repair. The nurse cleaned it for you today. It should heal fine without any issue. Monitor for any drainage, swelling, warmth or redness.

## 2021-10-19 NOTE — ED Provider Notes (Signed)
[facility]    CSN: [id number] Arrival date & time: 10/19/21  1022      History   Chief Complaint Chief Complaint  Patient presents with   ear ring  infection RT   cut to finger    HPI Tanya Austin is a 50 y.o. female.   Tanya Austin is a 50 y.o. female that presents for evaluation for several complaints.  BURN INJURY: patient reports burn injury involving the right wrist.  Burn occurred 5 days ago. Patient reports that she was lifting the lid of a pot and the stem caused the burn. She's been treating it with cocoa butter. The skin at the site of the burn is now peeling. She denies any redness, pain, or drainage from the site. No fevers. It is itching some but otherwise doing ok per the patient.   EAR PIERCING: patient reports getting a tragus piercing of the right ear in June 2022. It has been doing fine up to a few weeks ago. She began to notice a burning sensation at the site but only with touch. She also reports that the area around the piercing is darker than usual. She has some crusting at the back of the piercing but denies any purulent drainage, odor, bleeding or swelling. No fevers. She's been cleaning it with a topical wound cleanser for the past week or so.   FINGER INJURY: patient reports a cut to the tip of her right middle finger. She accidentally slammed her hand in a drawer last night. The patent reports pain to the site of the injury. She denies any coldness, numbness or paresthesias in the affected area. There were no other injuries.    The following portions of the patient's history were reviewed and updated as appropriate: allergies, current medications, past family history, past medical history, past social history, past surgical history, and problem list.               Past Medical History:  Diagnosis Date   Diabetes mellitus    Fibroid    Headache    Herpes    High cholesterol    History of COVID-19 09/10/2020   Hx of  gonorrhea 2017    Patient Active Problem List   Diagnosis Date Noted   Diabetes ([place]) 08/26/2021   Hyperlipidemia 08/26/2021   Plantar fasciitis 08/26/2021   Vaginal candidiasis 02/23/2017   Bacterial vaginosis 08/04/2016    Past Surgical History:  Procedure Laterality Date   CERVICAL BIOPSY      OB History     Gravida  4   Para  4   Term  4   Preterm  0   AB  0   Living  4      SAB  0   IAB  0   Ectopic  0   Multiple  0   Live Births  4            Home Medications    Prior to Admission medications   Medication Sig Start Date End Date Taking? Authorizing Provider  mupirocin cream (BACTROBAN) 2 % Apply 1 application topically daily. Apply small amount with q-tip around site of piercing daily for 1 week 10/19/21  Yes Enrique Sack, FNP  acetaminophen (TYLENOL) 500 MG tablet Take 1,000 mg by mouth 2 (two) times daily as needed for moderate pain.    [provider]  B-D UF III MINI PEN NEEDLES 31G X 5 MM MISC SMARTSIG:SUB-Q 1 to 2  Times Daily Patient not taking: No sig reported 05/14/20   [provider]  cetirizine (ZYRTEC ALLERGY) 10 MG tablet Take 1 tablet (10 mg total) by mouth daily. Patient not taking: Reported on 09/19/2021 01/06/21   Volney American, PA-C  Cholecalciferol (VITAMIN D3) 3000 units TABS Take 1 capsule by mouth daily.     [provider]  Dulaglutide (TRULICITY) 1.02 HE/5.2DP SOPN Inject 0.75 mg into the skin once a week. 08/26/21 11/24/21  Delene Ruffini, MD  fish oil-omega-3 fatty acids 1000 MG capsule Take 1 g by mouth daily.    [provider]  fluconazole (DIFLUCAN) 150 MG tablet Take 1 tablet (150 mg total) by mouth daily. 09/19/21   Clarnce Flock, MD  fluticasone (FLONASE) 50 MCG/ACT nasal spray Place 1 spray into both nostrils in the morning and at bedtime. Patient not taking: Reported on 09/19/2021 01/06/21   Volney American, PA-C  IRON PO Take 1 tablet by mouth daily.  otc medication- not sure of dose    [provider]  lidocaine (XYLOCAINE) 5 % ointment Apply 1 application topically as needed. 04/24/21   Chancy Milroy, MD  metFORMIN (GLUCOPHAGE) 1000 MG tablet Take 1 tablet (1,000 mg total) by mouth 2 (two) times daily with a meal. 08/26/21 12/24/21  Delene Ruffini, MD  metroNIDAZOLE (METROGEL) 1 % gel Apply topically daily. Use for 5 nights 09/23/21   Clarnce Flock, MD  pioglitazone (ACTOS) 30 MG tablet Take 30 mg by mouth daily. 02/21/16   [provider]  predniSONE (DELTASONE) 20 MG tablet Take 2 tablets (40 mg total) by mouth daily with breakfast. 01/06/21   Volney American, PA-C  Probiotic Product (PROBIOTIC DAILY PO) Take 1 capsule by mouth daily.    [provider]  RESTASIS 0.05 % ophthalmic emulsion 1 drop 2 (two) times daily. 09/05/20   [provider]  simvastatin (ZOCOR) 20 MG tablet Take 20 mg by mouth at bedtime.     [provider]  valACYclovir (VALTREX) 500 MG tablet Take 1 tablet daily with no outbreak. Take 1 tablet BID x 3 days with an outbreak 02/05/21   Aletha Halim, MD  Insulin Glargine (TOUJEO SOLOSTAR) 300 UNIT/ML SOPN Inject 14 Units into the skin at bedtime. Patient not taking: Reported on 09/10/2020  09/25/20  [provider]  promethazine (PHENERGAN) 25 MG tablet Take 1 tablet (25 mg total) by mouth every 6 (six) hours as needed for nausea or vomiting. Patient not taking: Reported on 09/10/2020 10/24/17 09/25/20  Gareth Morgan, MD    Family History Family History  Problem Relation Age of Onset   Hypertension Other    Hyperlipidemia Other    Heart disease Other    Hypertension Mother    Diabetes Mother    Hypertension Father     Social History Social History   Tobacco Use   Smoking status: Former    Types: Cigarettes    Quit date: 06/09/2000    Years since quitting: 21.3   Smokeless tobacco: Never  Vaping Use   Vaping Use: Never used  Substance  Use Topics   Alcohol use: No    Comment: Socially on weekends   Drug use: No    Types: Marijuana     Allergies   Patient has no known allergies.   Review of Systems Review of Systems  Constitutional:  Negative for fever.  HENT:  Negative for ear discharge and ear pain.   Gastrointestinal:  Negative for nausea and  vomiting.  Skin:  Positive for wound.  Neurological:  Negative for weakness and numbness.  All other systems reviewed and are negative.   Physical Exam Triage Vital Signs ED Triage Vitals  Enc Vitals Group     BP 10/19/21 1147 125/78     Pulse Rate 10/19/21 1147 64     Resp 10/19/21 1147 20     Temp 10/19/21 1147 98.2 F (36.8 C)     Temp src --      SpO2 10/19/21 1147 98 %     Weight --      Height --      Head Circumference --      Peak Flow --      Pain Score 10/19/21 1145 6     Pain Loc --      Pain Edu? --      Excl. in [place]? --    No data found.  Updated Vital Signs BP 125/78   Pulse 64   Temp 98.2 F (36.8 C)   Resp 20   LMP 10/17/2021 (Approximate)   SpO2 98%   Visual Acuity Right Eye Distance:   Left Eye Distance:   Bilateral Distance:    Right Eye Near:   Left Eye Near:    Bilateral Near:     Physical Exam Vitals reviewed.  Constitutional:      General: She is not in acute distress.    Appearance: Normal appearance. She is not ill-appearing or toxic-appearing.  HENT:     Head: Normocephalic.     Right Ear: Hearing, tympanic membrane, ear canal and external ear normal.     Ears:     Comments: Right tragus piercing noted. Darkened colored skin noted around site of piercing. Crusting noted at posterior aspect of piercing. No drainage, discharge, bleeding, swelling or odor noted.     Nose: Nose normal.     Mouth/Throat:     Mouth: Mucous membranes are moist.  Eyes:     Conjunctiva/sclera: Conjunctivae normal.  Cardiovascular:     Rate and Rhythm: Normal rate.  Pulmonary:     Effort: Pulmonary effort is normal.   Musculoskeletal:        General: Normal range of motion.     Cervical back: Normal range of motion and neck supple.  Skin:    General: Skin is warm and dry.     Findings: Burn and laceration present.     Comments: (1) Superficial burn injury noted to the palmar aspect of the right wrist. Skin is dry. Peeling noted. No moisture, redness, swelling or tenderness noted. (2) Superficial laceration measuring approximately 0.5 mm noted to the distal aspect of the right middle finger. Nail intact. No swelling, bleeding or drainage noted. No indication for primary closure.   Neurological:     General: No focal deficit present.     Mental Status: She is alert and oriented to person, place, and time.  Psychiatric:        Mood and Affect: Mood normal.        Behavior: Behavior normal.     UC Treatments / Results  Labs (all labs ordered are listed, but only abnormal results are displayed) Labs Reviewed - No data to display  EKG   Radiology No results found.  Procedures Procedures (including critical care time)  Medications Ordered in UC Medications - No data to display  Initial Impression / Assessment and Plan / UC Course  I have reviewed the triage vital signs and  the nursing notes.  Pertinent labs & imaging results that were available during my care of the patient were reviewed by me and considered in my medical decision making (see chart for details).     50 y.o. female that presents for evaluation for several complaints. No acute intervention required after close assessment of all three complaints (see above). No indication of infection or need for primary closure. Reassurance was provided to patient. Finger dressed per patient request. Discussed home care and indications for follow-up. Patient agreeable.   Today's evaluation has revealed no signs of a dangerous process. Discussed diagnosis with patient and/or guardian. Patient and/or guardian aware of their diagnosis, possible red  flag symptoms to watch out for and need for close follow up. Patient and/or guardian understands verbal and written discharge instructions. Patient and/or guardian comfortable with plan and disposition.  Patient and/or guardian has a clear mental status at this time, good insight into illness (after discussion and teaching) and has clear judgment to make decisions regarding their care  This care was provided during an unprecedented National Emergency due to the Novel Coronavirus (COVID-19) pandemic. COVID-19 infections and transmission risks place heavy strains on healthcare resources.  As this pandemic evolves, our facility, providers, and staff strive to respond fluidly, to remain operational, and to provide care relative to available resources and information. Outcomes are unpredictable and treatments are without well-defined guidelines. Further, the impact of COVID-19 on all aspects of urgent care, including the impact to patients seeking care for reasons other than COVID-19, is unavoidable during this national emergency. At this time of the global pandemic, management of patients has significantly changed, even for non-COVID positive patients given high local and regional COVID volumes at this time requiring high healthcare system and resource utilization. The standard of care for management of both COVID suspected and non-COVID suspected patients continues to change rapidly at the local, regional, national, and global levels. This patient was worked up and treated to the best available but ever changing evidence and resources available at this current time.   Documentation was completed with the aid of voice recognition software. Transcription may contain typographical errors. Final Clinical Impressions(s) / UC Diagnoses   Final diagnoses:  Complication of ear piercing, right, initial encounter  Superficial burn of wrist, right, initial encounter  Laceration of right middle finger without foreign  body without damage to nail, initial encounter     Discharge Instructions      EAR PIERCING: it does not appear that you have an infection of your piercing. It's important to clean it daily with mild soap and water (like dial). Monitor for bleeding, drainage, swelling, warmth or redness. I've sent in a cream that you can apply to the piercing site once a day for the next week.   BURN: the burn of your wrist is healing well. It does not appear to be infected. Keep moisturizing cream on it like you've been doing. Monitor for any drainage, swelling, warmth or redness.   CUT ON FINGER: the cut on your middle finger is minor and does not require any repair. The nurse cleaned it for you today. It should heal fine without any issue. Monitor for any drainage, swelling, warmth or redness.      ED Prescriptions     Medication Sig Dispense Auth. Provider   mupirocin cream (BACTROBAN) 2 % Apply 1 application topically daily. Apply small amount with q-tip around site of piercing daily for 1 week 15 g Enrique Sack, FNP  PDMP not reviewed this encounter.   Enrique Sack, [place] 10/19/21 1328

## 2021-10-20 ENCOUNTER — Telehealth (HOSPITAL_COMMUNITY): Payer: Self-pay

## 2021-10-20 NOTE — Telephone Encounter (Signed)
Pt no answered phone.

## 2021-10-27 ENCOUNTER — Other Ambulatory Visit: Payer: Self-pay | Admitting: General Practice

## 2021-10-27 DIAGNOSIS — B009 Herpesviral infection, unspecified: Secondary | ICD-10-CM

## 2021-10-27 MED ORDER — VALACYCLOVIR HCL 500 MG PO TABS: ORAL_TABLET | ORAL | 3 refills | Status: DC

## 2021-11-05 LAB — HM COLONOSCOPY

## 2021-11-14 ENCOUNTER — Other Ambulatory Visit (HOSPITAL_COMMUNITY)
Admission: RE | Admit: 2021-11-14 | Discharge: 2021-11-14 | Disposition: A | Discharge status: Home / Self Care | Payer: Medicaid Other | Source: Ambulatory Visit | Attending: Family Medicine | Admitting: Family Medicine

## 2021-11-14 ENCOUNTER — Ambulatory Visit (INDEPENDENT_AMBULATORY_CARE_PROVIDER_SITE_OTHER): Payer: Medicaid Other | Admitting: Family Medicine

## 2021-11-14 ENCOUNTER — Encounter: Payer: Self-pay | Admitting: Family Medicine

## 2021-11-14 ENCOUNTER — Other Ambulatory Visit: Payer: Self-pay

## 2021-11-14 VITALS — BP 121/62 | HR 84 | Ht 67.0 in | Wt 209.6 lb

## 2021-11-14 DIAGNOSIS — N760 Acute vaginitis: Secondary | ICD-10-CM

## 2021-11-14 DIAGNOSIS — B9689 Other specified bacterial agents as the cause of diseases classified elsewhere: Secondary | ICD-10-CM

## 2021-11-14 DIAGNOSIS — N9489 Other specified conditions associated with female genital organs and menstrual cycle: Secondary | ICD-10-CM | POA: Insufficient documentation

## 2021-11-14 MED ORDER — LIDOCAINE 5 % EX OINT: 1.0000 "application " | TOPICAL_OINTMENT | CUTANEOUS | 1 refills | Status: DC | PRN

## 2021-11-14 MED ORDER — TRIAMCINOLONE ACETONIDE 0.5 % EX OINT: 1.0000 "application " | TOPICAL_OINTMENT | Freq: Two times a day (BID) | CUTANEOUS | 0 refills | Status: DC

## 2021-11-14 MED ORDER — METRONIDAZOLE 0.75 % VA GEL: 1.0000 | Freq: Two times a day (BID) | VAGINAL | 0 refills | Status: DC

## 2021-11-14 NOTE — Progress Notes (Signed)
° °  Subjective:    Patient ID: Tanya Austin is a 50 y.o. female presenting with Follow-up  on 11/14/2021  HPI: Reports on-going issues with BV. Notes some burning, seems like this is always an issue. This goes on x 8 years. Cannot seem to see what is going on. Has bleeding after intercourse. Notes burning at rectum and vagina. Worse with BV. Denies itching. Reports last cycle bled x 3 weeks and very heavy. Has tried lidocaine in the past.    Review of Systems  Constitutional:  Negative for chills and fever.  Respiratory:  Negative for shortness of breath.   Cardiovascular:  Negative for chest pain.  Gastrointestinal:  Negative for abdominal pain, nausea and vomiting.  Genitourinary:  Negative for dysuria.  Skin:  Negative for rash.     Objective:    LMP 10/17/2021 (Approximate)  Physical Exam Constitutional:      General: She is not in acute distress.    Appearance: She is well-developed.  HENT:     Head: Normocephalic and atraumatic.  Eyes:     General: No scleral icterus. Cardiovascular:     Rate and Rhythm: Normal rate.  Pulmonary:     Effort: Pulmonary effort is normal.  Abdominal:     Palpations: Abdomen is soft.  Genitourinary:    Comments: BUS normal, vagina is pink and rugated, cervix is nulliparous without lesion, uterus is small and anteverted, no adnexal mass or tenderness. There is no abnormal vaginal tissue.  Musculoskeletal:     Cervical back: Neck supple.  Skin:    General: Skin is warm and dry.  Neurological:     Mental Status: She is alert and oriented to person, place, and time.        Assessment & Plan:   Problem List Items Addressed This Visit       Unprioritized   Bacterial vaginosis    Recurrent - add boric acid suppositories. Good glycemic control.      Relevant Medications   metroNIDAZOLE (METROGEL VAGINAL) 0.75 % vaginal gel   Vulvar burning - Primary    Trial of steroid to see if this helps. Add lidocaine prn. Consider  pelvic PT referral if no improvement.      Relevant Medications   lidocaine (XYLOCAINE) 5 % ointment   triamcinolone ointment (KENALOG) 0.5 %   Other Relevant Orders   Cervicovaginal ancillary only( [place])    Return in about 3 months (around 02/12/2022).  Tanya Austin 11/14/2021 8:28 AM

## 2021-11-17 DIAGNOSIS — N9489 Other specified conditions associated with female genital organs and menstrual cycle: Secondary | ICD-10-CM | POA: Insufficient documentation

## 2021-11-17 LAB — CERVICOVAGINAL ANCILLARY ONLY
Bacterial Vaginitis (gardnerella): NEGATIVE
Candida Glabrata: NEGATIVE
Candida Vaginitis: NEGATIVE
Chlamydia: NEGATIVE
Comment: NEGATIVE
Comment: NEGATIVE
Comment: NEGATIVE
Comment: NEGATIVE
Comment: NEGATIVE
Comment: NORMAL
Neisseria Gonorrhea: NEGATIVE
Trichomonas: NEGATIVE

## 2021-11-17 NOTE — Addendum Note (Signed)
Addended by: Donnamae Jude on: 11/17/2021 09:19 AM   Modules accepted: Level of Service

## 2021-11-17 NOTE — Assessment & Plan Note (Signed)
Recurrent - add boric acid suppositories. Good glycemic control.

## 2021-11-17 NOTE — Assessment & Plan Note (Signed)
Trial of steroid to see if this helps. Add lidocaine prn. Consider pelvic PT referral if no improvement.

## 2021-11-19 ENCOUNTER — Ambulatory Visit
Admission: RE | Admit: 2021-11-19 | Discharge: 2021-11-19 | Disposition: A | Discharge status: Home / Self Care | Payer: Medicaid Other | Source: Ambulatory Visit | Attending: Adult Medicine | Admitting: Adult Medicine

## 2021-11-19 ENCOUNTER — Telehealth: Payer: Self-pay

## 2021-11-19 DIAGNOSIS — Z1231 Encounter for screening mammogram for malignant neoplasm of breast: Secondary | ICD-10-CM

## 2021-11-19 NOTE — Telephone Encounter (Signed)
Pt states she has sinus drainage, please call pt back.

## 2021-11-19 NOTE — Telephone Encounter (Signed)
Return pt's call; no answer - left message to call the office. 

## 2021-11-20 ENCOUNTER — Other Ambulatory Visit: Payer: Self-pay

## 2021-11-20 ENCOUNTER — Other Ambulatory Visit: Payer: Self-pay | Admitting: Student

## 2021-11-20 ENCOUNTER — Encounter: Payer: Self-pay | Admitting: Student

## 2021-11-20 ENCOUNTER — Ambulatory Visit (INDEPENDENT_AMBULATORY_CARE_PROVIDER_SITE_OTHER): Payer: Medicaid Other | Admitting: Student

## 2021-11-20 VITALS — BP 125/76 | HR 73 | Temp 98.1°F | Ht 67.0 in | Wt 211.6 lb

## 2021-11-20 DIAGNOSIS — E119 Type 2 diabetes mellitus without complications: Secondary | ICD-10-CM | POA: Diagnosis not present

## 2021-11-20 DIAGNOSIS — Z0000 Encounter for general adult medical examination without abnormal findings: Secondary | ICD-10-CM | POA: Insufficient documentation

## 2021-11-20 DIAGNOSIS — J309 Allergic rhinitis, unspecified: Secondary | ICD-10-CM | POA: Diagnosis not present

## 2021-11-20 DIAGNOSIS — J019 Acute sinusitis, unspecified: Secondary | ICD-10-CM

## 2021-11-20 MED ORDER — GUAIFENESIN-DM 100-10 MG/5ML PO SYRP: 5.0000 mL | ORAL_SOLUTION | ORAL | 0 refills | Status: DC | PRN

## 2021-11-20 MED ORDER — FLUTICASONE PROPIONATE 50 MCG/ACT NA SUSP: 1.0000 | Freq: Two times a day (BID) | NASAL | 1 refills | Status: DC

## 2021-11-20 MED ORDER — PSEUDOEPHEDRINE HCL ER 120 MG PO TB12: 120.0000 mg | ORAL_TABLET | Freq: Two times a day (BID) | ORAL | 0 refills | Status: AC | PRN

## 2021-11-20 NOTE — Assessment & Plan Note (Signed)
-   Patient reports having a colonoscopy at [facility] 3 weeks ago.  Please obtain record -She declined flu shot, pneumonia shot or COVID vaccines

## 2021-11-20 NOTE — Assessment & Plan Note (Addendum)
Patient endorses 1 month history of sinus drainage, sore throat, coughing with sputum and raspy voice.  She denies fever, chills, shortness of breath, GI symptoms.  States that her symptoms have stayed the same throughout the month.  She has not tried any medications for symptoms.  She has not had the flu vaccine or COVID-vaccine.  She denies any sick contacts.  She reports issue with her sinus since childhood.  She will occasionally take allergy medications.  States that Zyrtec and Claritin made her sleepy.  She tolerated Allegra okay.  Physical exam was reassuring.  TM were normal bilaterally.  There was erythema of the nasal mucosa with hypertrophic turbinates without obstructions.  No exudative discharge seen in the posterior pharynx.  Mild tenderness to palpation of the upper nasal bridge.  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  No evidence of bacterial infection.  -Start Flonase, Sudafed and Robitussin-DM for symptoms control. -Tylenol as needed for pain -Allegra for allergy symptoms -She will let us know if her symptoms fail to improve or get worse

## 2021-11-20 NOTE — Patient Instructions (Addendum)
Ms. Honeycutt,  It was a pleasure seeing you in the clinic today. I am sorry that your sinus symptoms are not getting better.  I prescribed steroid nasal spray, Sudafed, and Robitussin DM for help with you symptoms.  Sudafed and Robitussin-DM may not recover by Medicaid; however they are affordable over-the-counter.  You can also take Allegra for allergy symptoms.  You can also try Neti pot to rinse out nasal discharge.  Please call us if your symptoms do not get better  Please return next month for your diabetes follow-up.  Take care,  Dr. Alfonse Spruce

## 2021-11-20 NOTE — Progress Notes (Signed)
° °  CC: Sinus symptoms  HPI:  [name] is a 50 y.o. living with type 2 diabetes, hyperlipidemia who presents for 1 month of sinus drainage, coughing and sore throat.  Please see problem based charting for detail  Past Medical History:  Diagnosis Date   Diabetes mellitus    Fibroid    Headache    Herpes    High cholesterol    History of COVID-19 09/10/2020   Hx of gonorrhea 2017   Review of Systems:  per HPI  Physical Exam:  Vitals:   11/20/21 0908  BP: 125/76  Pulse: 73  Temp: 98.1 F (36.7 C)  TempSrc: Oral  SpO2: 100%  Weight: 211 lb 9.6 oz (96 kg)  Height: 5\' 7"  (1.702 m)   Physical Exam Constitutional:      General: She is not in acute distress.    Appearance: She is not ill-appearing.  HENT:     Head: Normocephalic.     Comments: Mild tenderness to palpation in the upper nasal bridge.  No pain to palpation at frontal sinus or maxillary sinuses.    Right Ear: Tympanic membrane, ear canal and external ear normal. There is no impacted cerumen.     Left Ear: Tympanic membrane, ear canal and external ear normal. There is no impacted cerumen.     Nose:     Comments: Mucosal erythema.  Hypertrophic turbinates.  No obstruction.    Mouth/Throat:     Comments: No discharge or exudate seen at the posterior pharynx. Eyes:     General:        Right eye: No discharge.        Left eye: No discharge.     Conjunctiva/sclera: Conjunctivae normal.  Cardiovascular:     Rate and Rhythm: Normal rate and regular rhythm.  Pulmonary:     Effort: Pulmonary effort is normal. No respiratory distress.     Breath sounds: Normal breath sounds. No wheezing.  Skin:    General: Skin is warm.  Neurological:     General: No focal deficit present.     Mental Status: She is alert and oriented to person, place, and time.  Psychiatric:        Mood and Affect: Mood normal.        Behavior: Behavior normal.     Assessment & Plan:   See Encounters Tab for problem based  charting.  Patient discussed with Dr.  Saverio Danker

## 2021-11-20 NOTE — Assessment & Plan Note (Signed)
A1c was 6.13 months ago.  She is currently taking metformin and pioglitazone.  She has stopped Trulicity about 1 month ago due to GI side effects.  She declined A1c check today.  She said that she will come back next month for diabetes follow-up.

## 2021-12-02 NOTE — Progress Notes (Signed)
Internal Medicine Clinic Attending  Case discussed with Dr. Nguyen  At the time of the visit.  We reviewed the resident's history and exam and pertinent patient test results.  I agree with the assessment, diagnosis, and plan of care documented in the resident's note. 

## 2021-12-18 ENCOUNTER — Encounter: Payer: Medicaid Other | Admitting: Internal Medicine

## 2021-12-25 ENCOUNTER — Encounter: Payer: Medicaid Other | Admitting: Internal Medicine

## 2021-12-29 ENCOUNTER — Ambulatory Visit: Payer: Medicaid Other | Admitting: Internal Medicine

## 2021-12-29 ENCOUNTER — Encounter: Payer: Self-pay | Admitting: Internal Medicine

## 2021-12-29 VITALS — BP 127/54 | HR 69 | Temp 97.9°F | Wt 215.6 lb

## 2021-12-29 DIAGNOSIS — E119 Type 2 diabetes mellitus without complications: Secondary | ICD-10-CM | POA: Diagnosis present

## 2021-12-29 DIAGNOSIS — F419 Anxiety disorder, unspecified: Secondary | ICD-10-CM | POA: Diagnosis not present

## 2021-12-29 DIAGNOSIS — N9489 Other specified conditions associated with female genital organs and menstrual cycle: Secondary | ICD-10-CM

## 2021-12-29 DIAGNOSIS — E785 Hyperlipidemia, unspecified: Secondary | ICD-10-CM | POA: Diagnosis not present

## 2021-12-29 LAB — POCT GLYCOSYLATED HEMOGLOBIN (HGB A1C): Hemoglobin A1C: 5.9 % — AB (ref 4.0–5.6)

## 2021-12-29 LAB — GLUCOSE, CAPILLARY: Glucose-Capillary: 97 mg/dL (ref 70–99)

## 2021-12-29 MED ORDER — JANUMET 50-1000 MG PO TABS
1.0000 | ORAL_TABLET | Freq: Two times a day (BID) | ORAL | 3 refills | Status: DC
Start: 2021-12-29 — End: 2022-06-22

## 2021-12-29 NOTE — Assessment & Plan Note (Signed)
Patient reports persistent vaginal urning for the past 5 years. She has a hx of recurrent BV and states that burning is exacerbated during BV infections. She has been treated with boric acid, but burning never fully resolves. She has seen OB in December and given lidocaine and steroid with no improvement. She states that her periods are still regular. Suspect she may be experiencing perimenopausal symptoms given age and persistence of symptoms despite treatment for recurrent infections. Will plan to have patient return to clinic for pelvic exam. Can consider topical estrogen therapy at that time if appropriate.

## 2021-12-29 NOTE — Progress Notes (Signed)
° °  CC: diabetes follow up  HPI:Tanya Austin is a 51 y.o. female who presents for evaluation of diabetes. Please see individual problem based A/P for details.   Depression, PHQ-9: Based on the patients  [facility] Visit from 08/26/2021 in [place]  PHQ-9 Total Score 0      score we have 0.  Past Medical History:  Diagnosis Date   Diabetes mellitus    Fibroid    Headache    Herpes    High cholesterol    History of COVID-19 09/10/2020   Hx of gonorrhea 2017   Review of Systems:   Review of Systems  Constitutional: Negative.   HENT: Negative.    Eyes: Negative.   Respiratory: Negative.    Cardiovascular: Negative.   Gastrointestinal: Negative.   Genitourinary: Negative.   Musculoskeletal: Negative.   Skin:  Positive for itching.       Intermittent itching feet  Neurological: Negative.   Endo/Heme/Allergies: Negative.   Psychiatric/Behavioral: Negative.      Physical Exam: Vitals:   12/29/21 0902  BP: (!) 127/54  Pulse: 69  Temp: 97.9 F (36.6 C)  TempSrc: Oral  SpO2: 99%  Weight: 215 lb 9.6 oz (97.8 kg)     General: alert and oriented HEENT: Conjunctiva nl , antiicteric sclerae, moist mucous membranes, no exudate or erythema Cardiovascular: Normal rate, regular rhythm.  No murmurs, rubs, or gallops Pulmonary : Equal breath sounds, No wheezes, rales, or rhonchi Abdominal: soft, nontender,  bowel sounds present Ext: No edema in lower extremities, no tenderness to palpation of lower extremities.  Feet: pulses intact, warm and well perfused. Sensation to gross and fine touch intact. No calluses or wounds.   Assessment & Plan:   See Encounters Tab for problem based charting.  Patient discussed with Dr. Philipp Ovens

## 2021-12-29 NOTE — Assessment & Plan Note (Signed)
Patient has not been routinely taking statin.  Previous lipid panel show LDL over 100. Given concomitant DM, patient would benefit from being on moderate intensity statin. She has simvastatin 20 prescribed. Discussed with her the importance of statin adherence. She voiced understanding. Will continue this medication for now.

## 2021-12-29 NOTE — Patient Instructions (Addendum)
Continue the Janumet 50-1000mg  twice daily.  - stop the metformin - stop the pioglitazone  For cholesterol - take simvastatin 20mg  once daily  For your vaginal burning - either schedule an appointment with [place] for a pelvic exam or call your OBGYN for an appointment.   For your anxiety: - please call your psychiatrist's office.

## 2021-12-29 NOTE — Assessment & Plan Note (Addendum)
Patient reports she was taking Janumet and pioglitazone. Janumet was discontinued at previous OV, and Metformin was started in its place to be taken in addition to her pioglitazone. She states she thought she had to finish her Janumet before starting on the metformin.  She does not check her blood sugar at home. States that although A1C at goal today, she is concerned about recent weight gain. States she has been eating poor diet and not exercising due to recent life stressors including family loss and new job.  Overdue for eye exam. She states she has not been since 2021 due to pandemic.  A1c 5.9 today at goal. DM well controlled despite weight gain.  Have restarted patient on Janumet 50-1000 BID. Discontinued metformin. Pioglitazone also discontinued due to A1c below goal of 7. Also discussed proper diet and exercise to maintain control over Dm and facilitate weight loss. Patient follows with Dr. Zadie Rhine for eye appointments. Advised her to schedule appointment.

## 2021-12-29 NOTE — Assessment & Plan Note (Signed)
Patient reports feeling anxious, difficulty sleeping, fidgeting, fatigued, and frequently stressed for the past three years. These symptoms have been exacerbated recently due to change in job and recent death of her sister.  She has been receiving Xanax through [facility]. She follows with her routinely, but missed her last appointment.  Advised patient follow up with psychiatry for further management. Will request record from their office.

## 2022-01-01 NOTE — Progress Notes (Signed)
Internal Medicine Clinic Attending  Case discussed with Dr. Elliot Gurney  At the time of the visit.  We reviewed the residents history and exam and pertinent patient test results.  I agree with the assessment, diagnosis, and plan of care documented in the residents note.   Patient's Janumet was discontinued in September and she was switched to Trulicity for the added benefit of weight loss. Metformin and pioglitazone were continued. Unfortunately she was unable to tolerate GLP-1 therapy due to GI side effects and she has since restarted Janumet. Plan to continue this. Hgb A1c today is 5.9, we are discontinuing her pioglitazone.

## 2022-01-06 ENCOUNTER — Ambulatory Visit (HOSPITAL_COMMUNITY)
Admission: RE | Admit: 2022-01-06 | Discharge: 2022-01-06 | Disposition: A | Discharge status: Home / Self Care | Payer: Medicaid Other | Source: Ambulatory Visit | Attending: Internal Medicine | Admitting: Internal Medicine

## 2022-01-06 ENCOUNTER — Ambulatory Visit (INDEPENDENT_AMBULATORY_CARE_PROVIDER_SITE_OTHER): Payer: Medicaid Other | Admitting: Internal Medicine

## 2022-01-06 VITALS — BP 116/67 | HR 72 | Temp 97.7°F | Ht 67.0 in | Wt 213.6 lb

## 2022-01-06 DIAGNOSIS — F4321 Adjustment disorder with depressed mood: Secondary | ICD-10-CM

## 2022-01-06 DIAGNOSIS — R42 Dizziness and giddiness: Secondary | ICD-10-CM

## 2022-01-06 DIAGNOSIS — J309 Allergic rhinitis, unspecified: Secondary | ICD-10-CM

## 2022-01-06 HISTORY — DX: Dizziness and giddiness: R42

## 2022-01-06 LAB — GLUCOSE, CAPILLARY: Glucose-Capillary: 172 mg/dL — ABNORMAL HIGH (ref 70–99)

## 2022-01-06 NOTE — Progress Notes (Signed)
° °  CC: nausea, dizziness, diarrhea  HPI:  [name] is a 51 y.o. PMH noted below, who presents to the [facility] with complaints of nausea, dizziness, diarrhea. To see the management of his acute and chronic conditions, please refer to the A&P note under the encounters tab.   Past Medical History:  Diagnosis Date   Diabetes mellitus    Fibroid    Headache    Herpes    High cholesterol    History of COVID-19 09/10/2020   Hx of gonorrhea 2017   Review of Systems:  positive for loose stools, nausea, dizziness, anxiety, headache negative for chest pain, shortness of breath, palpitations, syncope, tremors, vision changes, fever, chills  Physical Exam: Gen: Teary female in NAD HEENT: normocephalic atraumatic, MMM CV: RRR, no m/r/g, no pedal edema Resp: CTAB, normal WOB  GI: soft, nontender, normal bowel sounds MSK: moves all extremities without difficulty Skin:warm and dry Neuro:alert answering questions appropriately, no focal deficits, normal gait, normal coordination Psych: tearful affect   Assessment & Plan:   See Encounters Tab for problem based charting.  Patient discussed with Dr. Evette Austin

## 2022-01-06 NOTE — Assessment & Plan Note (Addendum)
Patient coming in with vague non-specific symptoms including near syncope, dizziness episodically lasting 5 minutes, nausea, and 1-2 episodes a day of loose stools. She says that these have been going on since the beginning of January before any changes were made to her medications.  Denies any fever, chills, chest pain, palpitations or shortness of breath. While in the clinic today patient had an episode of dizziness. Blood glucose at that time was normal and EKG was taken. EKG showed normal sinus rhythm with a solitary PAC. Will check basic labs today to rule out thyroid, anemia, or electrolyte disturbances. However exam overall reassuring today with no neuro deficits and normal orthostatic vitals. She does endorse a lot of life stress at the moment which may be exacerbating her symptoms.   Plan: - cbc, cmp, tsh today  Addendum: labs WNL

## 2022-01-06 NOTE — Assessment & Plan Note (Signed)
Recommended patient continue use of flonase, sutafed for symptom management as well as saline rinses.

## 2022-01-06 NOTE — Patient Instructions (Addendum)
Tanya Austin  It was a pleasure seeing you in the clinic today.   We talked about the dizziness and nausea that you have been having. We took an EKG which was very reassuring and did not show any signs of problems with your heart. We are going to check some labs today and will have you follow up with [place] again in two weeks.  Please call our clinic at [phone] if you have any questions or concerns. The best time to call is Monday-Friday from 9am-4pm, but there is someone available 24/7 at the same number. If you need medication refills, please notify your pharmacy one week in advance and they will send [place] a request.   Thank you for letting us take part in your care. We look forward to seeing you next time!

## 2022-01-06 NOTE — Assessment & Plan Note (Signed)
Patient reports loss of her sister less than a week ago. This is a very stressful life event but she feels well supported. Denies any SI and does not feel like she needs to talk to anyone at this time. - continue to monitor for depressive symptoms

## 2022-01-07 LAB — CMP14 + ANION GAP
ALT: 17 IU/L (ref 0–32)
AST: 14 IU/L (ref 0–40)
Albumin/Globulin Ratio: 1.6 (ref 1.2–2.2)
Albumin: 4.1 g/dL (ref 3.8–4.8)
Alkaline Phosphatase: 77 IU/L (ref 44–121)
Anion Gap: 10 mmol/L (ref 10.0–18.0)
BUN/Creatinine Ratio: 21 (ref 9–23)
BUN: 14 mg/dL (ref 6–24)
Bilirubin Total: 0.2 mg/dL (ref 0.0–1.2)
CO2: 27 mmol/L (ref 20–29)
Calcium: 9.6 mg/dL (ref 8.7–10.2)
Chloride: 105 mmol/L (ref 96–106)
Creatinine, Ser: 0.68 mg/dL (ref 0.57–1.00)
Globulin, Total: 2.6 g/dL (ref 1.5–4.5)
Glucose: 111 mg/dL — ABNORMAL HIGH (ref 70–99)
Potassium: 4.9 mmol/L (ref 3.5–5.2)
Sodium: 142 mmol/L (ref 134–144)
Total Protein: 6.7 g/dL (ref 6.0–8.5)
eGFR: 107 mL/min/{1.73_m2} (ref >59)

## 2022-01-07 LAB — CBC
Hematocrit: 39 % (ref 34.0–46.6)
Hemoglobin: 13.2 g/dL (ref 11.1–15.9)
MCH: 31.5 pg (ref 26.6–33.0)
MCHC: 33.8 g/dL (ref 31.5–35.7)
MCV: 93 fL (ref 79–97)
Platelets: 214 10*3/uL (ref 150–450)
RBC: 4.19 x10E6/uL (ref 3.77–5.28)
RDW: 12.4 % (ref 11.7–15.4)
WBC: 5.1 10*3/uL (ref 3.4–10.8)

## 2022-01-07 LAB — TSH: TSH: 0.888 u[IU]/mL (ref 0.450–4.500)

## 2022-01-07 NOTE — Progress Notes (Signed)
Internal Medicine Clinic Attending ° °Case discussed with Dr. DeMaio  At the time of the visit.  We reviewed the resident’s history and exam and pertinent patient test results.  I agree with the assessment, diagnosis, and plan of care documented in the resident’s note. ° ° °

## 2022-01-14 ENCOUNTER — Ambulatory Visit (HOSPITAL_COMMUNITY)
Admission: RE | Admit: 2022-01-14 | Discharge: 2022-01-14 | Disposition: A | Discharge status: Home / Self Care | Payer: Medicaid Other | Source: Ambulatory Visit | Attending: Family Medicine | Admitting: Family Medicine

## 2022-01-15 ENCOUNTER — Other Ambulatory Visit: Payer: Self-pay | Admitting: Family Medicine

## 2022-01-15 DIAGNOSIS — N898 Other specified noninflammatory disorders of vagina: Secondary | ICD-10-CM

## 2022-01-16 ENCOUNTER — Encounter: Payer: Self-pay | Admitting: Family Medicine

## 2022-01-16 MED ORDER — FLUCONAZOLE 150 MG PO TABS: 150.0000 mg | ORAL_TABLET | Freq: Every day | ORAL | 2 refills | Status: DC

## 2022-01-19 ENCOUNTER — Ambulatory Visit (INDEPENDENT_AMBULATORY_CARE_PROVIDER_SITE_OTHER): Payer: Medicaid Other | Admitting: Student

## 2022-01-19 ENCOUNTER — Other Ambulatory Visit: Payer: Self-pay

## 2022-01-19 ENCOUNTER — Encounter: Payer: Self-pay | Admitting: Student

## 2022-01-19 VITALS — BP 113/61 | HR 77 | Temp 97.8°F | Ht 67.5 in | Wt 209.7 lb

## 2022-01-19 DIAGNOSIS — M67911 Unspecified disorder of synovium and tendon, right shoulder: Secondary | ICD-10-CM

## 2022-01-19 MED ORDER — IBUPROFEN 800 MG PO TABS: 800.0000 mg | ORAL_TABLET | Freq: Three times a day (TID) | ORAL | 1 refills | Status: DC | PRN

## 2022-01-19 NOTE — Patient Instructions (Addendum)
Thank you, Ms.ZELIA YZAGUIRRE for allowing [place] to provide your care today. Today we discussed your shoulder pain.  This is likely due to a shoulder tendinopathy such as shoulder impingement.  I recommend that you rest the right shoulder as much as possible and limit overhead activities.  Applying ice to the area of the pain will give you some mild relief.  I have attached some shoulder exercises that you can do at home.  I have place a referrals to physical therapy  I have ordered the following medication/changed the following medications:  Advil 800 mg every 8 hours as needed for pain  My Chart Access: [email or web address]  Please follow-up as needed if your symptoms worsen.  Please make sure to arrive 15 minutes prior to your next appointment. If you arrive late, you may be asked to reschedule.    We look forward to seeing you next time. Please call our clinic at [phone] if you have any questions or concerns. The best time to call is Monday-Friday from 9am-4pm, but there is someone available 24/7. If after hours or the weekend, call the main hospital number and ask for the Internal Medicine Resident On-Call. If you need medication refills, please notify your pharmacy one week in advance and they will send [place] a request.   Thank you for letting us take part in your care. Wishing you the best!  Lacinda Axon, MD 01/19/2022, 10:28 AM IM Resident, PGY-2 [facility] 41:10

## 2022-01-19 NOTE — Progress Notes (Signed)
° °  CC: Right arm pain  HPI:  Tanya Austin is a 51 y.o. female with PMH as below who presents to clinic for evaluation of her right arm/shoulder pain. Please see problem based charting for evaluation, assessment and plan.  Past Medical History:  Diagnosis Date   Diabetes mellitus    Fibroid    Headache    Herpes    High cholesterol    History of COVID-19 09/10/2020   Hx of gonorrhea 2017    Review of Systems:  Constitutional: Negative for fever or fatigue Neck: Negative for neck pain or trauma MSK: Positive for right shoulder pain.  Negative for back pain or trauma Neuro: Negative for headache, dizziness, numbness, tingling or weakness  Physical Exam: General: Pleasant, well-appearing middle-age woman. No acute distress. Neck: Normal ROM.  No C-spine tenderness. Cardiac: RRR. No murmurs, rubs or gallops. No LE edema Respiratory: Lungs CTAB. No wheezing or crackles. MSK: Point tenderness to the glenohumeral joint.  No tenderness to the [facility] joint.  Limited ROM of the right arm/shoulder. Positive Neer's test, Hawkins-Kennedy test and Empty can test.  Skin: Warm, dry and intact without rashes or lesions Extremities: Atraumatic. Full ROM. 2+ radial pulses. Neuro: A&O x 3. Moves all extremities. Normal sensation to gross touch. Normal grip strength.  Strength 4/5 in right arm due to pain.  Vitals:   01/19/22 0920  BP: 113/61  Pulse: 77  Temp: 97.8 F (36.6 C)  TempSrc: Oral  SpO2: 100%  Weight: 209 lb 11.2 oz (95.1 kg)  Height: 5' 7.5" (1.715 m)     Assessment & Plan:   See Encounters Tab for problem based charting.  Patient discussed with Dr. Emelia Salisbury, MD, MPH

## 2022-01-20 ENCOUNTER — Encounter: Payer: Medicaid Other | Admitting: Internal Medicine

## 2022-01-20 ENCOUNTER — Encounter: Payer: Self-pay | Admitting: Student

## 2022-01-20 DIAGNOSIS — M67911 Unspecified disorder of synovium and tendon, right shoulder: Secondary | ICD-10-CM | POA: Insufficient documentation

## 2022-01-20 NOTE — Assessment & Plan Note (Signed)
Patient presents to clinic today for evaluation of right shoulder pain with difficulty lifting her right arm above her breast. 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 On Friday 2/17, she felt some soreness in both shoulders after work. The next day, she had worsening pain in her right shoulder. She denies any known trauma or sleeping awkwardly on that side. She describes the pain as localized to the front side of her right shoulder and 8/10 when she moves her arm. The pain is aching in nature and has made it difficult for her to lift her whole right arm above her head. She endorses some weakness of that right shoulder/arm but denies any numbness, tingling, falls, rash or headaches. On exam, patient she has point tenderness to the area of the glenohumeral joint. She has difficulty lifting her right arm above her head. The Neer's test, Hawkins-Kennedy test and Empty can test were all positive.  Her exam findings are consistent with rotator cuff tendinopathy such as impingement syndrome. Will manage with NSAIDs and physical therapy then consider imaging with ultrasound if symptoms do not improve.  Plan: -- PT referral -- Start ibuprofen 800 mg every 6 hours as needed for pain -- Continue Tylenol as needed for pain -- Advised to apply ice to the area as needed for relief -- Advised to call clinic for re-evaluation if PT does not improve her symptoms. -- Work note with restrictions given to patient until symptoms resolve -- Follow-up as needed

## 2022-01-21 NOTE — Progress Notes (Signed)
Internal Medicine Clinic Attending  Case discussed with Dr. Amponsah  At the time of the visit.  We reviewed the resident's history and exam and pertinent patient test results.  I agree with the assessment, diagnosis, and plan of care documented in the resident's note.  

## 2022-01-26 ENCOUNTER — Encounter: Payer: Self-pay | Admitting: Internal Medicine

## 2022-01-26 ENCOUNTER — Ambulatory Visit (INDEPENDENT_AMBULATORY_CARE_PROVIDER_SITE_OTHER): Payer: Medicaid Other | Admitting: Internal Medicine

## 2022-01-26 VITALS — BP 124/80 | HR 73 | Temp 98.6°F | Ht 67.5 in | Wt 213.1 lb

## 2022-01-26 DIAGNOSIS — M67911 Unspecified disorder of synovium and tendon, right shoulder: Secondary | ICD-10-CM

## 2022-01-26 DIAGNOSIS — E119 Type 2 diabetes mellitus without complications: Secondary | ICD-10-CM | POA: Diagnosis not present

## 2022-01-26 DIAGNOSIS — Z0000 Encounter for general adult medical examination without abnormal findings: Secondary | ICD-10-CM

## 2022-01-26 DIAGNOSIS — N9489 Other specified conditions associated with female genital organs and menstrual cycle: Secondary | ICD-10-CM

## 2022-01-26 MED ORDER — KETOROLAC TROMETHAMINE 30 MG/ML IJ SOLN
30.0000 mg | Freq: Once | INTRAMUSCULAR | Status: AC
  Administered 2022-01-26: 30 mg via INTRAMUSCULAR

## 2022-01-26 NOTE — Patient Instructions (Signed)
Tanya Austin  It was a pleasure seeing you in the clinic today.   We talked about your rotator cuff pain in your shoulder.   It is really important you you go to the physical therapist. They will be able to help improve your pain.  In the mean time we gave you an injection today that should help with the pain.  Schedule your ibuprofen. Take 800 mg three times a day. This will help to reduce the inflammation.  Please call our clinic at [phone] if you have any questions or concerns. The best time to call is Monday-Friday from 9am-4pm, but there is someone available 24/7 at the same number. If you need medication refills, please notify your pharmacy one week in advance and they will send [place] a request.   Thank you for letting us take part in your care. We look forward to seeing you next time!

## 2022-01-26 NOTE — Assessment & Plan Note (Signed)
Patient has her menses today, will plan to do a vaginal exam at follow up appointment to assess for irritation.  - consider topical estrogen therapy at next visit

## 2022-01-26 NOTE — Assessment & Plan Note (Signed)
Patient has not yet seen PT. Initially patient asked for an XR stating she wanted to know what was causing her shoulder pain. Based on examination and discussion pain is still consistent with rotator cuff tendonopathy. Counseled patient on the nature of the problem and the importance of following up with PT - exercises provided - toradol injection - scheduled 800 mg ibuprofen 3x daily - encouraged PT follow up - work note provided

## 2022-01-26 NOTE — Progress Notes (Signed)
° °  CC: shoulder pain  HPI:  [name] is a 51 y.o. PMH noted below, who presents to the [facility] with complaints of shoulder. To see the management of his acute and chronic conditions, please refer to the A&P note under the encounters tab.   Past Medical History:  Diagnosis Date   Diabetes mellitus    Fibroid    Headache    Herpes    High cholesterol    History of COVID-19 09/10/2020   Hx of gonorrhea 2017   Review of Systems:  positive for shoulder pain, difficulty sleeping  Physical Exam: Gen: middle aged woman in NAD HEENT: normocephalic atraumatic, MMM CV: RRR, no m/r/g   Resp: CTAB, normal WOB  GI: soft, nontender MSK: difficulty raising right arm above head, voluntary guarding. Slight tenderness to palpation over glenohumeral joint Skin:warm and dry Neuro:alert answering questions appropriately Psych: normal affect   Assessment & Plan:   See Encounters Tab for problem based charting.  Patient discussed with Dr. Evette Doffing

## 2022-01-26 NOTE — Assessment & Plan Note (Signed)
Encouraged patient to follow up with ophthalmologist

## 2022-01-26 NOTE — Assessment & Plan Note (Signed)
Declined COVID shot.

## 2022-01-27 NOTE — Progress Notes (Signed)
Internal Medicine Clinic Attending ° °Case discussed with Dr. DeMaio  At the time of the visit.  We reviewed the resident’s history and exam and pertinent patient test results.  I agree with the assessment, diagnosis, and plan of care documented in the resident’s note. ° ° °

## 2022-01-28 NOTE — Therapy (Addendum)
OUTPATIENT PHYSICAL THERAPY SHOULDER EVALUATION/discharge   Patient Name: Tanya Austin MRN: [id number] DOB:03-14-1971, 51 y.o., female Today's Date: 01/30/2022   PT End of Session - 01/30/22 0611     Visit Number 1    Number of Visits 7    Date for PT Re-Evaluation 03/20/22    Authorization Type [place] MEDICAID [employer]    PT Start Time 0845    PT Stop Time 0930    PT Time Calculation (min) 45 min    Activity Tolerance Patient tolerated treatment well;Patient limited by pain    Behavior During Therapy [facility] for tasks assessed/performed             Past Medical History:  Diagnosis Date   Diabetes mellitus    Fibroid    Headache    Herpes    High cholesterol    History of COVID-19 09/10/2020   Hx of gonorrhea 2017   Past Surgical History:  Procedure Laterality Date   CERVICAL BIOPSY     Patient Active Problem List   Diagnosis Date Noted   Tendinopathy of right rotator cuff 01/20/2022   Grieving 01/06/2022   Dizziness 01/06/2022   Anxiety 12/29/2021   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   Healthcare maintenance 11/20/2021   Vulvar burning 11/17/2021   Diabetes ([place]) 08/26/2021   Hyperlipidemia 08/26/2021   Plantar fasciitis 08/26/2021   Vaginal candidiasis 02/23/2017   Bacterial vaginosis 08/04/2016    PCP: Delene Ruffini, MD  REFERRING PROVIDER: Angelica Pou, MD  REFERRING DIAG: Tendinopathy of right shoulder  THERAPY DIAG:  Acute pain of right shoulder  Stiffness of right shoulder, not elsewhere classified   ONSET DATE: One week ago  SUBJECTIVE:                                                                                                                                                                                      SUBJECTIVE STATEMENT: Pt reports waking up one morning last week and not being able to lift her R arm higher than her chest due to pain. Pt notes she has received an injection and ibuprofen which has  helped the pain some It. Pt denies prior issue with the R shoulder.  PERTINENT HISTORY: DM  PAIN:  Are you having pain? Yes NPRS scale: 10/10,  Pain location: R shoulder Pain orientation: Right and Other: peri GH area   PAIN TYPE: aching and throbbing Pain description: constant  Aggravating factors: Lifting the arm Relieving factors: Nothing  PRECAUTIONS: None  WEIGHT BEARING RESTRICTIONS No  FALLS:  Has patient fallen in last 6 months? 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  PLOF: Independent  PATIENT GOALS : For the pain to decrease and to use her R arm better  OBJECTIVE:   DIAGNOSTIC FINDINGS:  NA  PATIENT SURVEYS:  Quick Dash  Shoulder to hand 79.5%   COGNITION:  Overall cognitive status: Within functional limits for tasks assessed     SENSATION:  Light touch: Appears intact  POSTURE: Forward head, rounded shoulder, decreased kyphosis, decreased lumbar lordosis  UPPER EXTREMITY AROM/PROM:  A/PROM Right 01/30/2022 Left 01/30/2022  Shoulder flexion 160 pain developed at 80d and continued throughout the motion 160  Shoulder extension    Shoulder abduction    Shoulder adduction    Shoulder internal rotation    Shoulder external rotation    Elbow flexion    Elbow extension    Wrist flexion    Wrist extension    Wrist ulnar deviation    Wrist radial deviation    Wrist pronation    Wrist supination    (Blank rows = not tested)  UPPER EXTREMITY MMT:  R UE strength testing was completed in a neutral position MMT Right 01/30/2022 Left 01/30/2022  Shoulder flexion 4+ 5  Shoulder extension 4+ 5  Shoulder abduction 4+ provoked pain 5  Shoulder adduction 4+ 5  Shoulder internal rotation 4+ 5  Shoulder external rotation 4+ 5  Middle trapezius    Lower trapezius    Elbow flexion    Elbow  extension    Wrist flexion    Wrist extension    Wrist ulnar deviation    Wrist radial deviation    Wrist pronation    Wrist supination    Grip strength (lbs)    (Blank rows = not tested)  SHOULDER SPECIAL TESTS:  Impingement tests: Hawkins/Kennedy impingement test: negative  Rotator cuff assessment: Empty can test: negative and Full can test: negative  Biceps assessment: Yergason's test: negative  PALPATION:  TTP to the lateral [place] area   TODAY'S TREATMENT:  Flexion-Extension Shoulder Pendulum with Table Support 2x10 Standing Shoulder Row with resistance 10 reps GTB Shoulder extension with resistance 10 reps GTB   PATIENT EDUCATION: Education details: Eavl findings, POC, HEP, recommendations for R UE support in sitting and sleeping Person educated: Patient Education method: Explanation, Demonstration, Tactile cues, Verbal cues, and Handouts Education comprehension: verbalized understanding, returned demonstration, verbal cues required, tactile cues required, and needs further education   HOME EXERCISE PROGRAM: Access Code: [email or web address] URL: [email or web address] Date: 01/30/2022 Prepared by: Gar Ponto  Exercises Flexion-Extension Shoulder Pendulum with Table Support - 3 x daily - 7 x weekly - 3 sets - 10 reps Standing Shoulder Row with Anchored Resistance - 3 x daily - 7 x weekly - 1 sets - 10 reps - 3 hold Shoulder extension with resistance - Neutral - 3 x daily - 7 x weekly - 1 sets - 10 reps   ASSESSMENT:  CLINICAL IMPRESSION: Patient is a 51 y.o. F who was seen today for physical therapy evaluation and treatment for Tendinopathy of right shoulder.    OBJECTIVE IMPAIRMENTS decreased ROM, decreased strength, impaired UE functional use, and pain. R shoulder pain was most significantly reproduced with elevation starting at approx 80d and continues throughout the motion and resisted abd in a neutral position  ACTIVITY LIMITATIONS cleaning, driving, meal  prep, occupation, laundry, and yard work.   PERSONAL FACTORS Profession, Time since onset of injury/illness/exacerbation, and  1 comorbidity: DM  are also affecting patient's functional outcome.    REHAB POTENTIAL: Good  CLINICAL DECISION MAKING: Stable/uncomplicated  EVALUATION COMPLEXITY: Low   GOALS:  SHORT TERM GOALS= LTGs   LONG TERM GOALS:  Pt will be Ind in a HEP to maintain achieved LOF Baseline: started on eval Target date: 03/20/22 Goal status: INITIAL  2.  Pt will demostrate full R shoulder AROM with a pain rating of 2/10 or less for improved R UE function Baseline: Flexion 160d= to R, 10/10 pain Target date: 03/20/22 Goal status: INITIAL  3.  Pt will demonstrate R shoulder strength of 5/5 c 2/10 pain or less for improved R UE  function  Baseline: 4+/5,10/10 Target date: 03/20/22 Goal status: INITIAL  4.  Pt will reports decreased R shoulder pain to 2/10 with daily activities for improved  UE function and QOL Baseline: 10/10 Target date: 03/20/22 Goal status: INITIAL  5.  Improve pt's quick dash score to MCID 63% Baseline: 79.5% Target date: 03/20/22 Goal status: INITIAL  PLAN: PT FREQUENCY: 1x/week  PT DURATION: 6 weeks  PLANNED INTERVENTIONS: Therapeutic exercises, Therapeutic activity, Neuromuscular re-education, Balance training, Gait training, Patient/Family education, Joint manipulation, Joint mobilization, Aquatic Therapy, Dry Needling, Electrical stimulation, Cryotherapy, Moist heat, Taping, Ultrasound, Ionotophoresis 4mg /ml Dexamethasone, and Manual therapy  PLAN FOR NEXT SESSION: Assess reponse to HEP, progress therex as indicated, use of modalities, manual therapy and TPDN as indicated  Lorell Thibodaux MS, PT 01/30/22 11:24 AM  Check all possible CPT codes: 97110- Therapeutic Exercise, 97112- Neuro Re-education, 97140 - Manual Therapy, 97530 - Therapeutic Activities, 97535 - Self Care, 97014 - Electrical stimulation (unattended), [id number] - Electrical  stimulation (Manual), [id number] - Iontophoresis, [id number] - Ultrasound, [id number] - Vaso, and [id number] - Aquatic therapy     If treatment provided at initial evaluation, no treatment charged due to lack of authorization.   PHYSICAL THERAPY DISCHARGE SUMMARY  Visits from Start of Care: 1  Current functional level related to goals / functional outcomes: unknown   Remaining deficits: unknown   Education / Equipment: HEP   Patient agrees to discharge. Patient goals were not met. Patient is being discharged due to not returning since the last visit.  Kilie Rund MS, PT 08/06/22 5:23 PM

## 2022-01-29 ENCOUNTER — Ambulatory Visit: Payer: Medicaid Other | Attending: Internal Medicine

## 2022-01-29 ENCOUNTER — Other Ambulatory Visit: Payer: Self-pay

## 2022-01-29 DIAGNOSIS — M25611 Stiffness of right shoulder, not elsewhere classified: Secondary | ICD-10-CM | POA: Insufficient documentation

## 2022-01-29 DIAGNOSIS — M67911 Unspecified disorder of synovium and tendon, right shoulder: Secondary | ICD-10-CM | POA: Insufficient documentation

## 2022-01-29 DIAGNOSIS — M25511 Pain in right shoulder: Secondary | ICD-10-CM | POA: Insufficient documentation

## 2022-02-02 ENCOUNTER — Other Ambulatory Visit: Payer: Self-pay

## 2022-02-02 ENCOUNTER — Other Ambulatory Visit (HOSPITAL_COMMUNITY)
Admission: RE | Admit: 2022-02-02 | Discharge: 2022-02-02 | Disposition: A | Discharge status: Home / Self Care | Payer: Medicaid Other | Source: Ambulatory Visit | Attending: Internal Medicine | Admitting: Internal Medicine

## 2022-02-02 ENCOUNTER — Encounter: Payer: Self-pay | Admitting: Internal Medicine

## 2022-02-02 ENCOUNTER — Ambulatory Visit (INDEPENDENT_AMBULATORY_CARE_PROVIDER_SITE_OTHER): Payer: Medicaid Other | Admitting: Internal Medicine

## 2022-02-02 VITALS — BP 127/59 | HR 66 | Temp 97.7°F | Ht 67.0 in | Wt 216.4 lb

## 2022-02-02 DIAGNOSIS — N9489 Other specified conditions associated with female genital organs and menstrual cycle: Secondary | ICD-10-CM | POA: Diagnosis not present

## 2022-02-02 DIAGNOSIS — M67911 Unspecified disorder of synovium and tendon, right shoulder: Secondary | ICD-10-CM

## 2022-02-02 MED ORDER — ESTROGENS CONJUGATED 0.625 MG/GM VA CREA: 1.0000 | TOPICAL_CREAM | Freq: Every day | VAGINAL | 12 refills | Status: DC

## 2022-02-02 NOTE — Assessment & Plan Note (Addendum)
Reports ongoing hx of vulvar and vagina burning. She states that the burning is constant, but is exacerbated with BV infections. Has been evaluated for this previously, and told it is related to her hx of herpes. She denies any current rash/ herpetic lesions. She has not had any discharge or odor recently either. Does report occasional scant blood, but none today. 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 She has tried suppressive therapy for BV, steroids, and lidocaine in the past, but reports no relief with these treatments. Most recently, OBGYN recommended pelvic PT if no relief from steroids or lidocaine.  ?Physical exam showed pale pink mucosa, scant white discharge, no obvious bleeding or lesions.  ?Swabbed for GC, chlamydia, BV, trich, and yeast. We will treat these accordingly pending lab results. She may need to be restarted on suppressive therapy. ?- Given pale pink mucosa, she would likely benefit from topical estrogen therapy. Will discuss with her OBGYN, as patient may need pelvic PT as well.  ?

## 2022-02-02 NOTE — Assessment & Plan Note (Signed)
Has been working with PT, first session was last week. She has also bee using scheduled ibuprofen. Notes some improvement. Discussed with her that benefit may take some time.  ?She is agreeable to continuing PT.  ?Moves arm freely, does not appear to be in pain. Able to support herself with arm.  ?No changes to current management. Continue scheduled ibuprofen and weekly PT.  ?

## 2022-02-02 NOTE — Progress Notes (Signed)
Internal Medicine Clinic Attending ° °Case discussed with Dr. Gawaluck  At the time of the visit.  We reviewed the resident’s history and exam and pertinent patient test results.  I agree with the assessment, diagnosis, and plan of care documented in the resident’s note.  °

## 2022-02-02 NOTE — Progress Notes (Signed)
? ?  CC: 1 week recheck tendinopathy right rotator cuff ? ?HPI:Tanya Austin is a 51 y.o. female who presents for evaluation of tendinopathy of right rotator cuff. Please see individual problem based A/P for details. ? ?Depression, PHQ-9: ?Based on the patients  ?[place] Office Visit from 02/02/2022 in [facility]  ?PHQ-9 Total Score 0  ? ?  ? score we have 0. ? ?Past Medical History:  ?Diagnosis Date  ? Diabetes mellitus   ? Fibroid   ? Headache   ? Herpes   ? High cholesterol   ? History of COVID-19 09/10/2020  ? Hx of gonorrhea 2017  ? ?Review of Systems:   ?Review of Systems  ?Constitutional: Negative.   ?HENT: Negative.    ?Eyes: Negative.   ?Respiratory: Negative.    ?Cardiovascular: Negative.   ?Gastrointestinal: Negative.   ?Genitourinary: Negative.   ?     Vulvar burning  ?Musculoskeletal:  Positive for joint pain.  ?Skin: Negative.   ?Neurological: Negative.   ?Psychiatric/Behavioral: Negative.     ? ?Physical Exam: ?Vitals:  ? 02/02/22 0849  ?BP: (!) 127/59  ?Pulse: 66  ?Temp: 97.7 ?F (36.5 ?C)  ?TempSrc: Oral  ?SpO2: 100%  ?Weight: 216 lb 6.4 oz (98.2 kg)  ?Height: '5\' 7"'$  (1.702 m)  ? ? ? ?General: alert and oriented ?HEENT: Conjunctiva nl , antiicteric sclerae, moist mucous membranes, no exudate or erythema ?Cardiovascular: Normal rate, regular rhythm.  No murmurs, rubs, or gallops ?Pulmonary : Equal breath sounds, No wheezes, rales, or rhonchi ?Abdominal: soft, nontender,  bowel sounds present ?GU: pale pink mucosa, scant white discharge. No blood or abnormal lesions noted. No rashes on external vulva. ?Ext: No edema in lower extremities, no tenderness to palpation of lower extremities.  ? ?Assessment & Plan:  ? ?See Encounters Tab for problem based charting. ? ?Patient discussed with Dr.  Cain Sieve ? ?

## 2022-02-02 NOTE — Patient Instructions (Addendum)
Dear Tanya Austin, ? ?Thank you for trusting [place] with your care today. 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 ? ?I did a pelvic exam and swabbed for several common diseases that can cause burning. I will call you with the results of the swab if If they return positive.  ?Additionally, I will prescribe a topical vaginal estrogen cream. Please also contact your OBGYN about pelvic physical therapy.  ? ?Please continue to work with the physical therapist for your shoulder as well. ?

## 2022-02-03 LAB — CERVICOVAGINAL ANCILLARY ONLY
Bacterial Vaginitis (gardnerella): NEGATIVE
Candida Glabrata: NEGATIVE
Candida Vaginitis: NEGATIVE
Chlamydia: NEGATIVE
Comment: NEGATIVE
Comment: NEGATIVE
Comment: NEGATIVE
Comment: NEGATIVE
Comment: NEGATIVE
Comment: NORMAL
Neisseria Gonorrhea: NEGATIVE
Trichomonas: NEGATIVE

## 2022-02-05 ENCOUNTER — Ambulatory Visit: Payer: Medicaid Other | Admitting: Physical Therapy

## 2022-02-05 NOTE — Therapy (Incomplete)
?OUTPATIENT PHYSICAL THERAPY TREATMENT NOTE ? ? ?Patient Name: Tanya Austin ?MRN: [id number] ?DOB:Jul 03, 1971, 51 y.o., female ?Today's Date: 02/05/2022 ? ?PCP: Delene Ruffini, MD ?REFERRING PROVIDER: Angelica Pou, MD ? ? ? ?Past Medical History:  ?Diagnosis Date  ? Diabetes mellitus   ? Fibroid   ? Headache   ? Herpes   ? High cholesterol   ? History of COVID-19 09/10/2020  ? Hx of gonorrhea 2017  ? ?Past Surgical History:  ?Procedure Laterality Date  ? CERVICAL BIOPSY    ? ?Patient Active Problem List  ? Diagnosis Date Noted  ? Tendinopathy of right rotator cuff 01/20/2022  ? Grieving 01/06/2022  ? Dizziness 01/06/2022  ? Anxiety 12/29/2021  ? 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 Healthcare maintenance 11/20/2021  ? Vulvar burning 11/17/2021  ? Diabetes ([place]) 08/26/2021  ? Hyperlipidemia 08/26/2021  ? Plantar fasciitis 08/26/2021  ? Vaginal candidiasis 02/23/2017  ? Bacterial vaginosis 08/04/2016  ? ? ?REFERRING PROVIDER: Angelica Pou, MD ?  ?REFERRING DIAG: Tendinopathy of right shoulder ? ?THERAPY DIAG:  ?No diagnosis found. ? ?PERTINENT HISTORY: DM ? ?PRECAUTIONS: None ? ?SUBJECTIVE: *** ? ?PAIN:  ?Are you having pain? Yes ?NPRS scale: 10/10  ?Pain location: R shoulder ?Pain orientation: Right and Other: peri [place] area   ?PAIN TYPE: aching and throbbing ?Pain description: constant  ?Aggravating factors: Lifting the arm ?Relieving factors: Nothing ? ?PATIENT GOALS : For the pain to decrease and to use her R arm better ? ? ?OBJECTIVE:  ?PATIENT SURVEYS:  ?Quick Dash  Shoulder to hand 79.5%  ?  ?POSTURE: ?Forward head, rounded shoulder, decreased kyphosis, decreased lumbar lordosis ?  ?UPPER EXTREMITY AROM/PROM: ?  ?A/PROM Right ?01/30/2022 Left ?01/30/2022  ?Shoulder flexion 160 pain developed at 80d and continued throughout the motion 160  ?Shoulder extension      ?Shoulder abduction      ?Shoulder adduction      ?Shoulder internal rotation      ?Shoulder external rotation      ?Elbow flexion       ?Elbow extension      ?Wrist flexion      ?Wrist extension      ?Wrist ulnar deviation      ?Wrist radial deviation      ?Wrist pronation      ?Wrist supination      ? ?UPPER EXTREMITY MMT: ?          R UE strength testing was completed in a neutral position ?MMT Right ?01/30/2022 Left ?01/30/2022  ?Shoulder flexion 4+ 5  ?Shoulder extension 4+ 5  ?Shoulder abduction 4+ provoked pain 5  ?Shoulder adduction 4+ 5  ?Shoulder internal rotation 4+ 5  ?Shoulder external rotation 4+ 5  ?Middle trapezius      ?Lower trapezius      ?Elbow flexion      ?Elbow extension      ?Wrist flexion      ?Wrist extension      ?Wrist ulnar deviation      ?Wrist radial deviation      ?Wrist pronation      ?Wrist supination      ?Grip strength (lbs)      ? ?SHOULDER SPECIAL TESTS: ?          Impingement tests: Hawkins/Kennedy impingement test: negative ?          Rotator cuff assessment: Empty can test: negative and Full can test: negative ?          Biceps  assessment: Yergason's test: negative ?  ?PALPATION:  ?TTP to the lateral [place] area ?           ? ?TODAY'S TREATMENT:  ?[facility] Adult PT Treatment:                                                DATE: 02/05/2022 ?Therapeutic Exercise: ?*** ?Manual Therapy: ?*** ? ? ?[place] Adult PT Treatment:                                                DATE: 01/29/2022 ?Therapeutic Exercise: ?Flexion-Extension Shoulder Pendulum with Table Support 2x10 ?Standing Shoulder Row with resistance 10 reps GTB ?Shoulder extension with resistance 10 reps GTB ?   ?PATIENT EDUCATION: ?Education details: Eavl findings, POC, HEP, recommendations for R UE support in sitting and sleeping ?Person educated: Patient ?Education method: Explanation, Demonstration, Tactile cues, Verbal cues, and Handouts ?Education comprehension: verbalized understanding, returned demonstration, verbal cues required, tactile cues required, and needs further education ?   ?HOME EXERCISE PROGRAM: ?Access Code: [email or web address] ?  ?  ?ASSESSMENT: ?CLINICAL  IMPRESSION: ?*** ? ?Patient is a 51 y.o. F who was seen today for physical therapy evaluation and treatment for Tendinopathy of right shoulder.  ?  ?  ?OBJECTIVE IMPAIRMENTS decreased ROM, decreased strength, impaired UE functional use, and pain. R shoulder pain was most significantly reproduced with elevation starting at approx 80d and continues throughout the motion and resisted abd in a neutral position ?  ?ACTIVITY LIMITATIONS cleaning, driving, meal prep, occupation, laundry, and yard work.  ?  ?PERSONAL FACTORS Profession, Time since onset of injury/illness/exacerbation, and 1 comorbidity: DM  are also affecting patient's functional outcome.  ?  ?  ?GOALS: ?SHORT TERM GOALS= LTGs ?   ?LONG TERM GOALS: ?  ?Pt will be Ind in a HEP to maintain achieved LOF ?Baseline: started on eval ?Target date: 03/20/22 ?Goal status: INITIAL ?  ?2.  Pt will demostrate full R shoulder AROM with a pain rating of 2/10 or less for improved R UE function ?Baseline: Flexion 160d= to R, 10/10 pain ?Target date: 03/20/22 ?Goal status: INITIAL ?  ?3.  Pt will demonstrate R shoulder strength of 5/5 c 2/10 pain or less for improved R UE  function  ?Baseline: 4+/5,10/10 ?Target date: 03/20/22 ?Goal status: INITIAL ?  ?4.  Pt will reports decreased R shoulder pain to 2/10 with daily activities for improved  UE function and QOL ?Baseline: 10/10 ?Target date: 03/20/22 ?Goal status: INITIAL ?  ?5.  Improve pt's quick dash score to MCID 63% ?Baseline: 79.5% ?Target date: 03/20/22 ?Goal status: INITIAL ?  ? ?PLAN: ?PT FREQUENCY: 1x/week ?  ?PT DURATION: 6 weeks ?  ?PLANNED INTERVENTIONS: Therapeutic exercises, Therapeutic activity, Neuromuscular re-education, Balance training, Gait training, Patient/Family education, Joint manipulation, Joint mobilization, Aquatic Therapy, Dry Needling, Electrical stimulation, Cryotherapy, Moist heat, Taping, Ultrasound, Ionotophoresis '4mg'$ /ml Dexamethasone, and Manual therapy ?  ?PLAN FOR NEXT SESSION: Assess reponse  to HEP, progress therex as indicated, use of modalities, manual therapy and TPDN as indicated ? ? ? ?Hilda Blades, PT, DPT, LAT, ATC ?02/05/22  7:33 AM ?Phone: [phone] ?Fax: [phone] ? ? ?  ? ?

## 2022-02-06 ENCOUNTER — Telehealth: Payer: Self-pay | Admitting: Physical Therapy

## 2022-02-06 NOTE — Telephone Encounter (Signed)
Attempted to contact patient due to missed appointment on 02/05/2022. Left VM informing patient of missed appointment, advised of next scheduled appointment, and reminder of attendance policy. ? ?Hilda Blades, PT, DPT, LAT, ATC ?02/06/22  7:56 AM ?Phone: [phone] ?Fax: [phone] ? ?

## 2022-02-19 ENCOUNTER — Encounter: Payer: Self-pay | Admitting: Internal Medicine

## 2022-02-19 ENCOUNTER — Telehealth: Payer: Self-pay

## 2022-02-19 NOTE — Telephone Encounter (Signed)
Return pt's call about her question on Premarin vaginal cream  - mailbox full, unable to leave a message. ?

## 2022-02-19 NOTE — Addendum Note (Signed)
Addended by: Delene Ruffini T on: 02/19/2022 05:35 PM ? ? Modules accepted: Orders ? ?

## 2022-02-19 NOTE — Telephone Encounter (Signed)
Question about using the conjugated estrogens (PREMARIN) vaginal cream. ? ?simvastatin (ZOCOR) 20 MG tablet, refill request @ [id number] - [place], [address] AT [place]. ?

## 2022-02-20 ENCOUNTER — Telehealth: Payer: Self-pay

## 2022-02-20 NOTE — Telephone Encounter (Signed)
Pt called about her message from yesterday   about her cream .. which I explained  that the Rn did try to call her back and I  also placed her on hold to try to reach [place] back but the line was busy I explained I will send Holley Raring another message to reach back out to her and she started to state she was going to work with a attitude I then explained I was trying to get her help from the RN she stated stated "I was not talking to you but you know what Fuck you bitch " I then said excuse me she then said :" yes I am speaking to you Bitch fuck you" and hung the phone up  ?

## 2022-03-05 ENCOUNTER — Other Ambulatory Visit: Payer: Self-pay | Admitting: Internal Medicine

## 2022-03-05 MED ORDER — SIMVASTATIN 20 MG PO TABS: 20.0000 mg | ORAL_TABLET | Freq: Every day | ORAL | 1 refills | Status: DC

## 2022-03-30 ENCOUNTER — Encounter: Payer: Self-pay | Admitting: Internal Medicine

## 2022-03-30 ENCOUNTER — Ambulatory Visit (INDEPENDENT_AMBULATORY_CARE_PROVIDER_SITE_OTHER): Payer: Medicaid Other | Admitting: Internal Medicine

## 2022-03-30 VITALS — BP 122/77 | HR 79 | Temp 98.2°F | Ht 67.0 in | Wt 212.7 lb

## 2022-03-30 DIAGNOSIS — M5431 Sciatica, right side: Secondary | ICD-10-CM | POA: Insufficient documentation

## 2022-03-30 DIAGNOSIS — G8929 Other chronic pain: Secondary | ICD-10-CM | POA: Diagnosis not present

## 2022-03-30 DIAGNOSIS — M67911 Unspecified disorder of synovium and tendon, right shoulder: Secondary | ICD-10-CM

## 2022-03-30 DIAGNOSIS — E119 Type 2 diabetes mellitus without complications: Secondary | ICD-10-CM | POA: Diagnosis not present

## 2022-03-30 DIAGNOSIS — M545 Low back pain, unspecified: Secondary | ICD-10-CM | POA: Diagnosis present

## 2022-03-30 LAB — POCT GLYCOSYLATED HEMOGLOBIN (HGB A1C): Hemoglobin A1C: 6.2 % — AB (ref 4.0–5.6)

## 2022-03-30 LAB — GLUCOSE, CAPILLARY: Glucose-Capillary: 141 mg/dL — ABNORMAL HIGH (ref 70–99)

## 2022-03-30 MED ORDER — CYCLOBENZAPRINE HCL 5 MG PO TABS: 5.0000 mg | ORAL_TABLET | Freq: Three times a day (TID) | ORAL | 0 refills | Status: DC | PRN

## 2022-03-30 MED ORDER — IBUPROFEN 800 MG PO TABS: 800.0000 mg | ORAL_TABLET | Freq: Three times a day (TID) | ORAL | 1 refills | Status: DC | PRN

## 2022-03-30 NOTE — Assessment & Plan Note (Signed)
Patient says that she had degenerative disc disease and has been having a flare of pain for the past two weeks. No trouble at rest, pain does not wake her up at night. Denies any numbness, tingling, weakness, or radiation of the pain. Unsure what to take for the pain, has had oxycodone in the past.  ? ?A/P ?Discussed that we generally do not do opioids for low back pain and patient was understanding. Given lack of radiculopathy symptoms recommend conservative management. ?- PT referral ?- Ibuprofen 800 TID PRN ?- flexeril 5 TID PRNl, counseled to avoid taking with xanax ?- heat therapy, massage therapy recommended ?-  ?

## 2022-03-30 NOTE — Progress Notes (Signed)
? ?  CC: back pain ? ?HPI: ? ?[name] is a 51 y.o. PMH noted below, who presents to the [facility] with complaints of back pain. To see the management of his acute and chronic conditions, please refer to the A&P note under the encounters tab.  ? ?Past Medical History:  ?Diagnosis Date  ? Diabetes mellitus   ? Fibroid   ? Headache   ? Herpes   ? High cholesterol   ? History of COVID-19 09/10/2020  ? Hx of gonorrhea 2017  ? ?Review of Systems:  positive for back pain, negative for radiculopathy, chest pain, shortness of breath ? ?Physical Exam: ?Gen: obese middle aged woman in NAD ?HEENT: normocephalic atraumatic, MMM, neck supple ?CV: RRR, no m/r/g   ?Resp: CTAB, normal WOB  ?GI: soft, nontender ?MSK: moves all extremities without difficulty, tenderness to palpation over the lumbar back ?Skin:warm and dry ?Neuro:alert answering questions appropriately, normal sensation, normal strength ?Psych: normal affect ? ? ?Assessment & Plan:  ? ?See Encounters Tab for problem based charting. ? ?Patient discussed with Dr. Philipp Ovens  ? ?

## 2022-03-30 NOTE — Assessment & Plan Note (Signed)
>>  ASSESSMENT AND PLAN FOR LOW BACK PAIN WITHOUT SCIATICA WRITTEN ON 03/30/2022  9:45 AM BY DEMAIO, ALEXA, MD  Patient says that she had degenerative disc disease and has been having a flare of pain for the past two weeks. No trouble at rest, pain does not wake her up at night. Denies any numbness, tingling, weakness, or radiation of the pain. Unsure what to take for the pain, has had oxycodone in the past.   A/P Discussed that we generally do not do opioids for low back pain and patient was understanding. Given lack of radiculopathy symptoms recommend conservative management. - PT referral - Ibuprofen 800 TID PRN - flexeril 5 TID PRNl, counseled to avoid taking with xanax - heat therapy, massage therapy recommended -

## 2022-03-30 NOTE — Assessment & Plan Note (Addendum)
A1c check today 6.2, remains at goal. Janumet has not been filled since January however patient says she has extra pills leftover from a previous fill and reports compliance. Encouraged patient to schedule an eye doctor appointment. ?- continue janumet ?- f/u in 6 months ?

## 2022-03-30 NOTE — Progress Notes (Signed)
Internal Medicine Clinic Attending ° °Case discussed with Dr. DeMaio  At the time of the visit.  We reviewed the resident’s history and exam and pertinent patient test results.  I agree with the assessment, diagnosis, and plan of care documented in the resident’s note. ° ° °

## 2022-03-30 NOTE — Assessment & Plan Note (Signed)
Saw PT once but has had trouble with work making it work with her schedule. Encouraged patient to follow back up. ?

## 2022-03-30 NOTE — Patient Instructions (Signed)
Simeon Craft ? ?It was a pleasure seeing you in the clinic today.  ? ?We talked about your back pain and your diabetes. ? ?Diabetes- continue taking your janumet and see our nutrition counselor to work on healthy eating to help with weight loss.  ? ?2. Back pain- take 800 mg of ibuprofen every 8 hours as needed for back pain. You can also take flexeril which is a muscle relaxer to see if it helps. However do not take this at the same time as your xanax and be careful not to drive while taking it as it can make you sleepy. I also will send another PT referral.  ? ?Please call our clinic at [phone] if you have any questions or concerns. The best time to call is Monday-Friday from 9am-4pm, but there is someone available 24/7 at the same number. If you need medication refills, please notify your pharmacy one week in advance and they will send [place] a request. ?  ?Thank you for letting us take part in your care. We look forward to seeing you next time! ? ?

## 2022-05-16 ENCOUNTER — Emergency Department (HOSPITAL_COMMUNITY)
Admission: EM | Admit: 2022-05-16 | Discharge: 2022-05-16 | Disposition: A | Discharge status: Home / Self Care | Payer: Medicaid Other | Attending: Emergency Medicine | Admitting: Emergency Medicine

## 2022-05-16 ENCOUNTER — Other Ambulatory Visit: Payer: Self-pay

## 2022-05-16 ENCOUNTER — Encounter (HOSPITAL_COMMUNITY): Payer: Self-pay

## 2022-05-16 DIAGNOSIS — R519 Headache, unspecified: Secondary | ICD-10-CM | POA: Diagnosis present

## 2022-05-16 DIAGNOSIS — J3489 Other specified disorders of nose and nasal sinuses: Secondary | ICD-10-CM | POA: Diagnosis not present

## 2022-05-16 MED ORDER — SODIUM CHLORIDE 0.9 % IV BOLUS
500.0000 mL | Freq: Once | INTRAVENOUS | Status: AC
  Administered 2022-05-16: 500 mL via INTRAVENOUS

## 2022-05-16 MED ORDER — PROCHLORPERAZINE EDISYLATE 10 MG/2ML IJ SOLN
10.0000 mg | Freq: Once | INTRAMUSCULAR | Status: AC
Start: 2022-05-16 — End: 2022-05-16
  Administered 2022-05-16: 10 mg via INTRAVENOUS
  Filled 2022-05-16: qty 2

## 2022-05-16 MED ORDER — DIPHENHYDRAMINE HCL 25 MG PO CAPS
25.0000 mg | ORAL_CAPSULE | Freq: Once | ORAL | Status: AC
  Administered 2022-05-16: 25 mg via ORAL
  Filled 2022-05-16: qty 1

## 2022-05-16 NOTE — ED Provider Notes (Signed)
[facility] EMERGENCY DEPARTMENT Provider Note   CSN: [id number] Arrival date & time: 05/16/22  4818     History  Chief Complaint  Patient presents with   Migraine    Tanya Austin is a 51 y.o. female.  51 year old female with a past medical history of migraines presents to the ED with a chief complaint of headaches for the past 4 days.  Patient describes a sensation as someone squeezing her entire head.  She also reports the headache is worse whenever she lays down flat.  She is also having some sinus pressure, congestion for the last couple days.  Has been taking Advil pain is relief without much improvement in symptoms.  She did try to take some Excedrin for headache which helped ease the pain however the pain later returned.  She does report a shooting sensation into her teeth, states "it is almost like they are tired ". No vision changes, no UE weakness, no neck rigidity.   The history is provided by the patient and medical records.  Migraine Associated symptoms include headaches. Pertinent negatives include no chest pain, no abdominal pain and no shortness of breath.       Home Medications Prior to Admission medications   Medication Sig Start Date End Date Taking? Authorizing Provider  acetaminophen (TYLENOL) 500 MG tablet Take 1,000 mg by mouth 2 (two) times daily as needed for moderate pain.   Yes [provider]  ALPRAZolam Duanne Moron) 1 MG tablet Take 1 mg by mouth at bedtime as needed.   Yes [provider]  Cholecalciferol (VITAMIN D3) 3000 units TABS Take 1 capsule by mouth daily.    Yes [provider]  conjugated estrogens (PREMARIN) vaginal cream Place 1 Applicatorful vaginally daily. 02/02/22  Yes Delene Ruffini, MD  cyclobenzaprine (FLEXERIL) 5 MG tablet Take 1 tablet (5 mg total) by mouth 3 (three) times daily as needed for muscle spasms. 03/30/22  Yes Demaio, Alexa, MD  fish oil-omega-3 fatty acids 1000 MG capsule Take  1 g by mouth daily.   Yes [provider]  ibuprofen (ADVIL) 800 MG tablet Take 1 tablet (800 mg total) by mouth every 8 (eight) hours as needed. 03/30/22  Yes Demaio, Alexa, MD  IRON PO Take 1 tablet by mouth daily. otc medication- not sure of dose   Yes [provider]  RESTASIS 0.05 % ophthalmic emulsion 1 drop 2 (two) times daily. 09/05/20  Yes [provider]  simvastatin (ZOCOR) 20 MG tablet Take 1 tablet (20 mg total) by mouth at bedtime. 03/05/22 06/03/22 Yes Delene Ruffini, MD  sitaGLIPtin-metformin (JANUMET) 50-1000 MG tablet Take 1 tablet by mouth 2 (two) times daily with a meal. 12/29/21 05/16/22 Yes Delene Ruffini, MD  valACYclovir (VALTREX) 500 MG tablet Take 1 tablet daily with no outbreak. Take 1 tablet BID x 3 days with an outbreak 10/27/21  Yes Donnamae Jude, MD  fluticasone [facility]) 50 MCG/ACT nasal spray SHAKE LIQUID AND USE 1 SPRAY IN EACH NOSTRIL IN THE MORNING AND AT BEDTIME 11/20/21   Gaylan Gerold, DO  Probiotic Product (PROBIOTIC DAILY PO) Take 1 capsule by mouth daily. Patient not taking: Reported on 11/14/2021    [provider]  triamcinolone ointment (KENALOG) 0.5 % Apply 1 application topically 2 (two) times daily. Bid x 4 wks, then qd x 4 wks, then qod x 4 wks, then 2x/wk 11/14/21   Donnamae Jude, MD  Insulin Glargine (TOUJEO SOLOSTAR) 300 UNIT/ML SOPN Inject 14 Units into  the skin at bedtime. Patient not taking: Reported on 09/10/2020  09/25/20  [provider]  promethazine (PHENERGAN) 25 MG tablet Take 1 tablet (25 mg total) by mouth every 6 (six) hours as needed for nausea or vomiting. Patient not taking: Reported on 09/10/2020 10/24/17 09/25/20  Gareth Morgan, MD      Allergies    Patient has no known allergies.    Review of Systems   Review of Systems  Constitutional:  Negative for chills and fever.  HENT:  Positive for rhinorrhea, sinus pressure and sore throat.   Eyes:  Negative for photophobia.   Respiratory:  Negative for shortness of breath.   Cardiovascular:  Negative for chest pain.  Gastrointestinal:  Negative for abdominal pain.  Genitourinary:  Negative for flank pain.  Neurological:  Positive for headaches. Negative for dizziness, facial asymmetry, speech difficulty, weakness and numbness.  All other systems reviewed and are negative.   Physical Exam Updated Vital Signs BP (!) 149/76   Pulse 61   Temp 98 F (36.7 C)   Resp 18   Ht '5\' 7"'$  (1.702 m)   Wt 96.5 kg   SpO2 100%   BMI 33.32 kg/m  Physical Exam Vitals and nursing note reviewed.  Constitutional:      General: She is not in acute distress.    Appearance: She is well-developed.  HENT:     Head: Normocephalic and atraumatic.     Mouth/Throat:     Pharynx: No oropharyngeal exudate.  Eyes:     Pupils: Pupils are equal, round, and reactive to light.  Cardiovascular:     Rate and Rhythm: Regular rhythm.     Heart sounds: Normal heart sounds.  Pulmonary:     Effort: Pulmonary effort is normal. No respiratory distress.     Breath sounds: Normal breath sounds.  Abdominal:     General: Bowel sounds are normal. There is no distension.     Palpations: Abdomen is soft.     Tenderness: There is no abdominal tenderness.  Musculoskeletal:        General: No tenderness or deformity.     Cervical back: Normal range of motion.     Right lower leg: No edema.     Left lower leg: No edema.  Skin:    General: Skin is warm and dry.  Neurological:     Mental Status: She is alert and oriented to person, place, and time.     Comments: Alert, oriented, thought content appropriate. Speech fluent without evidence of aphasia. Able to follow 2 step commands without difficulty.  Cranial Nerves:  II:  Peripheral visual fields grossly normal, pupils, round, reactive to light III,IV, VI: ptosis not present, extra-ocular motions intact bilaterally  V,VII: smile symmetric, facial light touch sensation equal VIII: hearing  grossly normal bilaterally  IX,X: midline uvula rise  XI: bilateral shoulder shrug equal and strong XII: midline tongue extension  Motor:  5/5 in upper and lower extremities bilaterally including strong and equal grip strength and dorsiflexion/plantar flexion Sensory: light touch normal in all extremities.  Cerebellar: normal finger-to-nose with bilateral upper extremities, pronator drift negative Gait: normal gait and balance       ED Results / Procedures / Treatments   Labs (all labs ordered are listed, but only abnormal results are displayed) Labs Reviewed - No data to display  EKG None  Radiology No results found.  Procedures Procedures    Medications Ordered in ED Medications  diphenhydrAMINE (BENADRYL) capsule 25 mg (25  mg Oral Given 05/16/22 1217)  prochlorperazine (COMPAZINE) injection 10 mg (10 mg Intravenous Given 05/16/22 1205)  sodium chloride 0.9 % bolus 500 mL (500 mLs Intravenous New Bag/Given 05/16/22 1219)    ED Course/ Medical Decision Making/ A&P                           Medical Decision Making Risk Prescription drug management.     This patient presents to the ED for concern of headache, this involves a number of treatment options, and is a complaint that carries with it a high risk of complications and morbidity.  The differential diagnosis includes CVA, intracranial, versus migraine.    Co morbidities: Discussed in HPI   Brief History:  Patient with a past medical history of migraines presents to the ED with a chief complaint of headaches which began 4 days ago.  Endorsing to generalized sharp pain throughout her head with radiation to her teeth.  No red flags such as weakness of upper extremities, vision changes, nausea, vomiting, neck rigidity.  EMR reviewed including pt PMHx, past surgical history and past visits to ER.   See HPI for more details   Lab Tests:  I ordered and independently interpreted labs.  The pertinent results  include:    N/A  Imaging Studies:  No imaging studies ordered for this patient    Cardiac Monitoring:  n/a   Medicines ordered:  I ordered medication including Benadryl, Compazine for headache cocktail Reevaluation of the patient after these medicines showed that the patient improved I have reviewed the patients home medicines and have made adjustments as needed   Social Determinants of Health:  The patient's social determinants of health were a factor in the care of this patient    Problem List / ED Course:  Patient here with headache for the past 4 days, also endorsing some sinus pressure, upper respiratory symptoms.  Neuro exam is benign, she ambulated to the ED with a steady gait.  Discussed likely recurrent migraine with a prior history of this.  Given headache cocktail.  After she was medicated, she is requesting discharge as she reports "my anxiety is through the roof ".  Her nose are within normal limits, she appears in no distress currently texting in the room.  I did discuss with her follow-up with PCP for may be some prophylactic treatment to prevent recurrent migraines patient requesting discharge at this time, patient stable for discharge.   Dispostion:  After consideration of the diagnostic results and the patients response to treatment, I feel that the patent would benefit from outpatient follow up with PCP.     Portions of this note were generated with [employer]. Dictation errors may occur despite best attempts at proofreading.   Final Clinical Impression(s) / ED Diagnoses Final diagnoses:  Bad headache    Rx / DC Orders ED Discharge Orders     None         Janeece Fitting, Hershal Coria 05/16/22 1258    Luna Fuse, MD 05/18/22 1702

## 2022-05-16 NOTE — ED Triage Notes (Signed)
Patient complains of headache x 4 days, describes as squeezing sensation. Reports runny nose yesterday, no cough, no fever

## 2022-05-16 NOTE — Discharge Instructions (Addendum)
Lease follow-up with your primary care physician as scheduled.

## 2022-05-19 ENCOUNTER — Encounter: Payer: Medicaid Other | Admitting: Dietician

## 2022-06-08 IMAGING — CR DG CHEST 2V
2 series · 2 of 2 positions shown · non-contrast
Comparison: 09/12/2012

CLINICAL DATA: Cough, shortness of breath, headaches, and nausea.
[id number] positive last month.

EXAM:
CHEST - 2 VIEW

[w chest pa]
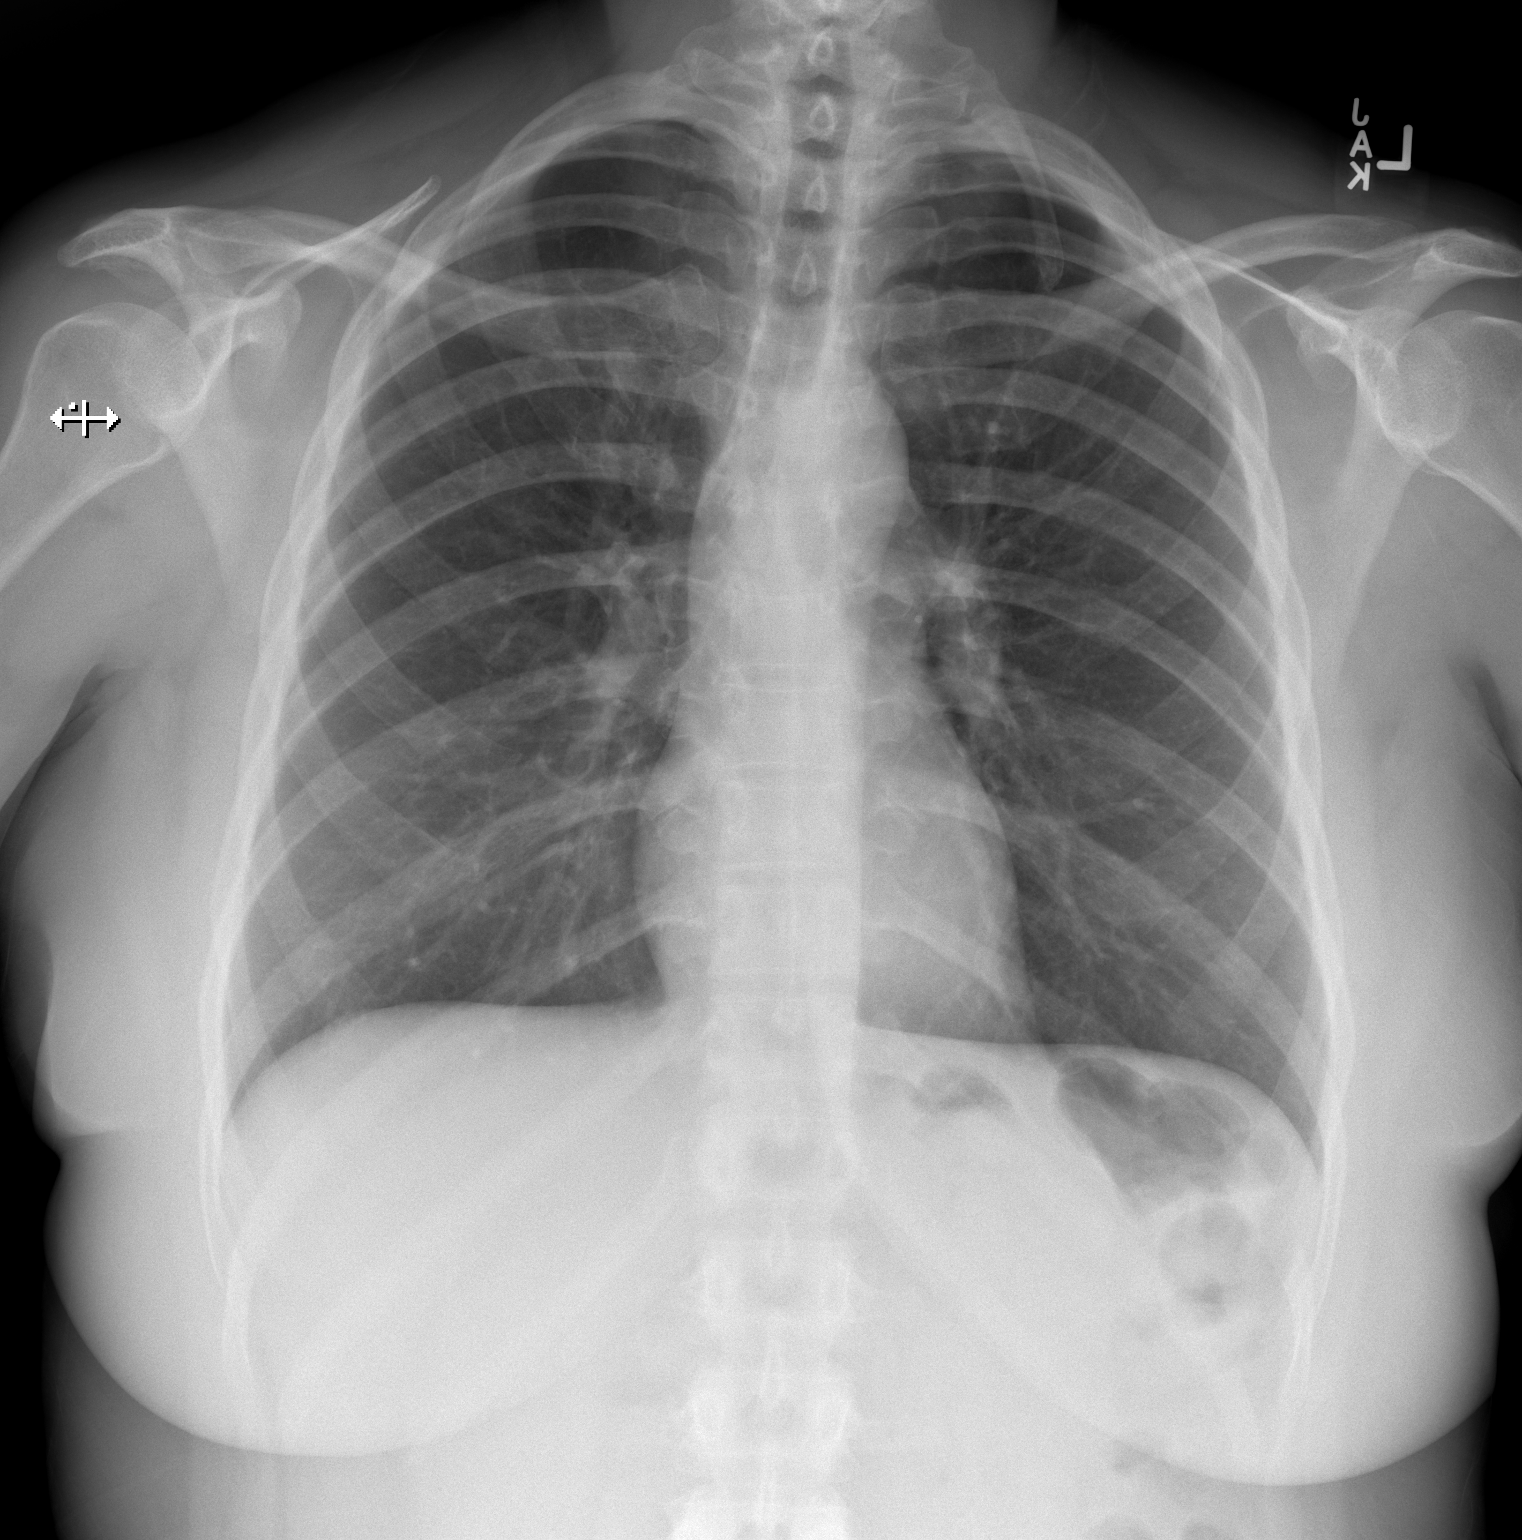

[w chest lat]
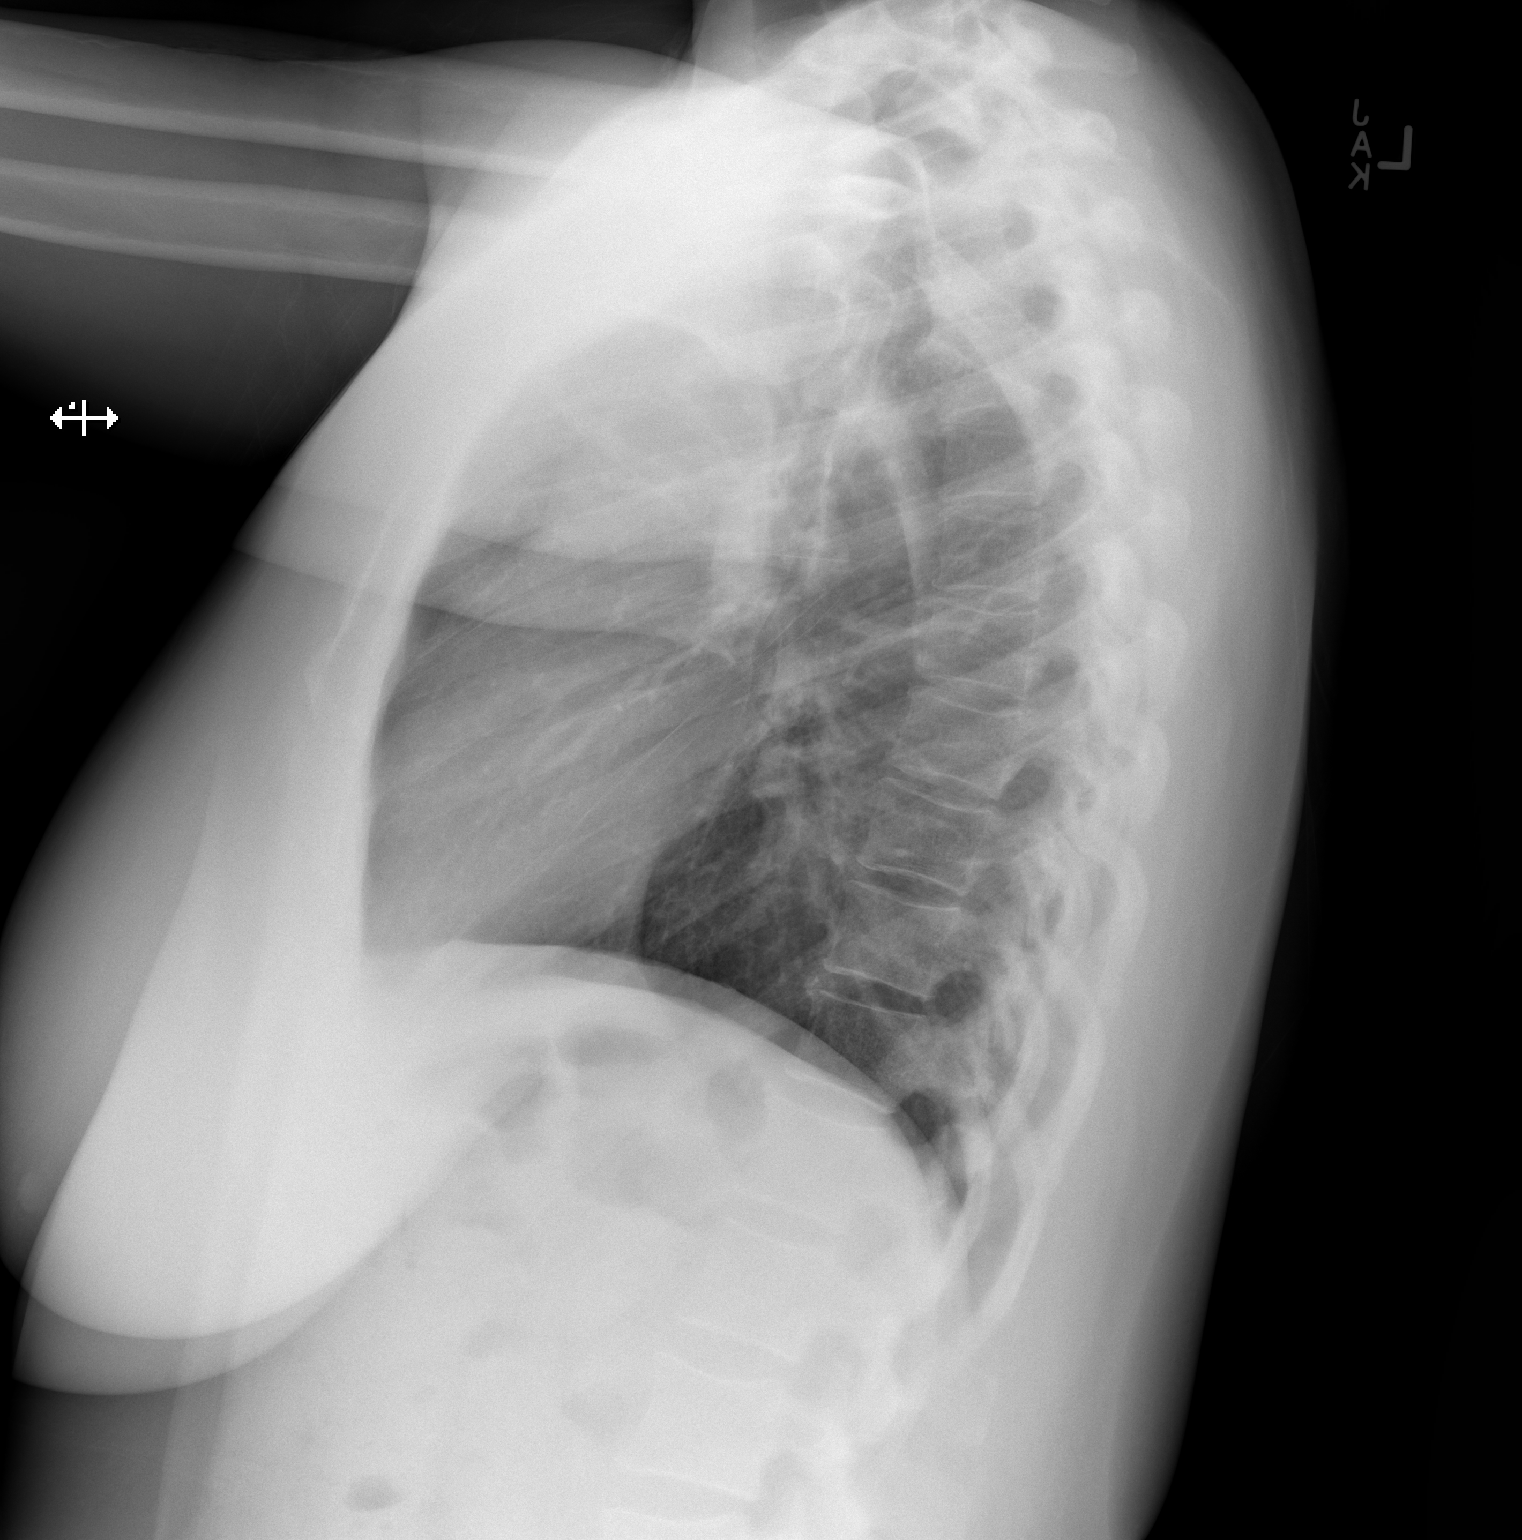

[2 of 2 positions shown; findings below may reference images not displayed]

FINDINGS: The cardiomediastinal silhouette is within normal limits. The lungs
are well inflated and clear. There is no evidence of pleural
effusion or pneumothorax. No acute osseous abnormality is
identified.
IMPRESSION: No active cardiopulmonary disease.

## 2022-06-16 ENCOUNTER — Other Ambulatory Visit: Payer: Self-pay

## 2022-06-16 DIAGNOSIS — E119 Type 2 diabetes mellitus without complications: Secondary | ICD-10-CM

## 2022-06-22 ENCOUNTER — Other Ambulatory Visit: Payer: Self-pay | Admitting: *Deleted

## 2022-06-22 DIAGNOSIS — E119 Type 2 diabetes mellitus without complications: Secondary | ICD-10-CM

## 2022-06-22 MED ORDER — JANUMET 50-1000 MG PO TABS: 1.0000 | ORAL_TABLET | Freq: Two times a day (BID) | ORAL | 3 refills | Status: DC

## 2022-06-24 ENCOUNTER — Ambulatory Visit (INDEPENDENT_AMBULATORY_CARE_PROVIDER_SITE_OTHER): Payer: Medicaid Other | Admitting: Student

## 2022-06-24 ENCOUNTER — Telehealth: Payer: Self-pay

## 2022-06-24 VITALS — BP 116/68 | HR 72 | Temp 98.2°F | Ht 67.0 in | Wt 211.3 lb

## 2022-06-24 DIAGNOSIS — E119 Type 2 diabetes mellitus without complications: Secondary | ICD-10-CM

## 2022-06-24 DIAGNOSIS — R109 Unspecified abdominal pain: Secondary | ICD-10-CM

## 2022-06-24 DIAGNOSIS — H9201 Otalgia, right ear: Secondary | ICD-10-CM

## 2022-06-24 DIAGNOSIS — Z7984 Long term (current) use of oral hypoglycemic drugs: Secondary | ICD-10-CM

## 2022-06-24 HISTORY — DX: Unspecified abdominal pain: R10.9

## 2022-06-24 HISTORY — DX: Otalgia, right ear: H92.01

## 2022-06-24 LAB — POCT GLYCOSYLATED HEMOGLOBIN (HGB A1C): Hemoglobin A1C: 6.2 % — AB (ref 4.0–5.6)

## 2022-06-24 LAB — GLUCOSE, CAPILLARY: Glucose-Capillary: 107 mg/dL — ABNORMAL HIGH (ref 70–99)

## 2022-06-24 NOTE — Progress Notes (Signed)
CC: R Ear pain  HPI:  Tanya Austin is a 51 y.o. female living with a history stated below and presents today for pain in her right ear and stomach. Please see problem based assessment and plan for additional details.  Past Medical History:  Diagnosis Date   Diabetes mellitus    Fibroid    Headache    Herpes    High cholesterol    History of COVID-19 09/10/2020   Hx of gonorrhea 2017    Current Outpatient Medications on File Prior to Visit  Medication Sig Dispense Refill   acetaminophen (TYLENOL) 500 MG tablet Take 1,000 mg by mouth 2 (two) times daily as needed for moderate pain.     ALPRAZolam (XANAX) 1 MG tablet Take 1 mg by mouth at bedtime as needed.     Cholecalciferol (VITAMIN D3) 3000 units TABS Take 1 capsule by mouth daily.      conjugated estrogens (PREMARIN) vaginal cream Place 1 Applicatorful vaginally daily. 42.5 g 12   cyclobenzaprine (FLEXERIL) 5 MG tablet Take 1 tablet (5 mg total) by mouth 3 (three) times daily as needed for muscle spasms. 30 tablet 0   fish oil-omega-3 fatty acids 1000 MG capsule Take 1 g by mouth daily.     fluticasone (FLONASE) 50 MCG/ACT nasal spray SHAKE LIQUID AND USE 1 SPRAY IN EACH NOSTRIL IN THE MORNING AND AT BEDTIME 48 g 0   ibuprofen (ADVIL) 800 MG tablet Take 1 tablet (800 mg total) by mouth every 8 (eight) hours as needed. 30 tablet 1   IRON PO Take 1 tablet by mouth daily. otc medication- not sure of dose     Probiotic Product (PROBIOTIC DAILY PO) Take 1 capsule by mouth daily. (Patient not taking: Reported on 11/14/2021)     RESTASIS 0.05 % ophthalmic emulsion 1 drop 2 (two) times daily.     simvastatin (ZOCOR) 20 MG tablet Take 1 tablet (20 mg total) by mouth at bedtime. 180 tablet 1   sitaGLIPtin-metformin (JANUMET) 50-1000 MG tablet Take 1 tablet by mouth 2 (two) times daily with a meal. 60 tablet 3   triamcinolone ointment (KENALOG) 0.5 % Apply 1 application topically 2 (two) times daily. Bid x 4 wks, then qd x 4 wks,  then qod x 4 wks, then 2x/wk 30 g 0   valACYclovir (VALTREX) 500 MG tablet Take 1 tablet daily with no outbreak. Take 1 tablet BID x 3 days with an outbreak 90 tablet 3   [DISCONTINUED] Insulin Glargine (TOUJEO SOLOSTAR) 300 UNIT/ML SOPN Inject 14 Units into the skin at bedtime. (Patient not taking: Reported on 09/10/2020)     [DISCONTINUED] promethazine (PHENERGAN) 25 MG tablet Take 1 tablet (25 mg total) by mouth every 6 (six) hours as needed for nausea or vomiting. (Patient not taking: Reported on 09/10/2020) 30 tablet 0   No current facility-administered medications on file prior to visit.    Family History  Problem Relation Age of Onset   Hypertension Other    Hyperlipidemia Other    Heart disease Other    Hypertension Mother    Diabetes Mother    Hypertension Father     Social History   Socioeconomic History   Marital status: Single    Spouse name: Not on file   Number of children: Not on file   Years of education: Not on file   Highest education level: Not on file  Occupational History   Not on file  Tobacco Use   Smoking status:  Former    Types: Cigarettes    Quit date: 06/09/2000    Years since quitting: 22.0   Smokeless tobacco: Never  Vaping Use   Vaping Use: Never used  Substance and Sexual Activity   Alcohol use: No    Comment: Socially on weekends   Drug use: No    Types: Marijuana   Sexual activity: Yes    Birth control/protection: Pill    Comment: Trinessa  Other Topics Concern   Not on file  Social History Narrative   Not on file   Social Determinants of Health   Financial Resource Strain: Not on file  Food Insecurity: Not on file  Transportation Needs: Not on file  Physical Activity: Not on file  Stress: Not on file  Social Connections: Not on file  Intimate Partner Violence: Not on file    Review of Systems: ROS negative except for what is noted on the assessment and plan.  Vitals:   06/24/22 0917  BP: 116/68  Pulse: 72  Temp: 98.2  F (36.8 C)  TempSrc: Oral  SpO2: 100%  Weight: 211 lb 4.8 oz (95.8 kg)  Height: '5\' 7"'$  (1.702 m)    Physical Exam: Constitutional: well-appearing, congested woman sitting comfortably, in no acute distress HENT: normocephalic atraumatic, mucous membranes moist, R ear is non-erythematous, ear canal clear without discharge, hearing is intact. Cardiovascular: regular rate and rhythm, no m/r/g Pulmonary/Chest: normal work of breathing on room air, lungs clear to auscultation bilaterally Abdominal: soft, non-tender, non-distended, non-erythematous  Assessment & Plan:   Ear pain, right Pt has been complaining of right ear pain for the last 3 days.  She states this happened once before, but it was not as bad as the pain she is feeling now.  When she pushes the ear up, the pain goes away.  The pain radiates down the right side of her jaw, and she says that it is very painful when she is using the right side of her mouth to chew. She has a history of sinusitis, and on inspection patient is clearly congested.  She woke up with a headache this morning.  She has tried Tylenol and Advil, however they only provided minimal relief.  She denies any trauma to the ear.  She denies any drainage coming from the ear as well.  She denies any fever, nausea, vomiting, auditory deficits, visual deficits.  On physical exam, there is no drainage coming from the ear, eardrum is intact, no erythema is noted around or inside the ear.  There is also poor dentition.  Plan:   - This may likely be a repercussion of her chronic sinusitis.  She also has an extensive allergy history. Recommended supportive treatment such as Flonase, Allegra, and using a Nettie pot.  - Recommended to patient that if the pain persist, she should see a dentist for TMJ evaluation.      Diabetes ([place]) A1c was checked today, and revealed 6.2.  A1c remains unchanged from her previous level in May.  Plan:  - We will discuss medications and  diet/exercise tips next appointment.  Stomach pain For the past 2 months, patient has had pain in her umbilicus region.  She states she was constipated last month, but has not had an up another episode of it since.  She says the pain comes and goes, with no exacerbating factors.  She says sometimes the soreness can get up to about an 8 out of 10.  She states she has 2-3 bowel movements  a day.  She denies any diarrhea, nausea, vomiting, trauma to the area.  She has not taken anything to help with the pain.  On physical exam umbilical cysts is not erythematous, or tender to palpation.  Plan: Recommended patient take Tylenol, Aleve to help get rid of the pain.  Patient seen with Dr. Kelle Darting Aubryanna Nesheim, M.D. [facility], PGY-1 Phone: [phone] Date 06/24/2022 Time 6:32 PM

## 2022-06-24 NOTE — Assessment & Plan Note (Signed)
For the past 2 months, patient has had pain in her umbilicus region.  She states she was constipated last month, but has not had an up another episode of it since.  She says the pain comes and goes, with no exacerbating factors.  She says sometimes the soreness can get up to about an 8 out of 10.  She states she has 2-3 bowel movements a day.  She denies any diarrhea, nausea, vomiting, trauma to the area.  She has not taken anything to help with the pain.  On physical exam umbilical cysts is not erythematous, or tender to palpation.  Plan: Recommended patient take Tylenol, Aleve to help get rid of the pain.

## 2022-06-24 NOTE — Patient Instructions (Signed)
Thank you so much for coming to the clinic today was a pleasure meeting you!  We talked about a few things here is a quick summary:  1.  Please continue taking Allegra, and pick up Flonase from the pharmacy.  Hopefully they will offer better control of your allergies, and combat your symptoms that you are having right now.  Also a Nettie pot I believe will help you a lot as well. 2.  We are also checking your sugar level today, so whenever the results come back we will give you a call. 3.  If you are still having the ear pain in about a month, please make an appointment with a dentist.  4.  For your bellybutton, if the pain starts getting worse please come back and see [place].  If you have any questions please call the clinic at [phone].  Best, Dr. Marlene Lard Janara Klett

## 2022-06-24 NOTE — Telephone Encounter (Signed)
Pa for pt ( JANUMET ) came through on cover my meds ... Was submitted with last office notes and labs ... Awaiting  approval or denial

## 2022-06-24 NOTE — Assessment & Plan Note (Signed)
Pt has been complaining of right ear pain for the last 3 days.  She states this happened once before, but it was not as bad as the pain she is feeling now.  When she pushes the ear up, the pain goes away.  The pain radiates down the right side of her jaw, and she says that it is very painful when she is using the right side of her mouth to chew. She has a history of sinusitis, and on inspection patient is clearly congested.  She woke up with a headache this morning.  She has tried Tylenol and Advil, however they only provided minimal relief.  She denies any trauma to the ear.  She denies any drainage coming from the ear as well.  She denies any fever, nausea, vomiting, auditory deficits, visual deficits.  On physical exam, there is no drainage coming from the ear, eardrum is intact, no erythema is noted around or inside the ear.  There is also poor dentition.  Plan:   - This may likely be a repercussion of her chronic sinusitis.  She also has an extensive allergy history. Recommended supportive treatment such as Flonase, Allegra, and using a Nettie pot.  - Recommended to patient that if the pain persist, she should see a dentist for TMJ evaluation.

## 2022-06-24 NOTE — Assessment & Plan Note (Signed)
A1c was checked today, and revealed 6.2.  A1c remains unchanged from her previous level in May.  Plan:  - We will discuss medications and diet/exercise tips next appointment.

## 2022-06-24 NOTE — Telephone Encounter (Signed)
DECISION:    Approved today   Request Reference Number: [id number].    JANUMET TAB 50-1000 is approved through 06/25/2023.    For further questions, call [employer] at [phone].   Drug Janumet 50-'1000MG'$  tablets   Form [employer] Electronic Prior Authorization Form (2017 NCPDP)         ( COPY SENT TO PHARMACY ALSO )

## 2022-06-25 NOTE — Progress Notes (Signed)
Internal Medicine Clinic Attending  I saw and evaluated the patient.  I personally confirmed the key portions of the history and exam documented by Dr. Nooruddin and I reviewed pertinent patient test results.  The assessment, diagnosis, and plan were formulated together and I agree with the documentation in the resident's note.  

## 2022-07-29 ENCOUNTER — Encounter: Payer: Medicaid Other | Admitting: Internal Medicine

## 2022-09-08 ENCOUNTER — Encounter: Payer: Self-pay | Admitting: Dietician

## 2022-09-08 LAB — HM DIABETES EYE EXAM

## 2022-09-17 ENCOUNTER — Other Ambulatory Visit (HOSPITAL_COMMUNITY)
Admission: RE | Admit: 2022-09-17 | Discharge: 2022-09-17 | Disposition: A | Discharge status: Home / Self Care | Payer: Medicaid Other | Source: Ambulatory Visit | Attending: Student in an Organized Health Care Education/Training Program | Admitting: Student in an Organized Health Care Education/Training Program

## 2022-09-17 ENCOUNTER — Telehealth: Payer: Self-pay

## 2022-09-17 ENCOUNTER — Ambulatory Visit (INDEPENDENT_AMBULATORY_CARE_PROVIDER_SITE_OTHER): Payer: Medicaid Other | Admitting: Student in an Organized Health Care Education/Training Program

## 2022-09-17 DIAGNOSIS — B9689 Other specified bacterial agents as the cause of diseases classified elsewhere: Secondary | ICD-10-CM | POA: Diagnosis present

## 2022-09-17 DIAGNOSIS — N760 Acute vaginitis: Secondary | ICD-10-CM | POA: Insufficient documentation

## 2022-09-17 NOTE — Telephone Encounter (Signed)
metroNIDAZOLE (METROGEL VAGINAL) 0.75 % vaginal gel

## 2022-09-17 NOTE — Telephone Encounter (Signed)
Looks she has had multiple issues causing burning including vaginal atrophy, pelvic PT, and bacterial vaginosis. Would need to evaluate her prior to prescribing her antibiotics.

## 2022-09-17 NOTE — Telephone Encounter (Signed)
I spoke with Tanya Austin. Clinic full today. Patient will do a nurse visit, self swab, and will treat if positive again for BV.

## 2022-09-17 NOTE — Telephone Encounter (Signed)
Patient walk in requesting refill on Metronidazole.  States has frequent issues .  Burning has White.discharge .  Has had discharge for 2-3 days.  Worsening.

## 2022-09-18 ENCOUNTER — Telehealth: Payer: Self-pay | Admitting: *Deleted

## 2022-09-18 LAB — CERVICOVAGINAL ANCILLARY ONLY
Bacterial Vaginitis (gardnerella): NEGATIVE
Candida Glabrata: NEGATIVE
Candida Vaginitis: NEGATIVE
Chlamydia: NEGATIVE
Comment: NEGATIVE
Comment: NEGATIVE
Comment: NEGATIVE
Comment: NEGATIVE
Comment: NEGATIVE
Comment: NORMAL
Neisseria Gonorrhea: NEGATIVE
Trichomonas: NEGATIVE

## 2022-09-18 NOTE — Telephone Encounter (Signed)
Per lab staff, result may be back late this afternoon.

## 2022-09-18 NOTE — Telephone Encounter (Signed)
Patient called in stating Rx for BV is not at pharmacy. Explained results of self swab are not back. Spoke with lab staff who will call for expected time of results. Patient is hoping to be treated prior to the weekend.

## 2022-09-18 NOTE — Telephone Encounter (Signed)
RTC to patient given results.  Negative for Culture.  Patient voiced understanding of and is pleased .  Does not want to do anything else right now.

## 2022-09-18 NOTE — Progress Notes (Signed)
Walk in person with concerns of possible recurrent bacterial vaginitis. Patient completed a self swab which was negative for BV. No indication for treatment at this time. If she is having further symptoms, we will offer her an acute visit or she can follow up with gynecology.

## 2022-09-18 NOTE — Telephone Encounter (Signed)
Results are normal. Tanya Austin will call the patient to follow up.

## 2022-09-24 ENCOUNTER — Ambulatory Visit: Payer: Medicaid Other

## 2022-09-29 ENCOUNTER — Ambulatory Visit (INDEPENDENT_AMBULATORY_CARE_PROVIDER_SITE_OTHER): Payer: Medicaid Other

## 2022-09-29 ENCOUNTER — Other Ambulatory Visit (HOSPITAL_COMMUNITY)
Admission: RE | Admit: 2022-09-29 | Discharge: 2022-09-29 | Disposition: A | Discharge status: Home / Self Care | Payer: Medicaid Other | Source: Ambulatory Visit | Attending: Family Medicine | Admitting: Family Medicine

## 2022-09-29 VITALS — BP 133/72 | HR 66 | Wt 202.7 lb

## 2022-09-29 DIAGNOSIS — N898 Other specified noninflammatory disorders of vagina: Secondary | ICD-10-CM | POA: Insufficient documentation

## 2022-09-29 NOTE — Progress Notes (Signed)
Pt presents today for self swab. Pt has complaints of vaginal itching, and discharge. Pt educated in how to perform self swab. Swab obtained. Pt informed results would come back in 24-48 hours and she would be notified via [place]. Pt verbalized understanding and denied further questions.

## 2022-09-30 LAB — CERVICOVAGINAL ANCILLARY ONLY
Bacterial Vaginitis (gardnerella): NEGATIVE
Candida Glabrata: NEGATIVE
Candida Vaginitis: NEGATIVE
Chlamydia: NEGATIVE
Comment: NEGATIVE
Comment: NEGATIVE
Comment: NEGATIVE
Comment: NEGATIVE
Comment: NEGATIVE
Comment: NORMAL
Neisseria Gonorrhea: NEGATIVE
Trichomonas: NEGATIVE

## 2022-10-06 ENCOUNTER — Encounter: Payer: Self-pay | Admitting: Dietician

## 2022-10-16 ENCOUNTER — Other Ambulatory Visit: Payer: Self-pay | Admitting: General Practice

## 2022-10-16 DIAGNOSIS — B009 Herpesviral infection, unspecified: Secondary | ICD-10-CM

## 2022-10-16 MED ORDER — VALACYCLOVIR HCL 500 MG PO TABS: ORAL_TABLET | ORAL | 3 refills | Status: DC

## 2022-12-04 ENCOUNTER — Telehealth: Payer: Self-pay | Admitting: Internal Medicine

## 2022-12-09 NOTE — Telephone Encounter (Signed)
Patient sent message via my chart to contact our office to schedule her 6 months checkup.

## 2023-01-27 ENCOUNTER — Other Ambulatory Visit: Payer: Self-pay | Admitting: Internal Medicine

## 2023-01-27 DIAGNOSIS — Z1231 Encounter for screening mammogram for malignant neoplasm of breast: Secondary | ICD-10-CM

## 2023-03-23 ENCOUNTER — Ambulatory Visit
Admission: RE | Admit: 2023-03-23 | Discharge: 2023-03-23 | Disposition: A | Discharge status: Home / Self Care | Payer: Medicaid Other | Source: Ambulatory Visit | Attending: Internal Medicine | Admitting: Internal Medicine

## 2023-03-23 DIAGNOSIS — Z1231 Encounter for screening mammogram for malignant neoplasm of breast: Secondary | ICD-10-CM

## 2023-04-28 ENCOUNTER — Encounter: Payer: Self-pay | Admitting: Student

## 2023-04-28 ENCOUNTER — Ambulatory Visit (INDEPENDENT_AMBULATORY_CARE_PROVIDER_SITE_OTHER): Payer: Medicaid Other | Admitting: Student

## 2023-04-28 VITALS — BP 130/79 | HR 62 | Temp 98.3°F | Ht 67.25 in | Wt 196.1 lb

## 2023-04-28 DIAGNOSIS — E119 Type 2 diabetes mellitus without complications: Secondary | ICD-10-CM

## 2023-04-28 DIAGNOSIS — B86 Scabies: Secondary | ICD-10-CM | POA: Diagnosis not present

## 2023-04-28 DIAGNOSIS — L308 Other specified dermatitis: Secondary | ICD-10-CM

## 2023-04-28 LAB — POCT GLYCOSYLATED HEMOGLOBIN (HGB A1C): Hemoglobin A1C: 6.3 % — AB (ref 4.0–5.6)

## 2023-04-28 LAB — GLUCOSE, CAPILLARY: Glucose-Capillary: 76 mg/dL (ref 70–99)

## 2023-04-28 MED ORDER — HYDROXYZINE HCL 10 MG PO TABS
10.0000 mg | ORAL_TABLET | Freq: Three times a day (TID) | ORAL | 0 refills | Status: DC | PRN
Start: 2023-04-28 — End: 2023-06-11

## 2023-04-28 MED ORDER — JANUMET 50-1000 MG PO TABS
1.0000 | ORAL_TABLET | Freq: Every day | ORAL | 0 refills | Status: DC
Start: 2023-04-28 — End: 2023-08-17

## 2023-04-28 MED ORDER — PERMETHRIN 5 % EX CREA
1.0000 | TOPICAL_CREAM | Freq: Once | CUTANEOUS | 0 refills | Status: AC
Start: 2023-04-28 — End: 2023-04-28

## 2023-04-28 NOTE — Assessment & Plan Note (Signed)
Patient presenting for diabetes follow up. She has been adherent Janumet. Denies polydipsia, polyuria. Diet: Exercise Last A1c:  Lab Results  Component Value Date   HGBA1C 6.2 (A) 06/24/2022   Foot exam today without ulcerations and intact proprioception. She is up-to-date with eye exam.

## 2023-04-28 NOTE — Assessment & Plan Note (Addendum)
Patient presents to clinic with prutitic rash that started after her son's. It is characterized by intense prurituc with small papules that look hyperpigmented on her skin. There are some vesicles with clear fluid, but are not present in a dermatomal pattern, on her neck, between skin folds, hand folds, upper arms, and lower legs, scalp.  There is scattered crusting present as well. Her son has a much worsened presentation, with presence of this rash in his genitals as well  No evidence of arthropod or mosquito. No new neckeless or soaps. No nw medications. Differential includes scabies, urticaria, atopic dermatitis, allergic contact dermatitis, folliculitis, tinea corporis.   Given the timing of spread from son to mother and initiation of symptoms 2 weeks after her son's presentation, this presentation is concerning for a contagious etiology like scabies. As such, will treat wit topical Permethrin cream (instructions provided) and antihistamines to help with pruritus.  Direct instructions to handle household items were reviewed.

## 2023-04-28 NOTE — Patient Instructions (Addendum)
Thank you, [name] for allowing [place] to provide your care today. Today we discussed   YOUR RASH - we think you have scabies   I have prescribed Permethrin - sent to [facility] - it is a cream that  -  should massage permethrin cream thoroughly into the skin from the neck to the soles of the feet, including areas under the fingernails and toenails - permethrin should also be applied to the scalp and face (sparing the eyes and mouth) - Treatment is often performed overnight. - Permethrin should be removed by washing (shower or bath) after 8 to 14 hours.  Wash all clothing and bedding in hot water ? 50C (? 122F).   Treat all of the following individuals simultaneously with the patient:  Household members within the last month Sexual partners within the last two months  Advise household members to avoid direct, prolonged contact with the patient's skin, bedding, and clothing until treatment completion.  It if is not better, two weeks later, you would need to come back for a re-check. All people in the household or sharing intimate clothing or spaces, should be treated and seen by their doctor.  I have ordered the following labs for you:   Lab Orders         Microalbumin / Creatinine Urine Ratio         Glucose, capillary         POC Hbg A1C      I will call if any are abnormal. All of your labs can be accessed through "My Chart".   My Chart Access: [email or web address]  Please follow-up in:    We look forward to seeing you next time. Please call our clinic at [phone] if you have any questions or concerns. The best time to call is Monday-Friday from 9am-4pm, but there is someone available 24/7. If after hours or the weekend, call the main hospital number and ask for the Internal Medicine Resident On-Call. If you need medication refills, please notify your pharmacy one week in advance and they will send [place] a request.   Thank you  for letting us take part in your care. Wishing you the best!  Morene Crocker, MD 04/28/2023, 1:46 PM [facility] Resident, PGY-1

## 2023-04-28 NOTE — Progress Notes (Signed)
Subjective:  CC: Itchy rash  HPI:  Tanya Austin is a 52 y.o. female with a past medical history stated below and presents today for pruritic dermatitis in multiple areas of her body. Please see problem based assessment and plan for additional details.  Past Medical History:  Diagnosis Date   Diabetes mellitus    Fibroid    Headache    Herpes    High cholesterol    History of COVID-19 09/10/2020   Hx of gonorrhea 2017    Current Outpatient Medications on File Prior to Visit  Medication Sig Dispense Refill   acetaminophen (TYLENOL) 500 MG tablet Take 1,000 mg by mouth 2 (two) times daily as needed for moderate pain.     ALPRAZolam (XANAX) 1 MG tablet Take 1 mg by mouth at bedtime as needed.     Cholecalciferol (VITAMIN D3) 3000 units TABS Take 1 capsule by mouth daily.      conjugated estrogens (PREMARIN) vaginal cream Place 1 Applicatorful vaginally daily. (Patient not taking: Reported on 09/29/2022) 42.5 g 12   cyclobenzaprine (FLEXERIL) 5 MG tablet Take 1 tablet (5 mg total) by mouth 3 (three) times daily as needed for muscle spasms. (Patient not taking: Reported on 09/29/2022) 30 tablet 0   fish oil-omega-3 fatty acids 1000 MG capsule Take 1 g by mouth daily.     fluticasone (FLONASE) 50 MCG/ACT nasal spray SHAKE LIQUID AND USE 1 SPRAY IN EACH NOSTRIL IN THE MORNING AND AT BEDTIME 48 g 0   ibuprofen (ADVIL) 800 MG tablet Take 1 tablet (800 mg total) by mouth every 8 (eight) hours as needed. 30 tablet 1   IRON PO Take 1 tablet by mouth daily. otc medication- not sure of dose     Probiotic Product (PROBIOTIC DAILY PO) Take 1 capsule by mouth daily. (Patient not taking: Reported on 11/14/2021)     RESTASIS 0.05 % ophthalmic emulsion 1 drop 2 (two) times daily.     simvastatin (ZOCOR) 20 MG tablet TAKE 1 TABLET BY MOUTH AT BEDTIME 180 tablet 1   triamcinolone ointment (KENALOG) 0.5 % Apply 1 application topically 2 (two) times daily. Bid x 4 wks, then qd x 4 wks, then qod  x 4 wks, then 2x/wk 30 g 0   valACYclovir (VALTREX) 500 MG tablet Take 1 tablet daily with no outbreak. Take 1 tablet BID x 3 days with an outbreak 90 tablet 3   [DISCONTINUED] Insulin Glargine (TOUJEO SOLOSTAR) 300 UNIT/ML SOPN Inject 14 Units into the skin at bedtime. (Patient not taking: Reported on 09/10/2020)     [DISCONTINUED] promethazine (PHENERGAN) 25 MG tablet Take 1 tablet (25 mg total) by mouth every 6 (six) hours as needed for nausea or vomiting. (Patient not taking: Reported on 09/10/2020) 30 tablet 0   No current facility-administered medications on file prior to visit.    Family History  Problem Relation Age of Onset   Hypertension Other    Hyperlipidemia Other    Heart disease Other    Hypertension Mother    Diabetes Mother    Hypertension Father     Social History   Socioeconomic History   Marital status: Single    Spouse name: Not on file   Number of children: Not on file   Years of education: Not on file   Highest education level: Not on file  Occupational History   Not on file  Tobacco Use   Smoking status: Former    Types: Cigarettes  Quit date: 06/09/2000    Years since quitting: 22.8   Smokeless tobacco: Never  Vaping Use   Vaping Use: Never used  Substance and Sexual Activity   Alcohol use: No    Comment: Socially on weekends   Drug use: No    Types: Marijuana   Sexual activity: Yes    Birth control/protection: Pill    Comment: Trinessa  Other Topics Concern   Not on file  Social History Narrative   Not on file   Social Determinants of Health   Financial Resource Strain: Not on file  Food Insecurity: Not on file  Transportation Needs: Not on file  Physical Activity: Not on file  Stress: Not on file  Social Connections: Not on file  Intimate Partner Violence: Not on file    Review of Systems: ROS negative except for what is noted on the assessment and plan.  Objective:   Vitals:   04/28/23 1319 04/28/23 1353  BP: (!) 142/91  130/79  Pulse: 77 62  Temp: 98.3 F (36.8 C)   TempSrc: Oral   SpO2: 100%   Weight: 196 lb 1.6 oz (89 kg)   Height: 5' 7.25" (1.708 m)     Physical Exam: Constitutional: well-appearing woman sitting in chair, in no acute distress HENT: normocephalic atraumatic, mucous membranes moist Eyes: conjunctiva non-erythematous Neck: supple Cardiovascular: regular rate and rhythm, no m/r/g Pulmonary/Chest: normal work of breathing on room air, lungs clear to auscultation bilaterally Abdominal: soft, non-tender, non-distended MSK: normal bulk and tone Neurological: alert & oriented x 3 Skin: warm and dry. small papules that look hyperpigmented on her skin. There are some vesicles with clear fluid, but are not present in a dermatomal pattern, on her neck, between skin folds, hand folds, upper arms, and lower legs, scalp.  There is scattered crusting present as well. Psych: Pleasant mood and affect       04/28/2023    1:21 PM  Depression screen PHQ 2/9  Decreased Interest 0  Down, Depressed, Hopeless 0  PHQ - 2 Score 0       10/11/2020   10:01 AM 09/25/2020    1:42 PM 12/29/2019    9:30 AM 10/06/2019   11:59 AM  GAD 7 : Generalized Anxiety Score  Nervous, Anxious, on Edge 1 0 1 1  Control/stop worrying 1 0 1 1  Worry too much - different things 1 0 1 1  Trouble relaxing 2 0 1 1  Restless 1 0 0 1  Easily annoyed or irritable 1 0 1 1  Afraid - awful might happen 0 0 0 1  Total GAD 7 Score 7 0 5 7  Anxiety Difficulty Not difficult at all        Assessment & Plan:   Diabetes [facility]) Patient presenting for diabetes follow up. She has been adherent Janumet. Denies polydipsia, polyuria. Diet: Exercise Last A1c:  Lab Results  Component Value Date   HGBA1C 6.2 (A) 06/24/2022   Foot exam today without ulcerations and intact proprioception. She is up-to-date with eye exam.   Pruritic dermatitis Patient presents to clinic with prutitic rash that started after her son's. It is  characterized by intense prurituc with small papules that look hyperpigmented on her skin. There are some vesicles with clear fluid, but are not present in a dermatomal pattern, on her neck, between skin folds, hand folds, upper arms, and lower legs, scalp.  There is scattered crusting present as well. Her son has a much worsened presentation, with  presence of this rash in his genitals as well  No evidence of arthropod or mosquito. No new neckeless or soaps. No nw medications. Differential includes scabies, urticaria, atopic dermatitis, allergic contact dermatitis, folliculitis, tinea corporis.   Given the timing of spread from son to mother and initiation of symptoms 2 weeks after her son's presentation, this presentation is concerning for a contagious etiology like scabies. As such, will treat wit topical Permethrin cream (instructions provided) and antihistamines to help with pruritus.  Direct instructions to handle household items were reviewed.     Return if symptoms worsen or fail to improve.  Patient seen with Dr. Olene Floss, MD [facility] Internal Medicine Program - PGY-1 04/28/2023, 5:02 PM

## 2023-04-29 ENCOUNTER — Encounter: Payer: Self-pay | Admitting: Student

## 2023-04-29 ENCOUNTER — Encounter: Payer: Medicaid Other | Admitting: Student

## 2023-04-29 LAB — MICROALBUMIN / CREATININE URINE RATIO
Creatinine, Urine: 131.7 mg/dL
Microalb/Creat Ratio: 3 mg/g creat (ref 0–29)
Microalbumin, Urine: 3.7 ug/mL

## 2023-04-29 NOTE — Progress Notes (Signed)
No microalbuminuria. Patient was not able to be reached by phone x3 today. Will mail letter.

## 2023-05-03 ENCOUNTER — Encounter (HOSPITAL_COMMUNITY): Payer: Self-pay

## 2023-05-03 ENCOUNTER — Emergency Department (HOSPITAL_COMMUNITY)
Admission: EM | Admit: 2023-05-03 | Discharge: 2023-05-04 | Discharge status: Against Medical Advice | Payer: Medicaid Other | Attending: Emergency Medicine | Admitting: Emergency Medicine

## 2023-05-03 ENCOUNTER — Other Ambulatory Visit: Payer: Self-pay

## 2023-05-03 DIAGNOSIS — Z5321 Procedure and treatment not carried out due to patient leaving prior to being seen by health care provider: Secondary | ICD-10-CM | POA: Diagnosis not present

## 2023-05-03 DIAGNOSIS — N898 Other specified noninflammatory disorders of vagina: Secondary | ICD-10-CM | POA: Diagnosis present

## 2023-05-03 NOTE — Progress Notes (Signed)
Internal Medicine Clinic Attending  I saw and evaluated the patient.  I personally confirmed the key portions of the history and exam documented by Dr. Gomez-Caraballo and I reviewed pertinent patient test results.  The assessment, diagnosis, and plan were formulated together and I agree with the documentation in the resident's note.  

## 2023-05-03 NOTE — ED Triage Notes (Signed)
Pt was treated for scabies but did not apply to vagina and buttocks. Pt ran out of RX and now the scabies are in her vagina and buttocks. Pt is having vaginal itching and pain

## 2023-05-04 ENCOUNTER — Other Ambulatory Visit: Payer: Self-pay

## 2023-05-04 ENCOUNTER — Telehealth: Payer: Self-pay

## 2023-05-04 ENCOUNTER — Ambulatory Visit (INDEPENDENT_AMBULATORY_CARE_PROVIDER_SITE_OTHER): Payer: Medicaid Other

## 2023-05-04 ENCOUNTER — Ambulatory Visit (HOSPITAL_COMMUNITY)
Admission: EM | Admit: 2023-05-04 | Discharge: 2023-05-04 | Disposition: A | Discharge status: Home / Self Care | Payer: Medicaid Other

## 2023-05-04 ENCOUNTER — Encounter (HOSPITAL_COMMUNITY): Payer: Self-pay | Admitting: Emergency Medicine

## 2023-05-04 VITALS — BP 134/68 | HR 58 | Temp 98.1°F | Wt 192.0 lb

## 2023-05-04 DIAGNOSIS — R300 Dysuria: Secondary | ICD-10-CM | POA: Insufficient documentation

## 2023-05-04 DIAGNOSIS — B86 Scabies: Secondary | ICD-10-CM | POA: Diagnosis not present

## 2023-05-04 DIAGNOSIS — L308 Other specified dermatitis: Secondary | ICD-10-CM

## 2023-05-04 DIAGNOSIS — Z7984 Long term (current) use of oral hypoglycemic drugs: Secondary | ICD-10-CM

## 2023-05-04 DIAGNOSIS — E119 Type 2 diabetes mellitus without complications: Secondary | ICD-10-CM

## 2023-05-04 MED ORDER — PERMETHRIN 5 % EX CREA: 1.0000 | TOPICAL_CREAM | Freq: Once | CUTANEOUS | 0 refills | Status: AC

## 2023-05-04 NOTE — Assessment & Plan Note (Signed)
Current medications include Janumet 50-1000 MG daily. Patient states that they are compliant with this medication. Patient denies polyuria, polydipsia, fatigue. Patient states that they do visit the ophthalmologist for yearly eye exams. A1c was 6.3% 6 days ago.   Plan: - Continue Janumet 50-1000 MG daily - Urine ACR today

## 2023-05-04 NOTE — Discharge Instructions (Addendum)
Your primary care physician called in permethrin earlier today.  This is the preferred treatment option. Please reapply to your entire body, sparing the face. It is safe to apply to the groin and buttocks, however do not insert internally.  This is for external use only. Also use a toothbrush and apply permethrin underneath the fingernails to prevent reinfection. If in 5 to 7 days you are still having symptoms, we would consider an oral medication at that time.  Should you develop any new symptoms such as vaginal discharge or pelvic pain, please return to clinic for recheck.

## 2023-05-04 NOTE — Patient Instructions (Addendum)
Thank you for coming to see [place] in clinic Ms. Cham.  Plan:  - Please continue using:   - Permethrin cream (please apply to all skin but not within the vagina, anus, or mouth); please treat everyone in the house as well  Instructions: Apply to all areas of the body from the neck to soles of feet; leave on for 8 to 14 hours before removing by washing (shower or bath)  - We got the following labs today and will call you with the results:     - Urine albumin/creatinine ratio to check your kidney function related to your diabetes   - Urinalysis to check for signs of UTI  It was very nice to see you, thank you for allowing [place] to be involved in your care. We look forward to seeing you next time. Please call our clinic at [phone] if you have any questions or concerns. The best time to call is Monday-Friday from 9am-4pm, but there is someone available 24/7. If after hours or the weekend, call the main hospital number at [phone] and ask for the Internal Medicine Resident On-Call. If you need medication refills, please notify your pharmacy one week in advance and they will send [place] a request.   Please make sure to arrive 15 minutes prior to your next appointment. If you arrive late, you may be asked to reschedule.

## 2023-05-04 NOTE — ED Provider Notes (Signed)
[facility]    CSN: [id number] Arrival date & time: 05/04/23  1857      History   Chief Complaint Chief Complaint  Patient presents with   Vaginal Itching    HPI Tanya Austin is a 52 y.o. female.   52 year old female presents today due to concerns of recurrent scabies.  She was seen on 529 by her primary care physician and was given permethrin cream.  She states she applied this to her body on that same day, and the original rash resolved completely.  She states she feels like it started to come back however and she is having vaginal itching and itching between her buttocks.  She states she did not apply the topical cream to that area initially as she was concerned to get it back close to her privates.  She saw her PCP for follow-up earlier today, and was given a refill of the permethrin cream, but is here today feeling that something else needs to be done. Denies pelvic pain/ vag discharge.    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 Authorizing Provider  acetaminophen (TYLENOL) 500 MG tablet Take 1,000 mg by mouth 2 (two) times daily as needed for moderate pain.    [provider]  ALPRAZolam Prudy Feeler) 1 MG tablet Take 1 mg by mouth at bedtime as needed.    [provider]  Cholecalciferol (VITAMIN D3) 3000 units TABS Take 1 capsule by mouth daily.     [provider]  conjugated estrogens (PREMARIN) vaginal cream Place 1 Applicatorful vaginally daily. Patient not taking: Reported on 09/29/2022 02/02/22   Adron Bene, MD  cyclobenzaprine (FLEXERIL) 5 MG tablet Take 1 tablet (5 mg total) by mouth 3 (three) times daily as needed for muscle spasms. Patient not taking: Reported on 09/29/2022 03/30/22   Ilene Qua, MD  fish oil-omega-3 fatty acids 1000 MG capsule Take 1 g by mouth daily.    [provider]  fluticasone (FLONASE) 50 MCG/ACT nasal spray SHAKE LIQUID AND USE 1 SPRAY IN EACH NOSTRIL  IN THE MORNING AND AT BEDTIME 11/20/21   Doran Stabler, DO  hydrOXYzine (ATARAX) 10 MG tablet Take 1 tablet (10 mg total) by mouth 3 (three) times daily as needed for itching. 04/28/23 05/28/23  Morene Crocker, MD  ibuprofen (ADVIL) 800 MG tablet Take 1 tablet (800 mg total) by mouth every 8 (eight) hours as needed. 03/30/22   Demaio, Alexa, MD  IRON PO Take 1 tablet by mouth daily. otc medication- not sure of dose    [provider]  permethrin (ELIMITE) 5 % cream Apply 1 Application topically once for 1 dose. 05/04/23 05/04/23  Mapp, Gaylyn Cheers, MD  Probiotic Product (PROBIOTIC DAILY PO) Take 1 capsule by mouth daily. Patient not taking: Reported on 11/14/2021    [provider]  RESTASIS 0.05 % ophthalmic emulsion 1 drop 2 (two) times daily. 09/05/20   [provider]  simvastatin (ZOCOR) 20 MG tablet TAKE 1 TABLET BY  MOUTH AT BEDTIME 12/07/22   Adron Bene, MD  sitaGLIPtin-metformin (JANUMET) 50-1000 MG tablet Take 1 tablet by mouth daily. 04/28/23 07/27/23  Morene Crocker, MD  triamcinolone ointment (KENALOG) 0.5 % Apply 1 application topically 2 (two) times daily. Bid x 4 wks, then qd x 4 wks, then qod x 4 wks, then 2x/wk 11/14/21   Reva Bores, MD  valACYclovir (VALTREX) 500 MG tablet Take 1 tablet daily with no outbreak. Take 1 tablet BID x 3 days with an outbreak 10/16/22   Reva Bores, MD  Insulin Glargine (TOUJEO SOLOSTAR) 300 UNIT/ML SOPN Inject 14 Units into the skin at bedtime. Patient not taking: Reported on 09/10/2020  09/25/20  [provider]  promethazine (PHENERGAN) 25 MG tablet Take 1 tablet (25 mg total) by mouth every 6 (six) hours as needed for nausea or vomiting. Patient not taking: Reported on 09/10/2020 10/24/17 09/25/20  Alvira Monday, MD    Family History Family History  Problem Relation Age of Onset   Hypertension Mother    Diabetes Mother    Hypertension Father    Hypertension Other    Hyperlipidemia Other    Heart disease Other     Social History Social History   Tobacco Use   Smoking status: Former    Types: Cigarettes    Quit date: 06/09/2000    Years since quitting: 22.9   Smokeless tobacco: Never  Vaping Use   Vaping Use: Never used  Substance Use Topics   Alcohol use: No    Comment: Socially on weekends   Drug use: No    Types: Marijuana     Allergies   Patient has no known allergies.   Review of Systems Review of Systems As per HPI  Physical Exam Triage Vital Signs ED Triage Vitals  Enc Vitals Group     BP 05/04/23 1953 133/75     Pulse Rate 05/04/23 1953 73     Resp --      Temp 05/04/23 1953 98.1 F (36.7 C)     Temp Source 05/04/23 1953 Oral     SpO2 05/04/23 1953 98 %     Weight --      Height --      Head Circumference --      Peak Flow --      Pain Score 05/04/23 1950 10     Pain Loc --      Pain  Edu? --      Excl. in GC? --    No data found.  Updated Vital Signs BP  133/75 (BP Location: Left Arm)   Pulse 73   Temp 98.1 F (36.7 C) (Oral)   LMP 05/04/2023   SpO2 98%   Visual Acuity Right Eye Distance:   Left Eye Distance:   Bilateral Distance:    Right Eye Near:   Left Eye Near:    Bilateral Near:     Physical Exam Vitals and nursing note reviewed.  Constitutional:      General: She is not in acute distress.    Appearance: Normal appearance. She is obese. She is not ill-appearing, toxic-appearing or diaphoretic.  HENT:     Head: Normocephalic and atraumatic.  Cardiovascular:     Rate and Rhythm: Normal rate.  Pulmonary:     Effort: Pulmonary effort is normal. No respiratory distress.  Skin:    General: Skin is warm and dry.     Findings: Rash (papular rash to buttocks with excoriation. Limited eval of the vaginal region due to menses) present.     Comments: No appreciable rash to back or arms, however several excoriations noted  Neurological:     General: No focal deficit present.     Mental Status: She is alert and oriented to person, place, and time.      UC Treatments / Results  Labs (all labs ordered are listed, but only abnormal results are displayed) Labs Reviewed - No data to display  EKG   Radiology No results found.  Procedures Procedures (including critical care time)  Medications Ordered in UC Medications - No data to display  Initial Impression / Assessment and Plan / UC Course  I have reviewed the triage vital signs and the nursing notes.  Pertinent labs & imaging results that were available during my care of the patient were reviewed by me and considered in my medical decision making (see chart for details).     Scabies -patient recently treated for scabies, states the initial rash resolved.  She did not a place it to the area of concern today however.  She saw someone earlier today and got a refill of the permethrin, but  presented to urgent care requesting an oral pill instead.  She does not hear of crusted scabies and had an excellent response to permethrin, therefore I feel that the cream called in earlier is the preferred option.  Recommended she apply to all areas of the body other than face, do not insert into the vagina.  Also use a toothbrush and apply the permethrin underneath the nails to prevent reinfection. Should sx persist, reach out to PCP for possible PO medication.   Final Clinical Impressions(s) / UC Diagnoses   Final diagnoses:  Scabies infestation     Discharge Instructions      Your primary care physician called in permethrin earlier today.  This is the preferred treatment option. Please reapply to your entire body, sparing the face. It is safe to apply to the groin and buttocks, however do not insert internally.  This is for external use only. Also use a toothbrush and apply permethrin underneath the fingernails to prevent reinfection. If in 5 to 7 days you are still having symptoms, we would consider an oral medication at that time.  Should you develop any new symptoms such as vaginal discharge or pelvic pain, please return to clinic for recheck.     ED Prescriptions   None    PDMP not reviewed this encounter.   Maretta Bees, [place] 05/04/23 2150

## 2023-05-04 NOTE — Assessment & Plan Note (Signed)
Patient was last seen in clinic for 2 weeks of pruritic rash on 5/29. Patient was noted to have small pruritic hyperpigmented papules on her neck, between skin folds, hand folds, upper arms, lower legs, and scalp. Was noted to have crusting and some with vesicles containing clear fluid. Denied using new products such as soaps or jewelry.  Patient also reported that her son had similar symptoms. Patient was prescribed topical permethrin cream and antihistamines. She was also provided with instructions on how to handle household items. Patient reports that her rash has improved somewhat since applying the permethrin cream but has spread to her genital region. She reports that her sons symptoms have resolved after their provider gave them permethrin cream. Reports dysuria over the last few days. Patient is not currently sexually active. Denies vaginal discharge or odor. She is currently menstruating. Exam findings are consistent w/ scabies. Will retreat with permethrin. Discussed retreating all household members and provided instructions on handling household items.   Plan: - Refill permethrin

## 2023-05-04 NOTE — ED Notes (Signed)
Called pt x2 to obtain v/s, no answer

## 2023-05-04 NOTE — Telephone Encounter (Signed)
Patient called she stated she is having issues with scabies, patient stated the scabies is in her vaginal area/buttocks. I scheduled patient for an appointment this morning she will try to make it if not she is requesting a call back.Please return patients call.

## 2023-05-04 NOTE — Progress Notes (Signed)
CC: Rash  HPI:  [name] is a 52 y.o. female with past medical history of HLD, T2DM, low back pain, anxiety that presents for rash.    Allergies as of 05/04/2023   No Known Allergies      Medication List        Accurate as of May 04, 2023 10:33 AM. If you have any questions, ask your nurse or doctor.          acetaminophen 500 MG tablet Commonly known as: TYLENOL Take 1,000 mg by mouth 2 (two) times daily as needed for moderate pain.   ALPRAZolam 1 MG tablet Commonly known as: XANAX Take 1 mg by mouth at bedtime as needed.   conjugated estrogens vaginal cream Commonly known as: PREMARIN Place 1 Applicatorful vaginally daily.   cyclobenzaprine 5 MG tablet Commonly known as: FLEXERIL Take 1 tablet (5 mg total) by mouth 3 (three) times daily as needed for muscle spasms.   fish oil-omega-3 fatty acids 1000 MG capsule Take 1 g by mouth daily.   fluticasone 50 MCG/ACT nasal spray Commonly known as: FLONASE SHAKE LIQUID AND USE 1 SPRAY IN EACH NOSTRIL IN THE MORNING AND AT BEDTIME   hydrOXYzine 10 MG tablet Commonly known as: ATARAX Take 1 tablet (10 mg total) by mouth 3 (three) times daily as needed for itching.   ibuprofen 800 MG tablet Commonly known as: ADVIL Take 1 tablet (800 mg total) by mouth every 8 (eight) hours as needed.   IRON PO Take 1 tablet by mouth daily. otc medication- not sure of dose   Janumet 50-1000 MG tablet Generic drug: sitaGLIPtin-metformin Take 1 tablet by mouth daily.   permethrin 5 % cream Commonly known as: ELIMITE Apply 1 Application topically once for 1 dose. Started by: Karoline Caldwell, MD   PROBIOTIC DAILY PO Take 1 capsule by mouth daily.   Restasis 0.05 % ophthalmic emulsion Generic drug: cycloSPORINE 1 drop 2 (two) times daily.   simvastatin 20 MG tablet Commonly known as: ZOCOR TAKE 1 TABLET BY MOUTH AT BEDTIME   triamcinolone ointment 0.5 % Commonly known as: KENALOG Apply 1 application topically  2 (two) times daily. Bid x 4 wks, then qd x 4 wks, then qod x 4 wks, then 2x/wk   valACYclovir 500 MG tablet Commonly known as: VALTREX Take 1 tablet daily with no outbreak. Take 1 tablet BID x 3 days with an outbreak   Vitamin D3 75 MCG (3000 UT) Tabs Take 1 capsule by mouth daily.         Past Medical History:  Diagnosis Date   Diabetes mellitus    Fibroid    Headache    Herpes    High cholesterol    History of COVID-19 09/10/2020   Hx of gonorrhea 2017   Review of Systems:  per HPI.   Physical Exam: Vitals:   05/04/23 0910  BP: 134/68  Pulse: (!) 58  Temp: 98.1 F (36.7 C)  TempSrc: Oral  SpO2: 99%  Weight: 192 lb (87.1 kg)   Constitutional: Well-developed, well-nourished, appears anxious  HENT: Normocephalic and atraumatic.  Eyes: EOM are normal. PERRL.  Neck: Normal range of motion.  Cardiovascular: Regular rate, regular rhythm. No murmurs, rubs, or gallops. Normal radial and PT pulses bilaterally. No LE edema.  Pulmonary: Normal respiratory effort. No wheezes, rales, or rhonchi.   Abdominal: Soft. Non-distended. No tenderness. Normal bowel sounds.  Musculoskeletal: Normal range of motion.     Neurological: Alert and oriented to  person, place, and time. Non-focal. Skin: warm and dry. Small hyperpigmented papules with minimal crusting within the folds of skin surrounding the vagina and anus.   Assessment & Plan:   See Encounters Tab for problem based charting.  Pruritic dermatitis Patient was last seen in clinic for 2 weeks of pruritic rash on 5/29. Patient was noted to have small pruritic hyperpigmented papules on her neck, between skin folds, hand folds, upper arms, lower legs, and scalp. Was noted to have crusting and some with vesicles containing clear fluid. Denied using new products such as soaps or jewelry.  Patient also reported that her son had similar symptoms. Patient was prescribed topical permethrin cream and antihistamines. She was also provided  with instructions on how to handle household items. Patient reports that her rash has improved somewhat since applying the permethrin cream but has spread to her genital region. She reports that her sons symptoms have resolved after their provider gave them permethrin cream. Reports dysuria over the last few days. Patient is not currently sexually active. Denies vaginal discharge or odor. She is currently menstruating. Exam findings are consistent w/ scabies. Will retreat with permethrin. Discussed retreating all household members and provided instructions on handling household items.   Plan: - Refill permethrin  Dysuria Reports dysuria over the last few days. Patient is not currently sexually active. Denies vaginal discharge or odor. She is currently menstruating. Given her dysuria, will order UA to assess for UTI.    Plan: - UA today  Diabetes (HCC) Current medications include Janumet 50-1000 MG daily. Patient states that they are compliant with this medication. Patient denies polyuria, polydipsia, fatigue. Patient states that they do visit the ophthalmologist for yearly eye exams. A1c was 6.3% 6 days ago.   Plan: - Continue Janumet 50-1000 MG daily - Urine ACR today    Patient seen with Dr. Oswaldo Done

## 2023-05-04 NOTE — Assessment & Plan Note (Signed)
Reports dysuria over the last few days. Patient is not currently sexually active. Denies vaginal discharge or odor. She is currently menstruating. Given her dysuria, will order UA to assess for UTI.    Plan: - UA today

## 2023-05-04 NOTE — ED Triage Notes (Addendum)
Patient complains of scabies in vaginal area.  See notes from 05/03/2023  Patient does not agree with prescription in ED.

## 2023-05-05 ENCOUNTER — Encounter: Payer: Self-pay | Admitting: *Deleted

## 2023-05-05 LAB — URINALYSIS, ROUTINE W REFLEX MICROSCOPIC
Bilirubin, UA: NEGATIVE
Glucose, UA: NEGATIVE
Ketones, UA: NEGATIVE
Leukocytes,UA: NEGATIVE
Nitrite, UA: NEGATIVE
Protein,UA: NEGATIVE
Specific Gravity, UA: 1.017 (ref 1.005–1.030)
Urobilinogen, Ur: 1 mg/dL (ref 0.2–1.0)
pH, UA: 7.5 (ref 5.0–7.5)

## 2023-05-05 LAB — MICROSCOPIC EXAMINATION
Bacteria, UA: NONE SEEN
Casts: NONE SEEN /lpf
Epithelial Cells (non renal): NONE SEEN /hpf (ref 0–10)
RBC, Urine: >30 /hpf — AB (ref 0–2)
WBC, UA: NONE SEEN /hpf (ref 0–5)

## 2023-05-05 LAB — MICROALBUMIN / CREATININE URINE RATIO
Creatinine, Urine: 108.1 mg/dL
Microalb/Creat Ratio: 8 mg/g creat (ref 0–29)
Microalbumin, Urine: 8.5 ug/mL

## 2023-05-05 NOTE — Progress Notes (Signed)
Internal Medicine Clinic Attending  Case discussed with Dr. Mapp  At the time of the visit.  We reviewed the resident's history and exam and pertinent patient test results.  I agree with the assessment, diagnosis, and plan of care documented in the resident's note.  

## 2023-05-07 ENCOUNTER — Ambulatory Visit (INDEPENDENT_AMBULATORY_CARE_PROVIDER_SITE_OTHER): Payer: Medicaid Other

## 2023-05-07 VITALS — BP 127/72 | HR 84 | Temp 98.2°F

## 2023-05-07 DIAGNOSIS — L308 Other specified dermatitis: Secondary | ICD-10-CM

## 2023-05-07 MED ORDER — PERMETHRIN 5 % EX CREA: TOPICAL_CREAM | Freq: Once | CUTANEOUS | 0 refills | Status: DC

## 2023-05-07 MED ORDER — HYDROCORTISONE 1 % EX CREA
TOPICAL_CREAM | CUTANEOUS | 0 refills | Status: DC
Start: 2023-05-07 — End: 2023-07-28

## 2023-05-07 NOTE — Assessment & Plan Note (Addendum)
Patient presents today for persistent itching and burning at her fingers and anus.  She has presented twice now for what was presumed to be scabies.  She has been treated with topical permethrin once on May 29 and once on June 4.  She only did 1 application each time and thus I feel she may have been incompletely treated.  On exam I am unable to identify any signs of burrowing at her hands or anus where her symptoms are presently.  Despite this, I believe she is appropriate for an additional dose today which would essentially complete 2 applications in 3 days.  We also discussed proper methods to eradicate scabies including bundling linens and black trash bag and setting out in the sun for a day or 2.  If her symptoms persist and there is new evidence of burrowing, patient would likely need oral ivermectin. -Additional application of permethrin tonight -Continue Zyrtec, will also provide hydrocortisone for symptom relief -Advised patient to call clinic in the next 2 weeks if her symptoms fail to improve or worsen

## 2023-05-07 NOTE — Progress Notes (Signed)
   CC: Burning sensation at fingers and anus.  HPI:  [name] is a 52 y.o. female with past medical history as below who presents with burning of her fingers and anus.  Please see detailed assessment plan for HPI.  Past Medical History:  Diagnosis Date   Diabetes mellitus    Fibroid    Headache    Herpes    High cholesterol    History of COVID-19 09/10/2020   Hx of gonorrhea 2017   Review of Systems: See detailed assessment and plan for pertinent ROS.  Physical Exam:  Vitals:   05/07/23 1041  BP: 127/72  Pulse: 84  Temp: 98.2 F (36.8 C)  TempSrc: Oral  SpO2: 100%   Physical Exam Exam conducted with a chaperone present.  Constitutional:      General: She is not in acute distress. HENT:     Head: Normocephalic and atraumatic.  Eyes:     Extraocular Movements: Extraocular movements intact.  Pulmonary:     Effort: Pulmonary effort is normal.  Genitourinary:    General: Normal vulva.     Pubic Area: No rash.      Labia:        Right: No rash.        Left: No rash.      Comments: Anus without any rash or burrows. Skin:    General: Skin is warm and dry.     Findings: No rash.     Comments: No rash or burrows on fingers.  Neurological:     Mental Status: She is alert and oriented to person, place, and time.  Psychiatric:        Mood and Affect: Mood normal.        Behavior: Behavior normal.      Assessment & Plan:   See Encounters Tab for problem based charting.  Pruritic dermatitis Patient presents today for persistent itching and burning at her fingers and anus.  She has presented twice now for what was presumed to be scabies.  She has been treated with topical permethrin once on May 29 and once on June 4.  She only did 1 application each time and thus I feel she may have been incompletely treated.  On exam I am unable to identify any signs of burrowing at her hands or anus where her symptoms are presently.  Despite this, I believe she is  appropriate for an additional dose today which would essentially complete 2 applications in 3 days.  We also discussed proper methods to eradicate scabies including bundling linens and black trash bag and setting out in the sun for a day or 2.  If her symptoms persist and there is new evidence of burrowing, patient would likely need oral ivermectin. -Additional application of permethrin tonight -Continue Zyrtec, will also provide hydrocortisone for symptom relief -Advised patient to call clinic in the next 2 weeks if her symptoms fail to improve or worsen  Patient discussed with Dr. Oswaldo Done

## 2023-05-07 NOTE — Patient Instructions (Signed)
[name], it was a pleasure seeing you today!  Today we discussed: Try permethrin again tonight. Try to eliminate the bugs like we discussed. Call us if your symptoms are not improved in the next couple of weeks while doing Zyrtec and cream.  I have ordered the following labs today:  Lab Orders  No laboratory test(s) ordered today     Tests ordered today:  none  Referrals ordered today:   Referral Orders  No referral(s) requested today     I have ordered the following medication/changed the following medications:   Stop the following medications: There are no discontinued medications.   Start the following medications: Meds ordered this encounter  Medications   permethrin (ELIMITE) 5 % cream    Sig: Apply topically once for 1 dose.    Dispense:  60 g    Refill:  0   hydrocortisone cream 1 %    Sig: Apply to affected area 2 times daily    Dispense:  14 g    Refill:  0     Follow-up:  prn    Please make sure to arrive 15 minutes prior to your next appointment. If you arrive late, you may be asked to reschedule.   We look forward to seeing you next time. Please call our clinic at [phone] if you have any questions or concerns. The best time to call is Monday-Friday from 9am-4pm, but there is someone available 24/7. If after hours or the weekend, call the main hospital number and ask for the Internal Medicine Resident On-Call. If you need medication refills, please notify your pharmacy one week in advance and they will send [place] a request.  Thank you for letting us take part in your care. Wishing you the best!  Thank you, Adron Bene, MD

## 2023-05-09 ENCOUNTER — Other Ambulatory Visit: Payer: Self-pay

## 2023-05-10 NOTE — Telephone Encounter (Signed)
I called pt to get more info on request for Permethrin but no answer nor vm, unable to leave a message. ED 6/4 for scabies and f/u appt 6/7 with Dr Cliffton Asters.

## 2023-05-10 NOTE — Progress Notes (Signed)
Internal Medicine Clinic Attending  Case discussed with Dr. White  At the time of the visit.  We reviewed the resident's history and exam and pertinent patient test results.  I agree with the assessment, diagnosis, and plan of care documented in the resident's note.  

## 2023-05-11 ENCOUNTER — Telehealth: Payer: Self-pay

## 2023-05-11 ENCOUNTER — Other Ambulatory Visit: Payer: Self-pay

## 2023-05-11 ENCOUNTER — Ambulatory Visit (INDEPENDENT_AMBULATORY_CARE_PROVIDER_SITE_OTHER): Payer: Medicaid Other

## 2023-05-11 VITALS — BP 123/52 | HR 57 | Temp 98.0°F | Ht 67.0 in | Wt 189.8 lb

## 2023-05-11 DIAGNOSIS — N9489 Other specified conditions associated with female genital organs and menstrual cycle: Secondary | ICD-10-CM

## 2023-05-11 DIAGNOSIS — L308 Other specified dermatitis: Secondary | ICD-10-CM

## 2023-05-11 DIAGNOSIS — R319 Hematuria, unspecified: Secondary | ICD-10-CM

## 2023-05-11 MED ORDER — DIPHENHYDRAMINE HCL 2 % EX CREA: TOPICAL_CREAM | Freq: Three times a day (TID) | CUTANEOUS | 0 refills | Status: DC | PRN

## 2023-05-11 NOTE — Patient Instructions (Signed)
Ms.Courtenay R Harewood, it was a pleasure seeing you today!  Today we discussed: Try topical benadryl and hydroxyzine.   I have ordered the following labs today:  Lab Orders         Urinalysis, Reflex Microscopic      Tests ordered today:  none  Referrals ordered today:   Referral Orders  No referral(s) requested today     I have ordered the following medication/changed the following medications:   Stop the following medications: There are no discontinued medications.   Start the following medications: Meds ordered this encounter  Medications   diphenhydrAMINE (BENADRYL) 2 % cream    Sig: Apply topically 3 (three) times daily as needed for itching.    Dispense:  30 g    Refill:  0     Follow-up:  prn    Please make sure to arrive 15 minutes prior to your next appointment. If you arrive late, you may be asked to reschedule.   We look forward to seeing you next time. Please call our clinic at [phone] if you have any questions or concerns. The best time to call is Monday-Friday from 9am-4pm, but there is someone available 24/7. If after hours or the weekend, call the main hospital number and ask for the Internal Medicine Resident On-Call. If you need medication refills, please notify your pharmacy one week in advance and they will send [place] a request.  Thank you for letting us take part in your care. Wishing you the best!  Thank you, Adron Bene, MD

## 2023-05-11 NOTE — Progress Notes (Signed)
   CC: skin burning / tingling  HPI:  [name] is a 52 y.o. female with past medical history as below who presents for skin burning / tingling. Please see detailed assessment and plan for HPI.  Past Medical History:  Diagnosis Date   Diabetes mellitus    Fibroid    Headache    Herpes    High cholesterol    History of COVID-19 09/10/2020   Hx of gonorrhea 2017   Review of Systems:  Please see detailed assessment and plan for pertinent ROS.  Physical Exam:  Vitals:   05/11/23 1405  BP: (!) 123/52  Pulse: (!) 57  Temp: 98 F (36.7 C)  TempSrc: Oral  SpO2: 100%  Weight: 189 lb 12.8 oz (86.1 kg)  Height: 5\' 7"  (1.702 m)   Physical Exam Constitutional:      General: She is not in acute distress. HENT:     Head: Normocephalic and atraumatic.  Eyes:     Extraocular Movements: Extraocular movements intact.  Pulmonary:     Effort: Pulmonary effort is normal.  Musculoskeletal:     Cervical back: Neck supple.  Skin:    Comments: Bilateral hands without signs of burrowing, erythema, edema, or any visible lesion. No scaling or crusting. No wheals. Sensation intact.  Neurological:     Mental Status: She is alert and oriented to person, place, and time.  Psychiatric:        Cognition and Memory: Cognition and memory normal.      Assessment & Plan:   See Encounters Tab for problem based charting.  Pruritic dermatitis Patient presents today with continued itching and burning at her hands. She denies any resolution of her symptoms despite daily Zyrtec and topical hydrocortisone. She was recently diagnosed with scabies and has been treated with three applications of permethrin cream. She has also cleaned her house according to [facility] guidelines for eradication. Her sons had similar symptoms initially which resolved with treatment. Fortunately, the patient's symptoms at her anus have resolved. On exam, I am unable to identify any signs of current infestation/infection.  It is likely that patient has continued inflammation from recent burrowing of mites.  Will provide supportive care and return precautions. There are no visible lesion for dermatoscopy and I do not feel dermatology referral or ivermectin are warranted at this time. -topical benadryl -hydroxazine prn -return if worsening or reappearance of burrows  Patient discussed with Dr.  Sol Blazing

## 2023-05-11 NOTE — Assessment & Plan Note (Signed)
Patient presents today with continued itching and burning at her hands. She denies any resolution of her symptoms despite daily Zyrtec and topical hydrocortisone. She was recently diagnosed with scabies and has been treated with three applications of permethrin cream. She has also cleaned her house according to [facility] guidelines for eradication. Her sons had similar symptoms initially which resolved with treatment. Fortunately, the patient's symptoms at her anus have resolved. On exam, I am unable to identify any signs of current infestation/infection. It is likely that patient has continued inflammation from recent burrowing of mites.  Will provide supportive care and return precautions. There are no visible lesion for dermatoscopy and I do not feel dermatology referral or ivermectin are warranted at this time. -topical benadryl -hydroxazine prn -return if worsening or reappearance of burrows

## 2023-05-11 NOTE — Telephone Encounter (Signed)
Pt want to know if it's okay to return back to work. Please call back.

## 2023-05-11 NOTE — Telephone Encounter (Addendum)
Patient returned call. States she did permethrin treatment Sunday night 6/9. Hands, back of scalp, and face are still aching, burning, and tingling today. Denies itching or feeling bites. Is aware of refill on permethrin sent yesterday. States her children are asymptomatic. She has done all cleaning as instructed. Confirmed f/u on 6/18 at 0915. Nothing needed at this time.

## 2023-05-11 NOTE — Telephone Encounter (Signed)
Patient notified to come to clinic today at 1345.

## 2023-05-11 NOTE — Telephone Encounter (Addendum)
Patient called back. States she just applied hydrocortisone cream to her hands and now "they are scaling." She thinks that scabies are still present. She is requesting pill to eradicate scabies as she has already done 4 permethrin treatments. No openings in clinic today. Offered appt tomorrow at [phone]. 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

## 2023-05-11 NOTE — Progress Notes (Signed)
Called and updated on results. No signs of infection on UA. Patient reports that she was menstruating when this urine sample was taken which would explain the blood. Could consider repeating UA at next visit when not menstruating to ensure that she does not have hematuria.

## 2023-05-12 DIAGNOSIS — R319 Hematuria, unspecified: Secondary | ICD-10-CM | POA: Insufficient documentation

## 2023-05-12 NOTE — Addendum Note (Signed)
Addended by: Adron Bene on: 05/12/2023 01:23 PM   Modules accepted: Orders

## 2023-05-12 NOTE — Assessment & Plan Note (Signed)
When patient was initially seen for scabies infestation she reported vulvar burning.  The burning has resolved however her urinalysis did show RBCs.  The patient at that time reported she was on her period. -Repeat UA to ensure resolution of possible hematuria

## 2023-05-14 NOTE — Progress Notes (Signed)
Internal Medicine Clinic Attending  Case discussed with Dr. White  At the time of the visit.  We reviewed the resident's history and exam and pertinent patient test results.  I agree with the assessment, diagnosis, and plan of care documented in the resident's note.  

## 2023-05-18 ENCOUNTER — Ambulatory Visit (INDEPENDENT_AMBULATORY_CARE_PROVIDER_SITE_OTHER): Payer: Medicaid Other | Admitting: Internal Medicine

## 2023-05-18 ENCOUNTER — Other Ambulatory Visit (HOSPITAL_COMMUNITY)
Admission: RE | Admit: 2023-05-18 | Discharge: 2023-05-18 | Disposition: A | Discharge status: Home / Self Care | Payer: Medicaid Other | Source: Ambulatory Visit | Attending: Internal Medicine | Admitting: Internal Medicine

## 2023-05-18 VITALS — BP 124/65 | HR 64 | Temp 97.8°F | Ht 67.0 in | Wt 194.6 lb

## 2023-05-18 DIAGNOSIS — N952 Postmenopausal atrophic vaginitis: Secondary | ICD-10-CM | POA: Diagnosis not present

## 2023-05-18 DIAGNOSIS — N9489 Other specified conditions associated with female genital organs and menstrual cycle: Secondary | ICD-10-CM | POA: Diagnosis present

## 2023-05-18 DIAGNOSIS — R319 Hematuria, unspecified: Secondary | ICD-10-CM

## 2023-05-18 MED ORDER — REPLENS VAGINAL MOISTURIZER VA GEL
1.0000 | Freq: Every day | VAGINAL | 0 refills | Status: DC
Start: 2023-05-18 — End: 2023-07-28

## 2023-05-18 NOTE — Assessment & Plan Note (Signed)
She presents due to burning and itching outside and inside vagina as well as peri-anal region. Her greatest concern is if she still has scabies. She has completed 3 treatments for this, last 9 days ago. Burning sensation feels similar to prior BV infection but she typically has discharge with these. In the past she was evaluated by GYN due to burning sensation of vagina. They prescribed lidocaine ointment and steroid cream.  On exam, no tunneling rash appreciated throughout genital area, vaginal tissue looks pink and dry. There is no hypopigmentation.  A: Exam consistent with vulvovaginal atrophy, no signs of hypopigmentation. I do not think exam consistent with lichen sclerosus. P: Trial of vaginal moisturizers for 4 weeks Follow-up, if symptoms persist would consider starting estrogen cream

## 2023-05-18 NOTE — Patient Instructions (Addendum)
Thank you, Ms.TYNISE BUFFA for allowing [place] to provide your care today.  Vaginal burning Please pick up a vaginal moisturizer including Replens, Vagisil Moisturizer, or Feminease. Follow-up next month if symptoms do not improve. The next step would be to try estrogen cream.  I have ordered the following labs for you:  Lab Orders         Urinalysis, Reflex Microscopic       I have ordered the following medication/changed the following medications:   Stop the following medications: There are no discontinued medications.   Start the following medications: Meds ordered this encounter  Medications   Replens Vaginal Moisturizer GEL    Sig: Place 1 Application vaginally at bedtime.    Dispense:  6.7 g    Refill:  0     Follow up: 1 month  We look forward to seeing you next time. Please call our clinic at [phone] if you have any questions or concerns. The best time to call is Monday-Friday from 9am-4pm, but there is someone available 24/7. If after hours or the weekend, call the main hospital number and ask for the Internal Medicine Resident On-Call. If you need medication refills, please notify your pharmacy one week in advance and they will send [place] a request.   Thank you for trusting me with your care. Wishing you the best!   Rudene Christians, DO [facility]

## 2023-05-18 NOTE — Progress Notes (Signed)
Subjective:  CC: burning sensation  HPI:  Tanya Austin is a 52 y.o. female with a past medical history stated below and presents today for burning sensation in genital and rectal region.  She was diagnosed with scabies in mid-May and had undergone 3 treatments with permethrin cream since then, last being 9 days ago.  She was having intense burning in her hand following treatment but this has improved with using benadryl cream and hydoxyzine. Please see problem based assessment and plan for additional details.  Past Medical History:  Diagnosis Date   Diabetes mellitus    Fibroid    Headache    Herpes    High cholesterol    History of COVID-19 09/10/2020   Hx of gonorrhea 2017    Current Outpatient Medications on File Prior to Visit  Medication Sig Dispense Refill   acetaminophen (TYLENOL) 500 MG tablet Take 1,000 mg by mouth 2 (two) times daily as needed for moderate pain.     ALPRAZolam (XANAX) 1 MG tablet Take 1 mg by mouth at bedtime as needed.     Cholecalciferol (VITAMIN D3) 3000 units TABS Take 1 capsule by mouth daily.      conjugated estrogens (PREMARIN) vaginal cream Place 1 Applicatorful vaginally daily. (Patient not taking: Reported on 09/29/2022) 42.5 g 12   cyclobenzaprine (FLEXERIL) 5 MG tablet Take 1 tablet (5 mg total) by mouth 3 (three) times daily as needed for muscle spasms. (Patient not taking: Reported on 09/29/2022) 30 tablet 0   diphenhydrAMINE (BENADRYL) 2 % cream Apply topically 3 (three) times daily as needed for itching. 30 g 0   fish oil-omega-3 fatty acids 1000 MG capsule Take 1 g by mouth daily.     fluticasone (FLONASE) 50 MCG/ACT nasal spray SHAKE LIQUID AND USE 1 SPRAY IN EACH NOSTRIL IN THE MORNING AND AT BEDTIME 48 g 0   hydrocortisone cream 1 % Apply to affected area 2 times daily 14 g 0   hydrOXYzine (ATARAX) 10 MG tablet Take 1 tablet (10 mg total) by mouth 3 (three) times daily as needed for itching. 30 tablet 0   ibuprofen (ADVIL) 800  MG tablet Take 1 tablet (800 mg total) by mouth every 8 (eight) hours as needed. 30 tablet 1   IRON PO Take 1 tablet by mouth daily. otc medication- not sure of dose     permethrin (ELIMITE) 5 % cream APPLY TOPICALLY TO THE AFFECTED AREA 1 TIME FOR 1 DOSE 60 g 0   Probiotic Product (PROBIOTIC DAILY PO) Take 1 capsule by mouth daily. (Patient not taking: Reported on 11/14/2021)     RESTASIS 0.05 % ophthalmic emulsion 1 drop 2 (two) times daily.     simvastatin (ZOCOR) 20 MG tablet TAKE 1 TABLET BY MOUTH AT BEDTIME 180 tablet 1   sitaGLIPtin-metformin (JANUMET) 50-1000 MG tablet Take 1 tablet by mouth daily. 90 tablet 0   triamcinolone ointment (KENALOG) 0.5 % Apply 1 application topically 2 (two) times daily. Bid x 4 wks, then qd x 4 wks, then qod x 4 wks, then 2x/wk 30 g 0   valACYclovir (VALTREX) 500 MG tablet Take 1 tablet daily with no outbreak. Take 1 tablet BID x 3 days with an outbreak 90 tablet 3   [DISCONTINUED] Insulin Glargine (TOUJEO SOLOSTAR) 300 UNIT/ML SOPN Inject 14 Units into the skin at bedtime. (Patient not taking: Reported on 09/10/2020)     [DISCONTINUED] promethazine (PHENERGAN) 25 MG tablet Take 1 tablet (25 mg total) by mouth  every 6 (six) hours as needed for nausea or vomiting. (Patient not taking: Reported on 09/10/2020) 30 tablet 0   No current facility-administered medications on file prior to visit.    Family History  Problem Relation Age of Onset   Hypertension Mother    Diabetes Mother    Hypertension Father    Hypertension Other    Hyperlipidemia Other    Heart disease Other     Social History   Socioeconomic History   Marital status: Single    Spouse name: Not on file   Number of children: Not on file   Years of education: Not on file   Highest education level: Not on file  Occupational History   Not on file  Tobacco Use   Smoking status: Former    Types: Cigarettes    Quit date: 06/09/2000    Years since quitting: 22.9   Smokeless tobacco: Never   Vaping Use   Vaping Use: Never used  Substance and Sexual Activity   Alcohol use: No    Comment: Socially on weekends   Drug use: No    Types: Marijuana   Sexual activity: Yes    Birth control/protection: None    Comment: Trinessa  Other Topics Concern   Not on file  Social History Narrative   Not on file   Social Determinants of Health   Financial Resource Strain: Not on file  Food Insecurity: Not on file  Transportation Needs: Not on file  Physical Activity: Not on file  Stress: Not on file  Social Connections: Not on file  Intimate Partner Violence: Not on file    Review of Systems: ROS negative except for what is noted on the assessment and plan.  Objective:   Vitals:   05/18/23 0919  BP: 124/65  Pulse: 64  Temp: 97.8 F (36.6 C)  TempSrc: Oral  SpO2: 100%  Weight: 194 lb 9.6 oz (88.3 kg)  Height: 5\' 7"  (1.702 m)    Physical Exam: Constitutional: well-appearing Cardiovascular: regular rate and rhythm, no m/r/g Pulmonary/Chest: normal work of breathing on room air, lungs clear to auscultation bilaterally GU: pale, dry vaginal epithelium, no discharge present, no rash present  Female nursing staff present during patient's pelvic examination    Assessment & Plan:  Vulvar burning She presents due to burning and itching outside and inside vagina as well as peri-anal region. Her greatest concern is if she still has scabies. She has completed 3 treatments for this, last 9 days ago. Burning sensation feels similar to prior BV infection but she typically has discharge with these. In the past she was evaluated by GYN due to burning sensation of vagina. They prescribed lidocaine ointment and steroid cream.  On exam, no tunneling rash appreciated throughout genital area, vaginal tissue looks pink and dry. There is no hypopigmentation.  A: Exam consistent with vulvovaginal atrophy, no signs of hypopigmentation. I do not think exam consistent with lichen  sclerosus. P: Trial of vaginal moisturizers for 4 weeks Follow-up, if symptoms persist would consider starting estrogen cream   Patient discussed with Dr. Precious Bard Antania Hoefling, D.O. [facility]  PGY-[id number] Pager: [phone]  Phone: [phone] Date 05/18/2023  Time 6:07 PM

## 2023-05-19 LAB — URINALYSIS, ROUTINE W REFLEX MICROSCOPIC
Bilirubin, UA: NEGATIVE
Glucose, UA: NEGATIVE
Ketones, UA: NEGATIVE
Leukocytes,UA: NEGATIVE
Nitrite, UA: NEGATIVE
Protein,UA: NEGATIVE
RBC, UA: NEGATIVE
Specific Gravity, UA: 1.024 (ref 1.005–1.030)
Urobilinogen, Ur: 0.2 mg/dL (ref 0.2–1.0)
pH, UA: 6 (ref 5.0–7.5)

## 2023-05-19 LAB — CERVICOVAGINAL ANCILLARY ONLY
Bacterial Vaginitis (gardnerella): NEGATIVE
Comment: NEGATIVE

## 2023-05-20 ENCOUNTER — Other Ambulatory Visit (HOSPITAL_COMMUNITY): Payer: Self-pay

## 2023-05-20 ENCOUNTER — Telehealth: Payer: Self-pay

## 2023-05-20 NOTE — Telephone Encounter (Signed)
PA decision: N/A  Reason: the requested product has been previously approved under [id number]. Based on the information reviewed, the requested prescription is currently authorized for coverage by the plan until 03/25/2024

## 2023-05-20 NOTE — Telephone Encounter (Signed)
A Prior Authorization was initiated for this patients JANUMET through CoverMyMeds.   (No history of metformin failure - only discontinued due to reaching A1C goal in past)  Key: [id number]

## 2023-05-20 NOTE — Progress Notes (Signed)
Internal Medicine Clinic Attending  Case discussed with Dr. Masters  At the time of the visit.  We reviewed the resident's history and exam and pertinent patient test results.  I agree with the assessment, diagnosis, and plan of care documented in the resident's note.  

## 2023-05-25 ENCOUNTER — Encounter: Payer: Self-pay | Admitting: Student

## 2023-05-25 ENCOUNTER — Ambulatory Visit (INDEPENDENT_AMBULATORY_CARE_PROVIDER_SITE_OTHER): Payer: Medicaid Other | Admitting: Student

## 2023-05-25 VITALS — BP 117/71 | HR 64 | Temp 98.1°F | Ht 67.0 in | Wt 192.7 lb

## 2023-05-25 DIAGNOSIS — L308 Other specified dermatitis: Secondary | ICD-10-CM

## 2023-05-25 NOTE — Patient Instructions (Addendum)
Tanya Austin,  It was a pleasure seeing you in the clinic today.   It does not appear that you have an active scabies infection. If you notice any intense itching or burrowing like we discussed, then please let us know.  Please come back to see [place] in 3 months (or sooner if needed).  Please call our clinic at [phone] if you have any questions or concerns. The best time to call is Monday-Friday from 9am-4pm, but there is someone available 24/7 at the same number. If you need medication refills, please notify your pharmacy one week in advance and they will send [place] a request.   Thank you for letting us take part in your care. We look forward to seeing you next time!

## 2023-05-25 NOTE — Progress Notes (Signed)
   CC: scabies f/u  HPI:  [name] is a 52 y.o. female with history listed below presenting to the [facility] for scabies f/u. Please see individualized problem based charting for full HPI.  Past Medical History:  Diagnosis Date   Diabetes mellitus    Dizziness 01/06/2022   Ear pain, right 06/24/2022   Fibroid    Headache    Herpes    High cholesterol    History of COVID-19 09/10/2020   Hx of gonorrhea 2017   Stomach pain 06/24/2022    Review of Systems:  Negative aside from that listed in individualized problem based charting.  Physical Exam:  Vitals:   05/25/23 1452 05/25/23 1500  BP: (!) 143/56 117/71  Pulse: 63 64  Temp: 98.1 F (36.7 C)   TempSrc: Oral   SpO2: 100%   Weight: 192 lb 11.2 oz (87.4 kg)   Height: 5\' 7"  (1.702 m)    Physical Exam Constitutional:      Appearance: Normal appearance. She is obese. She is not ill-appearing.  HENT:     Mouth/Throat:     Mouth: Mucous membranes are moist.     Pharynx: Oropharynx is clear.  Eyes:     General: No scleral icterus.    Extraocular Movements: Extraocular movements intact.     Conjunctiva/sclera: Conjunctivae normal.  Cardiovascular:     Rate and Rhythm: Normal rate and regular rhythm.     Heart sounds: Normal heart sounds. No murmur heard.    No friction rub. No gallop.  Pulmonary:     Effort: Pulmonary effort is normal.     Breath sounds: Normal breath sounds. No wheezing, rhonchi or rales.  Abdominal:     General: Bowel sounds are normal. There is no distension.     Palpations: Abdomen is soft.     Tenderness: There is no abdominal tenderness.  Musculoskeletal:        General: No swelling.  Skin:    General: Skin is warm and dry.     Findings: No erythema, lesion or rash.     Comments: No vesicles, papules, burrows, scaling noted on skin.  Neurological:     General: No focal deficit present.     Mental Status: She is alert.  Psychiatric:        Mood and Affect: Mood normal.         Behavior: Behavior normal.      Assessment & Plan:   See Encounters Tab for problem based charting.  Patient discussed with Dr.  Sol Blazing

## 2023-05-25 NOTE — Assessment & Plan Note (Signed)
Patient presented to clinic today with concerns for scabies reinfection. She was diagnosed with scabies a couple of weeks ago and has since received 3 applications of permethrin cream for treatment of initial infection and reinfection. She presented today to ensure that she has not gotten reinfected because she feels crawling sensations on her skin. She denies any intense itching, burrows or other lesions, or symptoms in family members. Overall, no symptoms consistent with scabies.   On exam, she does not have any papules, vesicles, burrows, scaling in her fingers, wrists, elbows, knees, buttocks.   She does endorse feeling very nervous and anxious about having a reinfection. Small things on her skin make her think that she became reinfected and she does not wish to spread it to anybody else which prompts her to come in for evaluation. I provided reassurance that she does not have an active scabies infection. Discussed typical findings in scabies infections and provided pamphlet to reinforce education and return precautions.

## 2023-05-25 NOTE — Addendum Note (Signed)
Addended by: Merrilyn Puma on: 05/25/2023 04:07 PM   Modules accepted: Level of Service

## 2023-05-28 NOTE — Progress Notes (Signed)
Internal Medicine Clinic Attending  Case discussed with Dr. Jinwala  At the time of the visit.  We reviewed the resident's history and exam and pertinent patient test results.  I agree with the assessment, diagnosis, and plan of care documented in the resident's note.  

## 2023-06-08 ENCOUNTER — Other Ambulatory Visit: Payer: Self-pay | Admitting: Student

## 2023-06-08 DIAGNOSIS — B86 Scabies: Secondary | ICD-10-CM

## 2023-06-08 DIAGNOSIS — L308 Other specified dermatitis: Secondary | ICD-10-CM

## 2023-06-11 ENCOUNTER — Telehealth: Payer: Self-pay

## 2023-06-11 NOTE — Telephone Encounter (Signed)
Patient notified that Rx has been sent. Confirmed appt on 7/16 at 0945.

## 2023-06-11 NOTE — Telephone Encounter (Signed)
Please refer to refill encounter for Atarax started on 06/08/23

## 2023-06-11 NOTE — Telephone Encounter (Signed)
Pt would like her medication to be filled by today.

## 2023-06-15 ENCOUNTER — Encounter: Payer: Self-pay | Admitting: Student

## 2023-06-15 ENCOUNTER — Other Ambulatory Visit: Payer: Self-pay

## 2023-06-15 ENCOUNTER — Ambulatory Visit: Payer: Medicaid Other | Admitting: Student

## 2023-06-15 VITALS — BP 116/63 | HR 65 | Temp 97.1°F | Ht 67.0 in | Wt 194.5 lb

## 2023-06-15 DIAGNOSIS — Z7984 Long term (current) use of oral hypoglycemic drugs: Secondary | ICD-10-CM | POA: Diagnosis not present

## 2023-06-15 DIAGNOSIS — Z862 Personal history of diseases of the blood and blood-forming organs and certain disorders involving the immune mechanism: Secondary | ICD-10-CM | POA: Diagnosis not present

## 2023-06-15 DIAGNOSIS — E119 Type 2 diabetes mellitus without complications: Secondary | ICD-10-CM | POA: Diagnosis not present

## 2023-06-15 DIAGNOSIS — R1310 Dysphagia, unspecified: Secondary | ICD-10-CM

## 2023-06-15 DIAGNOSIS — Z0000 Encounter for general adult medical examination without abnormal findings: Secondary | ICD-10-CM

## 2023-06-15 DIAGNOSIS — R634 Abnormal weight loss: Secondary | ICD-10-CM

## 2023-06-15 DIAGNOSIS — K5900 Constipation, unspecified: Secondary | ICD-10-CM

## 2023-06-15 DIAGNOSIS — R1319 Other dysphagia: Secondary | ICD-10-CM | POA: Insufficient documentation

## 2023-06-15 DIAGNOSIS — M5431 Sciatica, right side: Secondary | ICD-10-CM

## 2023-06-15 DIAGNOSIS — M5432 Sciatica, left side: Secondary | ICD-10-CM | POA: Diagnosis not present

## 2023-06-15 DIAGNOSIS — E785 Hyperlipidemia, unspecified: Secondary | ICD-10-CM

## 2023-06-15 DIAGNOSIS — N952 Postmenopausal atrophic vaginitis: Secondary | ICD-10-CM | POA: Diagnosis not present

## 2023-06-15 DIAGNOSIS — E6609 Other obesity due to excess calories: Secondary | ICD-10-CM | POA: Insufficient documentation

## 2023-06-15 MED ORDER — PREMARIN 0.625 MG/GM VA CREA
1.0000 | TOPICAL_CREAM | Freq: Every day | VAGINAL | 12 refills | Status: DC
Start: 2023-06-15 — End: 2024-01-26

## 2023-06-15 MED ORDER — IBUPROFEN 800 MG PO TABS
800.0000 mg | ORAL_TABLET | Freq: Three times a day (TID) | ORAL | 1 refills | Status: DC | PRN
Start: 2023-06-15 — End: 2023-07-28

## 2023-06-15 MED ORDER — METAMUCIL SMOOTH TEXTURE 58.6 % PO POWD: 1.0000 | Freq: Every day | ORAL | Status: DC

## 2023-06-15 NOTE — Assessment & Plan Note (Signed)
Medication reconciliation completed: yes Medications present with patient: no Barriers to med rec: none Patient reminded to bring medications to all doctor visits.

## 2023-06-15 NOTE — Assessment & Plan Note (Signed)
No red flags. Don't think weight loss is related. Overall feels quite well, good energy level, but somewhat limited by pain. Impression is for mechanical or benign musculoskeletal back pain with some L5-S1 nerve root impingement based on distribution of pain down back of legs. No signs or symptoms of infection, nothing on ROS or exam to suggest cord compression. No imaging for this low-risk back pain at this point. Treat with acetaminophen and ibuprofen for now. Would benefit from duloxetine if pain persists.

## 2023-06-15 NOTE — Assessment & Plan Note (Signed)
Chronic, mild constipation. Takes iron supplement for history of iron deficiency anemia. Repeat CBC and iron studies, plan to discontinue oral iron pending results.

## 2023-06-15 NOTE — Patient Instructions (Signed)
Remember to bring all of the medications that you take (including over the counter medications and supplements) with you to every clinic visit.  This after visit summary is an important review of tests, referrals, and medication changes that were discussed during your visit. If you have questions or concerns, call [phone]. Outside of clinic business hours, call the main hospital at [phone] and ask the operator for the on-call internal medicine resident.   Marrianne Mood MD 06/15/2023, 10:55 AM

## 2023-06-15 NOTE — Assessment & Plan Note (Signed)
Dysphagia for a couple of years now. Mostly solid foods but no specific food triggers like meat, bread, etc. No choking or coughing, feeling like food is stuck in the chest after swallowing. Broad lab workup and GI referral for esophageal dysphagia, possibly associated with some weight loss.

## 2023-06-15 NOTE — Assessment & Plan Note (Signed)
Overall borderline ASCVD risk score at 4.6%. Continue simvastatin 20 mg, reports good adherence and well-tolerated.

## 2023-06-15 NOTE — Progress Notes (Signed)
Subjective:  Tanya Austin is a 52 y.o. who presents to clinic for the following:  Follow-up (4 wk ) and Back Pain (Lower back pain that shots down both of her legs .Marland Kitchen Pt stated that it has been going on now for  a few months ...)  "Sciatica." Ongoing for 5-6 months. Right and left low back, radiates down back of legs. Constant pain. Feels like an electric pain. Keeping her up at night. Making it hard to exercise, even walking is difficult. 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 No work injuries or other injuries, doesn't suspect back injury. Never suffered from back pain before.   Review of Systems  Constitutional:  Positive for weight loss. Negative for chills, diaphoresis, fever and malaise/fatigue.  Respiratory:  Negative for cough and shortness of breath.   Cardiovascular:  Negative for chest pain and leg swelling.  Gastrointestinal:  Positive for constipation. Negative for abdominal pain, blood in stool, heartburn and melena.       Food sticking in chest  Genitourinary:  Negative for dysuria, flank pain, frequency, hematuria and urgency.  Musculoskeletal:  Positive for joint pain (Knees, chronic). Negative for falls and myalgias.  Skin:  Negative for rash.  Neurological:  Negative for dizziness, focal weakness, weakness and headaches.  Endo/Heme/Allergies:  Does not bruise/bleed easily.  Psychiatric/Behavioral:  Negative for depression.    Personal and family history negative for cancer.  Objective:   Vitals:   06/15/23 0950  BP: 116/63  Pulse: 65  Temp: (!) 97.1 F (36.2 C)  TempSrc: Oral  SpO2: 100%  Weight: 194 lb 8 oz (88.2 kg)  Height: 5\' 7"  (1.702 m)    Physical Exam Well-appearing, no distress Neck supple, no cervical lymphadenopathy, no palpable thyromegaly or nodules Oropharynx is moist and pink Heart rate normal, rhythm regular, radial pulses are strong, no appreciable murmurs, no LE edema Breathing  regular and unlabored, no wheezing or crackles Skin warm and dry, no rashes on exposed areas Abdomen non-tender No midline spinal tenderness or paraspinal tenderness, no pain with straight leg raise testing on either side No CVA tenderness Alert and oriented, gait observed is normal, speech is normal sounding, LE patellar and achilles reflexes normal bilaterally, no tremor Pleasant, concordant affect  Assessment & Plan:   Problem List Items Addressed This Visit     Diabetes (HCC) - Primary (Chronic)    Reports good adherence to sitagliptin-metformin. A1c at last visit stable at 6.3. No changes today.      Relevant Orders   Microalbumin / Creatinine Urine Ratio   Hyperlipidemia (Chronic)    Overall borderline ASCVD risk score at 4.6%. Continue simvastatin 20 mg, reports good adherence and well-tolerated.       Relevant Orders   Lipid Profile   Healthcare maintenance    Medication reconciliation completed: yes Medications present with patient: no Barriers to med rec: none Patient reminded to bring medications to all doctor visits.       Esophageal dysphagia    Dysphagia for a couple of years now. Mostly solid foods but no specific food triggers like meat, bread, etc. No choking or coughing, feeling like food is stuck in the chest after swallowing. Broad lab workup and GI referral for esophageal dysphagia, possibly associated with some weight loss.      Relevant Orders   CBC no Diff   CMP14 + Anion Gap   Ambulatory referral to Gastroenterology   Constipation  Chronic, mild constipation. Takes iron supplement for history of iron deficiency anemia. Repeat CBC and iron studies, plan to discontinue oral iron pending results.      Relevant Medications   psyllium (METAMUCIL SMOOTH TEXTURE) 58.6 % powder   Vaginal atrophy    Chronic problem. She wishes to trial vaginal estrogen therapy, which I will prescribe.      Relevant Medications   conjugated estrogens (PREMARIN)  vaginal cream   Bilateral sciatica (Chronic)    >>ASSESSMENT AND PLAN FOR LOW BACK PAIN WITHOUT SCIATICA WRITTEN ON 06/15/2023  1:06 PM BY Sherri Mcarthy, MD  No red flags. Don't think weight loss is related. Overall feels quite well, good energy level, but somewhat limited by pain. Impression is for mechanical or benign musculoskeletal back pain with some L5-S1 nerve root impingement based on distribution of pain down back of legs. No signs or symptoms of infection, nothing on ROS or exam to suggest cord compression. No imaging for this low-risk back pain at this point. Treat with acetaminophen and ibuprofen for now. Would benefit from duloxetine if pain persists.      Relevant Medications   ibuprofen (ADVIL) 800 MG tablet   Other Relevant Orders   Ambulatory referral to Physical Therapy   Other Visit Diagnoses     History of anemia       Relevant Orders   CBC no Diff   Iron, TIBC and Ferritin Panel   Weight loss       Relevant Orders   TSH       Return in 2-3 months for follow-up of back pain, weight loss, dysphagia, diabetes.  Patient discussed with Dr. Lawernce Keas MD 06/15/2023, 1:18 PM  [phone]

## 2023-06-15 NOTE — Assessment & Plan Note (Signed)
Chronic problem. She wishes to trial vaginal estrogen therapy, which I will prescribe.

## 2023-06-15 NOTE — Assessment & Plan Note (Signed)
>>  ASSESSMENT AND PLAN FOR LOW BACK PAIN WITHOUT SCIATICA WRITTEN ON 06/15/2023  1:06 PM BY Sayan Aldava, MD  No red flags. Don't think weight loss is related. Overall feels quite well, good energy level, but somewhat limited by pain. Impression is for mechanical or benign musculoskeletal back pain with some L5-S1 nerve root impingement based on distribution of pain down back of legs. No signs or symptoms of infection, nothing on ROS or exam to suggest cord compression. No imaging for this low-risk back pain at this point. Treat with acetaminophen and ibuprofen for now. Would benefit from duloxetine if pain persists.

## 2023-06-15 NOTE — Assessment & Plan Note (Signed)
Reports good adherence to sitagliptin-metformin. A1c at last visit stable at 6.3. No changes today.

## 2023-06-16 LAB — IRON,TIBC AND FERRITIN PANEL
Ferritin: 27 ng/mL (ref 15–150)
Iron Saturation: 21 % (ref 15–55)
Iron: 77 ug/dL (ref 27–159)
Total Iron Binding Capacity: 363 ug/dL (ref 250–450)
UIBC: 286 ug/dL (ref 131–425)

## 2023-06-16 LAB — CBC
Hematocrit: 36.9 % (ref 34.0–46.6)
Hemoglobin: 11.9 g/dL (ref 11.1–15.9)
MCH: 30.3 pg (ref 26.6–33.0)
MCHC: 32.2 g/dL (ref 31.5–35.7)
MCV: 94 fL (ref 79–97)
Platelets: 229 10*3/uL (ref 150–450)
RBC: 3.93 x10E6/uL (ref 3.77–5.28)
RDW: 12 % (ref 11.7–15.4)
WBC: 5.5 10*3/uL (ref 3.4–10.8)

## 2023-06-16 LAB — CMP14 + ANION GAP
ALT: 9 IU/L (ref 0–32)
AST: 11 IU/L (ref 0–40)
Albumin: 3.9 g/dL (ref 3.9–4.9)
Alkaline Phosphatase: 59 IU/L (ref 44–121)
Anion Gap: 12 mmol/L (ref 10.0–18.0)
BUN/Creatinine Ratio: 19 (ref 9–23)
BUN: 14 mg/dL (ref 6–24)
Bilirubin Total: 0.3 mg/dL (ref 0.0–1.2)
CO2: 23 mmol/L (ref 20–29)
Calcium: 9.6 mg/dL (ref 8.7–10.2)
Chloride: 107 mmol/L — ABNORMAL HIGH (ref 96–106)
Creatinine, Ser: 0.74 mg/dL (ref 0.57–1.00)
Globulin, Total: 2.4 g/dL (ref 1.5–4.5)
Glucose: 87 mg/dL (ref 70–99)
Potassium: 5 mmol/L (ref 3.5–5.2)
Sodium: 142 mmol/L (ref 134–144)
Total Protein: 6.3 g/dL (ref 6.0–8.5)
eGFR: 99 mL/min/{1.73_m2} (ref >59)

## 2023-06-16 LAB — LIPID PANEL
Chol/HDL Ratio: 3.2 ratio (ref 0.0–4.4)
Cholesterol, Total: 155 mg/dL (ref 100–199)
HDL: 49 mg/dL (ref >39)
LDL Chol Calc (NIH): 94 mg/dL (ref 0–99)
Triglycerides: 61 mg/dL (ref 0–149)
VLDL Cholesterol Cal: 12 mg/dL (ref 5–40)

## 2023-06-16 LAB — MICROALBUMIN / CREATININE URINE RATIO
Creatinine, Urine: 198.3 mg/dL
Microalb/Creat Ratio: 4 mg/g creat (ref 0–29)
Microalbumin, Urine: 7.5 ug/mL

## 2023-06-16 NOTE — Progress Notes (Signed)
Internal Medicine Clinic Attending  Case discussed with the resident physician at the time of the visit.  We reviewed the patient's history, exam, and pertinent patient test results.  I agree with the assessment, diagnosis, and plan of care documented in the resident's note.

## 2023-06-17 LAB — SPECIMEN STATUS REPORT

## 2023-06-17 LAB — TSH: TSH: 0.682 u[IU]/mL (ref 0.450–4.500)

## 2023-06-28 NOTE — Addendum Note (Signed)
Addended by: Bufford Spikes on: 06/28/2023 05:00 PM   Modules accepted: Orders

## 2023-07-28 ENCOUNTER — Ambulatory Visit (HOSPITAL_COMMUNITY)
Admission: EM | Admit: 2023-07-28 | Discharge: 2023-07-28 | Disposition: A | Discharge status: Home / Self Care | Payer: Medicaid Other | Attending: Family Medicine | Admitting: Family Medicine

## 2023-07-28 ENCOUNTER — Ambulatory Visit (HOSPITAL_COMMUNITY): Payer: Medicaid Other

## 2023-07-28 ENCOUNTER — Encounter (HOSPITAL_COMMUNITY): Payer: Self-pay

## 2023-07-28 ENCOUNTER — Ambulatory Visit (INDEPENDENT_AMBULATORY_CARE_PROVIDER_SITE_OTHER): Payer: Medicaid Other

## 2023-07-28 DIAGNOSIS — M79642 Pain in left hand: Secondary | ICD-10-CM | POA: Diagnosis not present

## 2023-07-28 MED ORDER — NAPROXEN 375 MG PO TABS: 375.0000 mg | ORAL_TABLET | Freq: Two times a day (BID) | ORAL | 0 refills | Status: DC

## 2023-07-28 MED ORDER — PREDNISONE 20 MG PO TABS: 40.0000 mg | ORAL_TABLET | Freq: Every day | ORAL | 0 refills | Status: DC

## 2023-07-28 NOTE — ED Provider Notes (Signed)
[facility]   [id number] 07/28/23 Arrival Time: 1246  ASSESSMENT & PLAN:  1. Left hand pain    I have personally viewed and independently interpreted the imaging studies ordered this visit. No appreciable fractures of L hand.   Discharge Medication List as of 07/28/2023  3:19 PM     START taking these medications   Details  naproxen (NAPROSYN) 375 MG tablet Take 1 tablet (375 mg total) by mouth 2 (two) times daily with a meal., Starting Wed 07/28/2023, Normal    predniSONE (DELTASONE) 20 MG tablet Take 2 tablets (40 mg total) by mouth daily., Starting Wed 07/28/2023, Normal        Orders Placed This Encounter  Procedures      DG Hand Complete Left   Reviewed expectations re: course of current medical issues. Questions answered. Outlined signs and symptoms indicating need for more acute intervention. Patient verbalized understanding. After Visit Summary given.  SUBJECTIVE: History from: patient. Tanya Austin is a 52 y.o. female who reports left hand pain and swelling upon waking few days ago. More so in the 5th digit and just below. Patient has decreased ROM in the left 5th finger. No known injury. She has been taking Tylenol and IBU with some relief but pain is constant.   Past Surgical History:  Procedure Laterality Date   CERVICAL BIOPSY        OBJECTIVE:  Vitals:   07/28/23 1354 07/28/23 1355  BP:  129/78  Pulse:  65  Resp:  16  Temp:  98.4 F (36.9 C)  TempSrc:  Oral  SpO2:  98%  Weight: 86.6 kg   Height: 5' 6.25" (1.683 m)     General appearance: alert; no distress HEENT: [place]; AT Neck: supple with FROM Resp: unlabored respirations Extremities: LUE: warm with well perfused appearance; poorly localized mild tenderness over left hand, mostly over 5th metacarpal; without gross deformities; swelling: none; bruising: none; wrist and finger ROM CV: brisk extremity capillary refill of LLE; 2+ radial pulse of LUE. Skin: warm and dry; no  visible rashes Neurologic: gait normal; normal sensation and strength of LUE Psychological: alert and cooperative; normal mood and affect  Imaging: DG Hand Complete Left  Result Date: 07/28/2023 CLINICAL DATA:  Pain and swelling around fifth metatarsal and fifth finger. EXAM: LEFT HAND - COMPLETE 3+ VIEW COMPARISON:  None Available. FINDINGS: Normal bone mineralization. Neutral ulnar variance. The cortices are intact. No acute fracture or dislocation. Joint spaces are preserved. IMPRESSION: No acute fracture or dislocation. Electronically Signed   By: Neita Garnet M.D.   On: 07/28/2023 15:39      No Known Allergies  Past Medical History:  Diagnosis Date   Diabetes mellitus    Dizziness 01/06/2022   Ear pain, right 06/24/2022   Fibroid    Headache    Herpes    High cholesterol    History of COVID-19 09/10/2020   Hx of gonorrhea 2017   Stomach pain 06/24/2022   Social History   Socioeconomic History   Marital status: Single    Spouse name: Not on file   Number of children: Not on file   Years of education: Not on file   Highest education level: Not on file  Occupational History   Not on file  Tobacco Use   Smoking status: Former    Current packs/day: 0.00    Types: Cigarettes    Quit date: 06/09/2000    Years since quitting: 23.1   Smokeless tobacco: Never  Vaping Use   Vaping status: Never Used  Substance and Sexual Activity   Alcohol use: No   Drug use: No    Types: Marijuana   Sexual activity: Yes    Birth control/protection: None    Comment: Trinessa  Other Topics Concern   Not on file  Social History Narrative   Not on file   Social Determinants of Health   Financial Resource Strain: Not on file  Food Insecurity: Not on file  Transportation Needs: Not on file  Physical Activity: Not on file  Stress: Not on file  Social Connections: Not on file   Family History  Problem Relation Age of Onset   Hypertension Mother    Diabetes Mother     Hypertension Father    Hypertension Other    Hyperlipidemia Other    Heart disease Other    Past Surgical History:  Procedure Laterality Date   CERVICAL BIOPSY         Mardella Layman, MD 07/28/23 [phone]

## 2023-07-28 NOTE — ED Triage Notes (Signed)
Patient here today with c/o left hand pain and swelling since Sunday upon waking. More so in the 5th digit and just below. Patient has decreased ROM in the left 5th finger. No known injury. She has been taking Tylenol and IBU with some relief but pain is constant.

## 2023-08-17 ENCOUNTER — Ambulatory Visit (INDEPENDENT_AMBULATORY_CARE_PROVIDER_SITE_OTHER): Payer: Medicaid Other | Admitting: Internal Medicine

## 2023-08-17 ENCOUNTER — Encounter: Payer: Self-pay | Admitting: Internal Medicine

## 2023-08-17 VITALS — BP 128/68 | HR 70 | Temp 98.3°F | Wt 193.0 lb

## 2023-08-17 DIAGNOSIS — M5431 Sciatica, right side: Secondary | ICD-10-CM

## 2023-08-17 DIAGNOSIS — E119 Type 2 diabetes mellitus without complications: Secondary | ICD-10-CM

## 2023-08-17 DIAGNOSIS — E1169 Type 2 diabetes mellitus with other specified complication: Secondary | ICD-10-CM | POA: Diagnosis present

## 2023-08-17 DIAGNOSIS — R1319 Other dysphagia: Secondary | ICD-10-CM

## 2023-08-17 DIAGNOSIS — E785 Hyperlipidemia, unspecified: Secondary | ICD-10-CM | POA: Diagnosis not present

## 2023-08-17 DIAGNOSIS — M5432 Sciatica, left side: Secondary | ICD-10-CM

## 2023-08-17 LAB — POCT GLYCOSYLATED HEMOGLOBIN (HGB A1C): Hemoglobin A1C: 6.2 % — AB (ref 4.0–5.6)

## 2023-08-17 LAB — GLUCOSE, CAPILLARY: Glucose-Capillary: 176 mg/dL — ABNORMAL HIGH (ref 70–99)

## 2023-08-17 MED ORDER — METFORMIN HCL 1000 MG PO TABS
1000.0000 mg | ORAL_TABLET | Freq: Every day | ORAL | 10 refills | Status: DC
Start: 2023-08-17 — End: 2024-03-13

## 2023-08-17 NOTE — Assessment & Plan Note (Signed)
Patient's dysphagia has continued. [facility] sent a message on MyChart, which patient did not receive. Message and phone number for [place] were copied to AVS for patient to schedule appointment.

## 2023-08-17 NOTE — Assessment & Plan Note (Signed)
A1c today is 6.2. Current regimen is sitagliptin-metformin 50-1000, patient is very motivated to decrease regimen. Urine MCR at last visit was within normal limits.  -After discussion, patient can come off Janumet and take only Metformin 1000mg 

## 2023-08-17 NOTE — Patient Instructions (Addendum)
Tanya Austin,   It was a pleasure seeing you today.   For your diabetes, I will write a prescription for Metformin 1000mg  to be taken once a day. Well done having a lower A1c today.   For your back pain, I am ordering an MRI and putting in a referral for physical therapy.   We will plan on seeing you again in about 6 months for follow up.   Here is the message from the GI clinic  Good morning Tanya Austin,   Our office received a referral from Dr. Erlinda Hong, M.D. to schedule an appointment. If you will give our office a call at your convenience to discuss scheduling at [phone] option 1   Thank you, [facility]

## 2023-08-17 NOTE — Assessment & Plan Note (Signed)
Patient is still having significant lower back pain radiating down entire posterior and anterior legs. She denies weakness, numbness, urinary or bowel incontinence. She does report the pain is worsened when she is walking uphill and lessened by walking downhill or on level ground. This is concerning for possible lumbar spinal stenosis.  -MRI lumbar spine ordered -PT referral placed

## 2023-08-17 NOTE — Progress Notes (Signed)
Subjective:  CC: f/u sciatic, dysphagia, DM  HPI:  [name] is a 52 y.o. female with a past medical history stated below and presents today for above. Please see problem based assessment and plan for additional details.  Past Medical History:  Diagnosis Date   Diabetes mellitus    Dizziness 01/06/2022   Ear pain, right 06/24/2022   Fibroid    Headache    Herpes    High cholesterol    History of COVID-19 09/10/2020   Hx of gonorrhea 2017   Stomach pain 06/24/2022    Current Outpatient Medications on File Prior to Visit  Medication Sig Dispense Refill   acetaminophen (TYLENOL) 500 MG tablet Take 1,000 mg by mouth 2 (two) times daily as needed for moderate pain.     Cholecalciferol (VITAMIN D3) 3000 units TABS Take 1 capsule by mouth daily.      conjugated estrogens (PREMARIN) vaginal cream Place 1 Applicatorful vaginally daily. 42.5 g 12   fish oil-omega-3 fatty acids 1000 MG capsule Take 1 g by mouth daily.     fluticasone (FLONASE) 50 MCG/ACT nasal spray SHAKE LIQUID AND USE 1 SPRAY IN EACH NOSTRIL IN THE MORNING AND AT BEDTIME 48 g 0   naproxen (NAPROSYN) 375 MG tablet Take 1 tablet (375 mg total) by mouth 2 (two) times daily with a meal. 14 tablet 0   predniSONE (DELTASONE) 20 MG tablet Take 2 tablets (40 mg total) by mouth daily. 10 tablet 0   psyllium (METAMUCIL SMOOTH TEXTURE) 58.6 % powder Take 1 packet by mouth daily.     RESTASIS 0.05 % ophthalmic emulsion 1 drop 2 (two) times daily.     simvastatin (ZOCOR) 20 MG tablet TAKE 1 TABLET BY MOUTH AT BEDTIME 180 tablet 1   valACYclovir (VALTREX) 500 MG tablet Take 1 tablet daily with no outbreak. Take 1 tablet BID x 3 days with an outbreak 90 tablet 3   No current facility-administered medications on file prior to visit.    Review of Systems: ROS negative except for as is noted on the assessment and plan.  Objective:   Vitals:   08/17/23 0942  BP: 128/68  Pulse: 70  Temp: 98.3 F (36.8 C)   TempSrc: Oral  SpO2: 100%  Weight: 193 lb (87.5 kg)    Physical Exam: Constitutional: well-appearing, in no acute distress HENT: normocephalic atraumatic, mucous membranes moist Eyes: conjunctiva non-erythematous Neck: supple Cardiovascular: regular rate and rhythm, no m/r/g Pulmonary/Chest: normal work of breathing on room air, lungs clear to auscultation bilaterally Abdominal: soft, non-tender, non-distended MSK: normal bulk and tone Neurological: alert & oriented x 3, 5/5 strength in bilateral upper and lower extremities, normal gait Skin: warm and dry  Assessment & Plan:   Type 2 diabetes mellitus with hyperlipidemia (HCC) A1c today is 6.2. Current regimen is sitagliptin-metformin 50-1000, patient is very motivated to decrease regimen. Urine MCR at last visit was within normal limits.  -After discussion, patient can come off Janumet and take only Metformin 1000mg   Bilateral sciatica Patient is still having significant lower back pain radiating down entire posterior and anterior legs. She denies weakness, numbness, urinary or bowel incontinence. She does report the pain is worsened when she is walking uphill and lessened by walking downhill or on level ground. This is concerning for possible lumbar spinal stenosis.  -MRI lumbar spine ordered -PT referral placed  Esophageal dysphagia Patient's dysphagia has continued. [facility] sent a message on MyChart, which patient did not receive. Message  and phone number for [place] were copied to AVS for patient to schedule appointment.     Patient seen with Dr. Pershing Proud MD [facility]  PGY-1 Pager: [phone] Date 08/17/2023  Time 10:31 AM note

## 2023-08-18 NOTE — Progress Notes (Signed)
Internal Medicine Clinic Attending  I was physically present during the key portions of the resident provided service and participated in the medical decision making of patient's management care. I reviewed pertinent patient test results.  The assessment, diagnosis, and plan were formulated together and I agree with the documentation in the resident's note.  Earl Lagos, MD

## 2023-09-14 LAB — HM DIABETES EYE EXAM

## 2023-09-23 ENCOUNTER — Ambulatory Visit (HOSPITAL_COMMUNITY)
Admission: RE | Admit: 2023-09-23 | Discharge: 2023-09-23 | Disposition: A | Discharge status: Home / Self Care | Payer: Medicaid Other | Source: Ambulatory Visit | Attending: Internal Medicine | Admitting: Internal Medicine

## 2023-09-23 DIAGNOSIS — M5431 Sciatica, right side: Secondary | ICD-10-CM | POA: Insufficient documentation

## 2023-09-23 DIAGNOSIS — M5432 Sciatica, left side: Secondary | ICD-10-CM | POA: Diagnosis present

## 2023-10-06 ENCOUNTER — Telehealth: Payer: Self-pay

## 2023-10-06 NOTE — Telephone Encounter (Signed)
I called pt - requesting MRI result; completed on 10/24. Also stated she had vaginal bleeding after having intercourse (stated she has not had intercourse in quite some time). She asked about GYN referral ; informed pt to wait until her appt on Friday and after being evaluated by the doctor.

## 2023-10-06 NOTE — Telephone Encounter (Signed)
Pt is requesting a call back ... She stated that she did complete her MRI but has not gotten a call with her results ... She also was worried about vag bleeding after intercourse .Marland Kitchen We did have one open with  Dr Justin Mend  on 11/8 @ 8:45  so I offered the appt and she took it

## 2023-10-08 ENCOUNTER — Ambulatory Visit (INDEPENDENT_AMBULATORY_CARE_PROVIDER_SITE_OTHER): Payer: Medicaid Other | Admitting: Student

## 2023-10-08 ENCOUNTER — Other Ambulatory Visit: Payer: Self-pay

## 2023-10-08 ENCOUNTER — Encounter: Payer: Self-pay | Admitting: Student

## 2023-10-08 ENCOUNTER — Other Ambulatory Visit (HOSPITAL_COMMUNITY)
Admission: RE | Admit: 2023-10-08 | Discharge: 2023-10-08 | Disposition: A | Discharge status: Home / Self Care | Payer: Medicaid Other | Source: Ambulatory Visit | Attending: Internal Medicine | Admitting: Internal Medicine

## 2023-10-08 VITALS — BP 130/61 | HR 61 | Temp 98.0°F | Ht 66.0 in | Wt 191.5 lb

## 2023-10-08 DIAGNOSIS — N939 Abnormal uterine and vaginal bleeding, unspecified: Secondary | ICD-10-CM

## 2023-10-08 DIAGNOSIS — M5432 Sciatica, left side: Secondary | ICD-10-CM

## 2023-10-08 DIAGNOSIS — M5431 Sciatica, right side: Secondary | ICD-10-CM | POA: Diagnosis not present

## 2023-10-08 DIAGNOSIS — E785 Hyperlipidemia, unspecified: Secondary | ICD-10-CM | POA: Diagnosis not present

## 2023-10-08 DIAGNOSIS — Z7984 Long term (current) use of oral hypoglycemic drugs: Secondary | ICD-10-CM

## 2023-10-08 DIAGNOSIS — E1169 Type 2 diabetes mellitus with other specified complication: Secondary | ICD-10-CM | POA: Diagnosis present

## 2023-10-08 LAB — GLUCOSE, CAPILLARY: Glucose-Capillary: 127 mg/dL — ABNORMAL HIGH (ref 70–99)

## 2023-10-08 MED ORDER — METHOCARBAMOL 750 MG PO TABS
750.0000 mg | ORAL_TABLET | Freq: Three times a day (TID) | ORAL | 0 refills | Status: DC | PRN
Start: 2023-10-08 — End: 2023-11-04

## 2023-10-08 MED ORDER — DULOXETINE HCL 30 MG PO CPEP: 30.0000 mg | ORAL_CAPSULE | Freq: Every day | ORAL | 0 refills | Status: DC

## 2023-10-08 NOTE — Patient Instructions (Addendum)
Thank you, [name] for allowing [place] to provide your care today. Today we discussed your back pain and vaginal bleeding.  I have ordered the following labs for you:  Lab Orders         CMP14 + Anion Gap         Urinalysis, Reflex Microscopic         CBC no Diff         HIV antibody (with reflex)         Hepatitis C Antibody         RPR      Tests ordered today:  - None  Referrals ordered today:   Referral Orders         Ambulatory referral to Neurosurgery         Ambulatory referral to Obstetrics / Gynecology       I have ordered the following medication/changed the following medications:   Stop the following medications: There are no discontinued medications.   Start the following medications: Meds ordered this encounter  Medications   methocarbamol (ROBAXIN-750) 750 MG tablet    Sig: Take 1 tablet (750 mg total) by mouth every 8 (eight) hours as needed for muscle spasms.    Dispense:  90 tablet    Refill:  0   DULoxetine (CYMBALTA) 30 MG capsule    Sig: Take 1 capsule (30 mg total) by mouth daily.    Dispense:  30 capsule    Refill:  0     Follow up: 1 month    Remember:   - I will call you with your lab results when they are back.   - You have been referred for neurosurgery as well as OBGYN, they will call you to set up those appointments. For OBGYN, the number for the [facility] is [phone]. Please call if you have not gotten a call from them by early next week.   - You are getting two medications for your back pain including Cymbalta 30 mg, please take at night before bedtime. The other medication is Robaxin and you can take one pill every 8 hours as needed. Please be aware that these medications may make you sleepy. STOP taking ibuprofen until we can confirm your kidney function. You can continue to take Tylenol as needed.   Should you have any questions or concerns please call the internal medicine clinic at [phone].      Tanya Sax Colbert Coyer, MD PGY-1 Internal Medicine Teaching [facility]

## 2023-10-08 NOTE — Progress Notes (Signed)
Established Patient Office Visit  Subjective   Patient ID: Tanya Austin, female    DOB: [birth year]  Age: 52 y.o. MRN: [id number]  Chief Complaint  Patient presents with   Vaginal Bleeding    Pt stated that after having intercourse she started to bleed     Patient is a 52 yo with a past medical history stated below who presents today for follow-up for bilateral sciatica and vaginal bleeding. Please see problem based assessment and plan for additional details.     Past Medical History:  Diagnosis Date   Diabetes mellitus    Dizziness 01/06/2022   Ear pain, right 06/24/2022   Fibroid    Headache    Herpes    High cholesterol    History of COVID-19 09/10/2020   Hx of gonorrhea 2017   Stomach pain 06/24/2022    Review of Systems  Constitutional:  Negative for chills and fever.  Gastrointestinal:  Negative for abdominal pain, nausea and vomiting.  Genitourinary:  Negative for dysuria.  Neurological:  Negative for weakness.     Objective:     BP 130/61 (BP Location: Left Arm, Patient Position: Sitting, Cuff Size: Normal)   Pulse 61   Temp 98 F (36.7 C) (Oral)   Ht 5\' 6"  (1.676 m)   Wt 191 lb 8 oz (86.9 kg)   SpO2 100%   BMI 30.91 kg/m  BP Readings from Last 3 Encounters:  10/08/23 130/61  08/17/23 128/68  07/28/23 129/78   Wt Readings from Last 3 Encounters:  10/08/23 191 lb 8 oz (86.9 kg)  08/17/23 193 lb (87.5 kg)  07/28/23 191 lb (86.6 kg)      Physical Exam HENT:     Head: Normocephalic and atraumatic.     Nose: Nose normal.     Mouth/Throat:     Mouth: Mucous membranes are moist.  Cardiovascular:     Rate and Rhythm: Normal rate and regular rhythm.     Pulses: Normal pulses.     Heart sounds: Normal heart sounds.  Pulmonary:     Effort: Pulmonary effort is normal.     Breath sounds: Normal breath sounds.  Abdominal:     General: Abdomen is flat. Bowel sounds are normal.     Palpations: Abdomen is soft.  Genitourinary:    Comments:  External vagina without any active lesions, injury, discharge, bleeding, edema, or erythema. Speculum exam: vaginal wall atrophy, no bleeding observed, cervix with color variation between pink/white/yellow, possible discharge observable, no gross abnormalities or masses. Pelvic exam with palpable changes in contour. No cervical motion tenderness or pain on palpation.   Musculoskeletal:     Comments: Positive straight leg raise on left and right side.   Skin:    General: Skin is warm and dry.  Neurological:     General: No focal deficit present.     Mental Status: She is alert.  Psychiatric:        Mood and Affect: Mood normal.        Behavior: Behavior normal.    The 10-year ASCVD risk score (Arnett DK, et al., 2019) is: 4.5%    Assessment & Plan:   Problem List Items Addressed This Visit     Type 2 diabetes mellitus with hyperlipidemia (HCC) - Primary    Patient's Hgb A1c 6.2% 9/17, CBG 127. Taking metformin 1000 mg daily with breakfast. Tolerating well. No complaints today. No changes to diabetes medication management today.  - Continue taking metformin 1000 mg  daily      Relevant Orders   CMP14 + Anion Gap (Completed)   Bilateral sciatica (Chronic)    Patient continues to experience low back pain with bilateral sciatica, consistent with physical exam today. She denies any red flag symptoms including weakness, saddle numbness, fever, and incontinence. Has been taking ibuprofen 800 mg and Tylenol as needed for pain with mild improvement. Pain is about the same from pain at last [facility] visit on 08/17/23.   Patient's MRI from from 10/24 without acute fracture, mass, or infection. Did show L3-L4 moderate spinal canal stenosis with moderate right and severe left subarticular zone stenosis and moderate left neural foraminal narrowing, moderate left subarticular zone stenosis at L5-S1 with posterior displacement of the traversing left S1 nerve root, and moderate left neural foraminal narrowing  at L4-5.  Discussed epidural steroid injections and referral to neurosurgery. Patient agreeable. Will add Cymbalta 30 mg daily and 30-day supply of Robaxin for pain management. Discussed need for CMP lab given use of ibuprofen and Tylenol. Patient agreeable to plan.  - Placed referral to neurosurgery for steroid injections - Start cymbalta 30 mg daily (at bedtime)  - Start robaxin 850 mg q8h as needed - Follow up CBC and CMP, adjust ibuprofen and Tylenol as needed - Follow up in clinic in 1 month        Relevant Medications   methocarbamol (ROBAXIN-750) 750 MG tablet   DULoxetine (CYMBALTA) 30 MG capsule   Other Relevant Orders   Ambulatory referral to Neurosurgery   Ambulatory referral to Obstetrics / Gynecology   Vaginal bleeding    Patient presents with complaint of post-coital vaginal bleed. Describes bleed as significant amount, similar to what she experiences during her menstrual periods. Patient with history of vaginal atrophy and bleeds. Previous biopsy of friable cervical mass was benign (per patient nothing to do, pathology not visible in results). Last pap smear 12/30/2020 with Dr. Vergie Living found to have ASC-US, recommended repeating in 3 years, due 12/11/2023. Uterine fibroids visible on MRI from 09/23/23. Discussed differential with patient including mass vs fibroids vs vaginal atrophy. Patient agreeable to speculum exam today. No visible cervical masses, but there appeared to be some color variegation on cervix. No visible bleeding or discharge on exam. Pelvic exam positive for countour changes that are likely consistent with fibroids. Collected self-swab to help determine if there is infection present, as well as CBC, HIV/RPR/Hep C labs. Will refer to OBGYN for further evaluation and management.  - Placed referral to OBGYN, patient to call number provided on AVS if no one calls by next week  -Follow up on CBC, HIV, RPR, Hep C, cervicovaginal swab results as needed      Relevant  Orders   Urinalysis, Reflex Microscopic (Completed)   Cervicovaginal ancillary only   CBC no Diff (Completed)   HIV antibody (with reflex) (Completed)   Hepatitis C Antibody (Completed)   RPR (Completed)    Return in about 4 weeks (around 11/05/2023) for Vaginal bleeding, back pain .  Patient seen with Dr. Lafonda Mosses.  Jamia Hoban Colbert Coyer, MD

## 2023-10-09 DIAGNOSIS — N939 Abnormal uterine and vaginal bleeding, unspecified: Secondary | ICD-10-CM | POA: Insufficient documentation

## 2023-10-09 LAB — URINALYSIS, ROUTINE W REFLEX MICROSCOPIC
Bilirubin, UA: NEGATIVE
Glucose, UA: NEGATIVE
Ketones, UA: NEGATIVE
Leukocytes,UA: NEGATIVE
Nitrite, UA: NEGATIVE
Protein,UA: NEGATIVE
RBC, UA: NEGATIVE
Specific Gravity, UA: 1.021 (ref 1.005–1.030)
Urobilinogen, Ur: 0.2 mg/dL (ref 0.2–1.0)
pH, UA: 7 (ref 5.0–7.5)

## 2023-10-09 NOTE — Assessment & Plan Note (Signed)
Patient presents with complaint of post-coital vaginal bleed. Describes bleed as significant amount, similar to what she experiences during her menstrual periods. Patient with history of vaginal atrophy and bleeds. Previous biopsy of friable cervical mass was benign (per patient nothing to do, pathology not visible in results). Last pap smear 12/30/2020 with Dr. Vergie Living found to have ASC-US, recommended repeating in 3 years, due 12/11/2023. Uterine fibroids visible on MRI from 09/23/23. Discussed differential with patient including mass vs fibroids vs vaginal atrophy. Patient agreeable to speculum exam today. No visible cervical masses, but there appeared to be some color variegation on cervix. No visible bleeding or discharge on exam. Pelvic exam positive for countour changes that are likely consistent with fibroids. Collected self-swab to help determine if there is infection present, as well as CBC, HIV/RPR/Hep C labs. Will refer to OBGYN for further evaluation and management.  - Placed referral to OBGYN, patient to call number provided on AVS if no one calls by next week  -Follow up on CBC, HIV, RPR, Hep C, cervicovaginal swab results as needed

## 2023-10-09 NOTE — Assessment & Plan Note (Signed)
Patient continues to experience low back pain with bilateral sciatica, consistent with physical exam today. She denies any red flag symptoms including weakness, saddle numbness, fever, and incontinence. Has been taking ibuprofen 800 mg and Tylenol as needed for pain with mild improvement. Pain is about the same from pain at last [facility] visit on 08/17/23.   Patient's MRI from from 10/24 without acute fracture, mass, or infection. Did show L3-L4 moderate spinal canal stenosis with moderate right and severe left subarticular zone stenosis and moderate left neural foraminal narrowing, moderate left subarticular zone stenosis at L5-S1 with posterior displacement of the traversing left S1 nerve root, and moderate left neural foraminal narrowing at L4-5.  Discussed epidural steroid injections and referral to neurosurgery. Patient agreeable. Will add Cymbalta 30 mg daily and 30-day supply of Robaxin for pain management. Discussed need for CMP lab given use of ibuprofen and Tylenol. Patient agreeable to plan.  - Placed referral to neurosurgery for steroid injections - Start cymbalta 30 mg daily (at bedtime)  - Start robaxin 850 mg q8h as needed - Follow up CBC and CMP, adjust ibuprofen and Tylenol as needed - Follow up in clinic in 1 month

## 2023-10-09 NOTE — Assessment & Plan Note (Signed)
Patient's Hgb A1c 6.2% 9/17, CBG 127. Taking metformin 1000 mg daily with breakfast. Tolerating well. No complaints today. No changes to diabetes medication management today.  - Continue taking metformin 1000 mg daily

## 2023-10-11 ENCOUNTER — Other Ambulatory Visit: Payer: Self-pay | Admitting: Student

## 2023-10-11 LAB — CERVICOVAGINAL ANCILLARY ONLY
Bacterial Vaginitis (gardnerella): POSITIVE — AB
Candida Glabrata: NEGATIVE
Candida Vaginitis: NEGATIVE
Chlamydia: NEGATIVE
Comment: NEGATIVE
Comment: NEGATIVE
Comment: NEGATIVE
Comment: NEGATIVE
Comment: NEGATIVE
Comment: NORMAL
Neisseria Gonorrhea: NEGATIVE
Trichomonas: NEGATIVE

## 2023-10-11 LAB — CMP14 + ANION GAP
ALT: 15 IU/L (ref 0–32)
AST: 14 IU/L (ref 0–40)
Albumin: 4.1 g/dL (ref 3.9–4.9)
Alkaline Phosphatase: 67 IU/L (ref 44–121)
Anion Gap: 14 mmol/L (ref 10.0–18.0)
BUN/Creatinine Ratio: 17 (ref 9–23)
BUN: 13 mg/dL (ref 6–24)
Bilirubin Total: 0.4 mg/dL (ref 0.0–1.2)
CO2: 20 mmol/L (ref 20–29)
Calcium: 9.8 mg/dL (ref 8.7–10.2)
Chloride: 105 mmol/L (ref 96–106)
Creatinine, Ser: 0.75 mg/dL (ref 0.57–1.00)
Globulin, Total: 2.4 g/dL (ref 1.5–4.5)
Glucose: 112 mg/dL — ABNORMAL HIGH (ref 70–99)
Potassium: 5 mmol/L (ref 3.5–5.2)
Sodium: 139 mmol/L (ref 134–144)
Total Protein: 6.5 g/dL (ref 6.0–8.5)
eGFR: 97 mL/min/{1.73_m2} (ref >59)

## 2023-10-11 LAB — CBC
Hematocrit: 38 % (ref 34.0–46.6)
Hemoglobin: 12.3 g/dL (ref 11.1–15.9)
MCH: 30 pg (ref 26.6–33.0)
MCHC: 32.4 g/dL (ref 31.5–35.7)
MCV: 93 fL (ref 79–97)
Platelets: 221 10*3/uL (ref 150–450)
RBC: 4.1 x10E6/uL (ref 3.77–5.28)
RDW: 12.1 % (ref 11.7–15.4)
WBC: 4.5 10*3/uL (ref 3.4–10.8)

## 2023-10-11 LAB — HEPATITIS C ANTIBODY: Hep C Virus Ab: NONREACTIVE

## 2023-10-11 LAB — HIV ANTIBODY (ROUTINE TESTING W REFLEX): HIV Screen 4th Generation wRfx: NONREACTIVE

## 2023-10-11 LAB — RPR: RPR Ser Ql: NONREACTIVE

## 2023-10-11 MED ORDER — METRONIDAZOLE 500 MG PO TABS: 500.0000 mg | ORAL_TABLET | Freq: Two times a day (BID) | ORAL | 0 refills | Status: AC

## 2023-10-12 ENCOUNTER — Other Ambulatory Visit: Payer: Self-pay | Admitting: Internal Medicine

## 2023-10-12 NOTE — Progress Notes (Signed)
Internal Medicine Clinic Attending  I was physically present during the key portions of the resident provided service and participated in the medical decision making of patient's management care. I reviewed pertinent patient test results.  The assessment, diagnosis, and plan were formulated together and I agree with the documentation in the resident's note.  Mercie Eon, MD    Patient is interested in epidural steroid injections for her pain 2/2 moderate lumbar stenosis, so we have referred her to Neurosurgery for these.   I was present for patient's pelvic exam today - I agree with OB/GYN referral for vaginal bleeding and with tx for BV

## 2023-10-15 ENCOUNTER — Other Ambulatory Visit: Payer: Self-pay | Admitting: Student

## 2023-10-15 ENCOUNTER — Telehealth: Payer: Self-pay | Admitting: Student

## 2023-10-15 DIAGNOSIS — N939 Abnormal uterine and vaginal bleeding, unspecified: Secondary | ICD-10-CM

## 2023-10-15 NOTE — Telephone Encounter (Signed)
Rec'd a call from the pt she is unable to sch an appt with [facility] until Jan.  Pt Dx Code for the OBGYN referral is also for the following:  Please let me know if this is the correct dx below for this patient.  Service Details Procedure Modifiers Provider Requested Approved  [id number] - AMB REFERRAL TO OB-GYN none  1 1   Diagnosis Information  Diagnosis  M54.31,M54.32 (ICD-10-CM) - Bilateral sciatica   Pt is also requesting a call back about her vaginal bleeding.

## 2023-10-20 ENCOUNTER — Telehealth: Payer: Self-pay | Admitting: *Deleted

## 2023-10-20 DIAGNOSIS — B86 Scabies: Secondary | ICD-10-CM

## 2023-10-20 NOTE — Telephone Encounter (Signed)
Call from patient had Scabies a few months ago. Which was treated.  Recently used some hair grease that she was using at the same time.  Wants to know if she could have reinfected herself.  Head is itching as before but now has Twists that could also cause her head to itch.

## 2023-10-21 ENCOUNTER — Encounter: Payer: Self-pay | Admitting: Student

## 2023-10-21 ENCOUNTER — Ambulatory Visit: Payer: Medicaid Other | Admitting: Student

## 2023-10-21 VITALS — BP 136/74 | HR 77 | Temp 98.2°F | Ht 66.0 in | Wt 197.5 lb

## 2023-10-21 DIAGNOSIS — L409 Psoriasis, unspecified: Secondary | ICD-10-CM | POA: Diagnosis not present

## 2023-10-21 DIAGNOSIS — G629 Polyneuropathy, unspecified: Secondary | ICD-10-CM

## 2023-10-21 DIAGNOSIS — E119 Type 2 diabetes mellitus without complications: Secondary | ICD-10-CM

## 2023-10-21 MED ORDER — PERMETHRIN 5 % EX CREA
1.0000 | TOPICAL_CREAM | Freq: Once | CUTANEOUS | 1 refills | Status: AC
Start: 2023-10-21 — End: 2023-10-21

## 2023-10-21 MED ORDER — CETIRIZINE HCL 10 MG PO TABS: 10.0000 mg | ORAL_TABLET | Freq: Every day | ORAL | 0 refills | Status: DC

## 2023-10-21 MED ORDER — GABAPENTIN 100 MG PO CAPS: 200.0000 mg | ORAL_CAPSULE | Freq: Every day | ORAL | 0 refills | Status: DC

## 2023-10-21 NOTE — Telephone Encounter (Signed)
RTC from patient.  C/O  fingers burning, and aching.  Head is still burning as well. Was told to come in for an appointment.  No available appointments today. Spoke wit Dr. Ninfa Meeker will call in prescription for a creme.  Patient was notified wants medication sent to the [facility] on [address].

## 2023-10-21 NOTE — Progress Notes (Addendum)
CC: Concerns for scabies  HPI:  [name] is a 52 y.o. female living with a history stated below and presents today for concerns for scabies and also discuss her other chronic conditions. Please see problem based assessment and plan for additional details.  Past Medical History:  Diagnosis Date   Diabetes mellitus    Dizziness 01/06/2022   Ear pain, right 06/24/2022   Fibroid    Headache    Herpes    High cholesterol    History of COVID-19 09/10/2020   Hx of gonorrhea 2017   Stomach pain 06/24/2022    Current Outpatient Medications on File Prior to Visit  Medication Sig Dispense Refill   acetaminophen (TYLENOL) 500 MG tablet Take 1,000 mg by mouth 2 (two) times daily as needed for moderate pain.     cetirizine (ZYRTEC ALLERGY) 10 MG tablet Take 1 tablet (10 mg total) by mouth daily. For itching. 15 tablet 0   Cholecalciferol (VITAMIN D3) 3000 units TABS Take 1 capsule by mouth daily.      conjugated estrogens (PREMARIN) vaginal cream Place 1 Applicatorful vaginally daily. 42.5 g 12   DULoxetine (CYMBALTA) 30 MG capsule Take 1 capsule (30 mg total) by mouth daily. 30 capsule 0   fish oil-omega-3 fatty acids 1000 MG capsule Take 1 g by mouth daily.     fluticasone (FLONASE) 50 MCG/ACT nasal spray SHAKE LIQUID AND USE 1 SPRAY IN EACH NOSTRIL IN THE MORNING AND AT BEDTIME 48 g 0   metFORMIN (GLUCOPHAGE) 1000 MG tablet Take 1 tablet (1,000 mg total) by mouth daily with breakfast. 30 tablet 10   methocarbamol (ROBAXIN-750) 750 MG tablet Take 1 tablet (750 mg total) by mouth every 8 (eight) hours as needed for muscle spasms. 90 tablet 0   naproxen (NAPROSYN) 375 MG tablet Take 1 tablet (375 mg total) by mouth 2 (two) times daily with a meal. 14 tablet 0   permethrin (ELIMITE) 5 % cream Apply 1 Application topically once for 1 dose. Apply thin layer to full body below neck according to package instructions, including beneath nails. Leave on for 8-12 hours then wash. Wash all  bedding and clothing on high heat. 60 g 1   predniSONE (DELTASONE) 20 MG tablet Take 2 tablets (40 mg total) by mouth daily. 10 tablet 0   psyllium (METAMUCIL SMOOTH TEXTURE) 58.6 % powder Take 1 packet by mouth daily.     RESTASIS 0.05 % ophthalmic emulsion 1 drop 2 (two) times daily.     simvastatin (ZOCOR) 20 MG tablet TAKE 1 TABLET BY MOUTH AT BEDTIME 180 tablet 1   valACYclovir (VALTREX) 500 MG tablet Take 1 tablet daily with no outbreak. Take 1 tablet BID x 3 days with an outbreak 90 tablet 3   No current facility-administered medications on file prior to visit.    Family History  Problem Relation Age of Onset   Hypertension Mother    Diabetes Mother    Hypertension Father    Hypertension Other    Hyperlipidemia Other    Heart disease Other     Social History   Socioeconomic History   Marital status: Single    Spouse name: Not on file   Number of children: Not on file   Years of education: Not on file   Highest education level: Not on file  Occupational History   Not on file  Tobacco Use   Smoking status: Former    Current packs/day: 0.00    Types: Cigarettes  Quit date: 06/09/2000    Years since quitting: 23.3   Smokeless tobacco: Never  Vaping Use   Vaping status: Never Used  Substance and Sexual Activity   Alcohol use: No   Drug use: No    Types: Marijuana   Sexual activity: Yes    Birth control/protection: None    Comment: Trinessa  Other Topics Concern   Not on file  Social History Narrative   Not on file   Social Determinants of Health   Financial Resource Strain: Not on file  Food Insecurity: Not on file  Transportation Needs: Not on file  Physical Activity: Not on file  Stress: Not on file  Social Connections: Not on file  Intimate Partner Violence: Not on file    Review of Systems: ROS negative except for what is noted on the assessment and plan.  There were no vitals filed for this visit.  Physical Exam: Constitutional:  Well-appearing woman sitting in chair in no acute distress  Cardiovascular: regular rate and rhythm, no m/r/g Pulmonary/Chest: normal work of breathing on room air, lungs clear to auscultation bilaterally Abdominal: soft, non-tender, non-distended MSK: normal bulk and tone Neurological: alert & oriented x 3, no focal deficit Skin: warm and dry  Psych: normal mood and behavior  Assessment & Plan:   No problem-specific Assessment & Plan notes found for this encounter.  Concern for scabies Patient has a history of scabies, was prescribed Permethrin, says the itching is getting better.  She is now  reporting a  burning sensation in both lower and upper extremities, however denies any tingling sensations.  Patient is prediabetic and her presenting symptoms is concerning for peripheral neuropathy.  On exam, I do not see anything concerning for scabies at this time, her finger webs are without any burrow marks. - Reassure  Peripheral neuropathy Patient reports burning sensation in her hands and her feet.  Reports this has  been going on for the past few days. Says it is so severe that it affects her daily life in terms of going to the grocery store or getting out of the house.  Patient is prediabetic and has a controlled hemoglobin A1c of 6.2, on metformin .  I suspect this peripheral neuropathy could be due to her elevated blood sugars. Will start the patient on gabapentin  and reassess her symptoms at the next visit. -  Start 200 mg gabapentin every night  Psoriasis History of psoriasis, reports concerns of ongoing itch and reddened scalp. -Ambulatory referral to dermatologist  Patient seen with Dr. Rip Harbour, M.D [facility] Phone: [phone] Date 10/21/2023 Time 10:26 AM

## 2023-10-21 NOTE — Telephone Encounter (Signed)
RTC from patient would like to be seen by a doctor before getting the creme .Marland Kitchen Given an 10:15 AM appointment for today.

## 2023-10-21 NOTE — Patient Instructions (Signed)
Thank you, [name] for allowing [place] to provide your care today. Today we discussed discussed your general health and your concerns for scabies. -Thank you burning sensations due to peripheral neuropathy -I sent to gabapentin to your pharmacy - Please take 200 mg at night before sleep because it can make you drowsy -Call us back and let us know how you do on this medication  I have ordered the following labs for you:  Lab Orders  No laboratory test(s) ordered today     Tests ordered today:   Referrals ordered today:   Referral Orders         Ambulatory referral to Dermatology      I have ordered the following medication/changed the following medications:   Stop the following medications: There are no discontinued medications.   Start the following medications: Meds ordered this encounter  Medications   gabapentin (NEURONTIN) 100 MG capsule    Sig: Take 2 capsules (200 mg total) by mouth at bedtime.    Dispense:  60 capsule    Refill:  0     Follow up: 4 months   Remember:   Should you have any questions or concerns please call the internal medicine clinic at [phone].    Kathleen Lime, M.D [facility]

## 2023-10-26 NOTE — Progress Notes (Signed)
Internal Medicine Clinic Attending  I saw and evaluated the patient.  I personally confirmed the key portions of the history and exam documented by Dr.  Mickie Bail  and I reviewed pertinent patient test results.  The assessment, diagnosis, and plan were formulated together and I agree with the documentation in the resident's note with the following correction:  Patient has a diagnosis of diabetes, NOT prediabetes as mentioned in the note. A1c has been at goal <7% for some time. Symptoms concerning for distal, symmetric neuropathy 2/2 diabetes in fingers and toes. No clinical evidence of scabies noted on today's examination.

## 2023-11-04 ENCOUNTER — Other Ambulatory Visit: Payer: Self-pay | Admitting: Student

## 2023-11-04 DIAGNOSIS — M5431 Sciatica, right side: Secondary | ICD-10-CM

## 2023-11-04 NOTE — Telephone Encounter (Signed)
methocarbamol (ROBAXIN-750) 750 MG tablet   [id number] - [place], [place] - [address] AT [facility] OF [address] & [address]

## 2023-11-04 NOTE — Telephone Encounter (Signed)
Next appt scheduled 12/12 with Dr Ninfa Meeker.

## 2023-11-05 MED ORDER — METHOCARBAMOL 750 MG PO TABS: 750.0000 mg | ORAL_TABLET | Freq: Three times a day (TID) | ORAL | 0 refills | Status: DC | PRN

## 2023-11-07 ENCOUNTER — Other Ambulatory Visit: Payer: Self-pay | Admitting: Student

## 2023-11-07 DIAGNOSIS — M5431 Sciatica, right side: Secondary | ICD-10-CM

## 2023-11-08 ENCOUNTER — Other Ambulatory Visit: Payer: Self-pay

## 2023-11-08 ENCOUNTER — Ambulatory Visit: Payer: Medicaid Other | Admitting: Obstetrics & Gynecology

## 2023-11-08 ENCOUNTER — Encounter: Payer: Medicaid Other | Admitting: Student

## 2023-11-08 ENCOUNTER — Encounter: Payer: Self-pay | Admitting: Obstetrics & Gynecology

## 2023-11-08 ENCOUNTER — Other Ambulatory Visit (HOSPITAL_COMMUNITY)
Admission: RE | Admit: 2023-11-08 | Discharge: 2023-11-08 | Disposition: A | Discharge status: Home / Self Care | Payer: Medicaid Other | Source: Ambulatory Visit | Attending: Obstetrics & Gynecology | Admitting: Obstetrics & Gynecology

## 2023-11-08 VITALS — BP 123/73 | HR 73 | Wt 194.0 lb

## 2023-11-08 DIAGNOSIS — N898 Other specified noninflammatory disorders of vagina: Secondary | ICD-10-CM

## 2023-11-08 DIAGNOSIS — Z1331 Encounter for screening for depression: Secondary | ICD-10-CM | POA: Diagnosis not present

## 2023-11-08 DIAGNOSIS — B009 Herpesviral infection, unspecified: Secondary | ICD-10-CM | POA: Diagnosis not present

## 2023-11-08 DIAGNOSIS — N939 Abnormal uterine and vaginal bleeding, unspecified: Secondary | ICD-10-CM | POA: Diagnosis not present

## 2023-11-08 DIAGNOSIS — E1169 Type 2 diabetes mellitus with other specified complication: Secondary | ICD-10-CM | POA: Diagnosis not present

## 2023-11-08 DIAGNOSIS — Z6831 Body mass index (BMI) 31.0-31.9, adult: Secondary | ICD-10-CM

## 2023-11-08 DIAGNOSIS — E66811 Obesity, class 1: Secondary | ICD-10-CM

## 2023-11-08 DIAGNOSIS — N952 Postmenopausal atrophic vaginitis: Secondary | ICD-10-CM

## 2023-11-08 DIAGNOSIS — E785 Hyperlipidemia, unspecified: Secondary | ICD-10-CM

## 2023-11-08 DIAGNOSIS — E6609 Other obesity due to excess calories: Secondary | ICD-10-CM

## 2023-11-08 LAB — POCT URINALYSIS DIP (DEVICE)
Bilirubin Urine: NEGATIVE
Glucose, UA: NEGATIVE mg/dL
Hgb urine dipstick: NEGATIVE
Ketones, ur: NEGATIVE mg/dL
Leukocytes,Ua: NEGATIVE
Nitrite: NEGATIVE
Protein, ur: NEGATIVE mg/dL
Specific Gravity, Urine: >=1.03 (ref 1.005–1.030)
Urobilinogen, UA: 1 mg/dL (ref 0.0–1.0)
pH: 5.5 (ref 5.0–8.0)

## 2023-11-08 MED ORDER — VALACYCLOVIR HCL 500 MG PO TABS: ORAL_TABLET | ORAL | 3 refills | Status: DC

## 2023-11-08 NOTE — Progress Notes (Signed)
GYNECOLOGY OFFICE VISIT NOTE  History:   Tanya Austin is a 52 y.o. [phone] here today for vaginal burning and bleeding after intercourse. She states she has felt a burning sensation in her vagina that is worse with urination and intercourse. She also recently experienced bleeding after intercourse that lasted a few days. She says that her periods are regular and heavy as she has multiple clots during them.   Health Maintenance Due  Topic Date Due   COVID-19 Vaccine (1) Never done   Zoster Vaccines- Shingrix (1 of 2) Never done   INFLUENZA VACCINE  Never done   OPHTHALMOLOGY EXAM  09/09/2023    Past Medical History:  Diagnosis Date   Diabetes mellitus    Dizziness 01/06/2022   Ear pain, right 06/24/2022   Fibroid    Headache    Herpes    High cholesterol    History of COVID-19 09/10/2020   Hx of gonorrhea 2017   Stomach pain 06/24/2022    Past Surgical History:  Procedure Laterality Date   CERVICAL BIOPSY      The following portions of the patient's history were reviewed and updated as appropriate: allergies, current medications, past family history, past medical history, past social history, past surgical history and problem list.   Health Maintenance:   Last pap: Lab Results  Component Value Date   DIAGPAP (A) 12/30/2020    - Atypical squamous cells of undetermined significance (ASC-US)   HPVHIGH Negative 12/30/2020    Last mammogram:  03/24/23; normal findings   Review of Systems:  Pertinent items noted in HPI and remainder of comprehensive ROS otherwise negative.  Physical Exam:  BP 123/73   Pulse 73   Wt 194 lb (88 kg)   LMP 10/18/2023 (Approximate) Comment: normally last 5-7 days  BMI 31.31 kg/m  CONSTITUTIONAL: Well-developed, well-nourished female in no acute distress.  HEENT:  Normocephalic, atraumatic. External right and left ear normal. No scleral icterus.  NECK: Normal range of motion, supple, no masses noted on observation SKIN: No rash  noted. Not diaphoretic. No erythema. No pallor. MUSCULOSKELETAL: Normal range of motion. No edema noted. NEUROLOGIC: Alert and oriented to person, place, and time. Normal muscle tone coordination.  PSYCHIATRIC: Normal mood and affect. Normal behavior. Normal judgment and thought content. RESPIRATORY: Effort normal, no problems with respiration noted ABDOMEN: No masses noted. No other overt distention noted.   PELVIC: Deferred  Labs and Imaging No results found for this or any previous visit (from the past 168 hour(s)). No results found.    Assessment and Plan:  - UA, self swabs collected at today's visit to see if there is a source for the vaginal burning and bleeding after intercourse.   Problem List Items Addressed This Visit       Endocrine   Type 2 diabetes mellitus with hyperlipidemia (HCC)     Genitourinary   Vaginal atrophy     Other   Obesity due to excess calories with serious comorbidity (Chronic)   Vaginal bleeding - Primary    Routine preventative health maintenance measures emphasized. Please refer to After Visit Summary for other counseling recommendations.   No follow-ups on file.    Total face-to-face time with patient: 15 minutes.  Over 50% of encounter was spent on counseling and coordination of care.   MadisonCaitlin M Pepper, Student-PA  Attestation of Attending Supervision of PA Student: Evaluation and management procedures were performed by the PA student under my supervision and collaboration.  I have reviewed  the student's note and chart, and I agree with the management and plan.  Scheryl Darter, MD, FACOG Attending Obstetrician & Gynecologist Faculty Practice, [facility]

## 2023-11-09 LAB — CERVICOVAGINAL ANCILLARY ONLY
Bacterial Vaginitis (gardnerella): NEGATIVE
Candida Glabrata: NEGATIVE
Candida Vaginitis: NEGATIVE
Chlamydia: NEGATIVE
Comment: NEGATIVE
Comment: NEGATIVE
Comment: NEGATIVE
Comment: NEGATIVE
Comment: NEGATIVE
Comment: NORMAL
Neisseria Gonorrhea: NEGATIVE
Trichomonas: NEGATIVE

## 2023-11-11 ENCOUNTER — Ambulatory Visit: Payer: Medicaid Other | Admitting: Student

## 2023-11-11 VITALS — HR 79 | Temp 98.1°F | Ht 67.0 in | Wt 198.3 lb

## 2023-11-11 DIAGNOSIS — R1310 Dysphagia, unspecified: Secondary | ICD-10-CM | POA: Diagnosis not present

## 2023-11-11 DIAGNOSIS — M5432 Sciatica, left side: Secondary | ICD-10-CM | POA: Diagnosis not present

## 2023-11-11 DIAGNOSIS — M5431 Sciatica, right side: Secondary | ICD-10-CM

## 2023-11-11 DIAGNOSIS — E1142 Type 2 diabetes mellitus with diabetic polyneuropathy: Secondary | ICD-10-CM | POA: Diagnosis not present

## 2023-11-11 DIAGNOSIS — N939 Abnormal uterine and vaginal bleeding, unspecified: Secondary | ICD-10-CM

## 2023-11-11 DIAGNOSIS — B009 Herpesviral infection, unspecified: Secondary | ICD-10-CM

## 2023-11-11 DIAGNOSIS — J019 Acute sinusitis, unspecified: Secondary | ICD-10-CM

## 2023-11-11 DIAGNOSIS — L308 Other specified dermatitis: Secondary | ICD-10-CM | POA: Diagnosis not present

## 2023-11-11 DIAGNOSIS — E1140 Type 2 diabetes mellitus with diabetic neuropathy, unspecified: Secondary | ICD-10-CM | POA: Insufficient documentation

## 2023-11-11 DIAGNOSIS — R1319 Other dysphagia: Secondary | ICD-10-CM

## 2023-11-11 MED ORDER — FLUTICASONE PROPIONATE 50 MCG/ACT NA SUSP: 1.0000 | Freq: Two times a day (BID) | NASAL | 0 refills | Status: DC | PRN

## 2023-11-11 MED ORDER — METHOCARBAMOL 750 MG PO TABS: 750.0000 mg | ORAL_TABLET | Freq: Three times a day (TID) | ORAL | 2 refills | Status: DC | PRN

## 2023-11-11 MED ORDER — GABAPENTIN 100 MG PO CAPS: 200.0000 mg | ORAL_CAPSULE | Freq: Every day | ORAL | 3 refills | Status: DC

## 2023-11-11 MED ORDER — VALACYCLOVIR HCL 500 MG PO TABS: ORAL_TABLET | ORAL | 3 refills | Status: DC

## 2023-11-11 NOTE — Assessment & Plan Note (Addendum)
1 month follow-up for this issue.  She reports menses-like bleeding primarily after intercourse, approximately stable over the last month.  At that time a exam suggested some discoloration, possible fibroids.  Infective workup negative.  Not anemic.  She has since established with gynecology.  She will call them to continue her work with them.  She is welcome to discuss this with [place] if any further concerns. -Continue with gynecology

## 2023-11-11 NOTE — Assessment & Plan Note (Signed)
Itching at the head, she reports a history of psoriasis previously worked with a Armed forces operational officer in [place], however she has since moved, she would like to reestablish somewhere close by. - Refer to dermatology

## 2023-11-11 NOTE — Assessment & Plan Note (Signed)
Dysphagia worked up previously in this office.  GI referral was placed, but the patient did not receive it. - Another GI referral placed today

## 2023-11-11 NOTE — Assessment & Plan Note (Addendum)
1 month follow-up for recent onset neuropathic burning pain in the hands.  She has a longstanding history of diabetes, currently takes metformin.  She does have a history of scabies as well, although does not appear to have a scabies problem at this time.  Her pain is responded to gabapentin, medicine was started 1 month ago.  Recent CBC unremarkable, doubt vitamin deficiency, not on any medicines that I believe are causing neuropathic signs, likely cause of her neuropathy is diabetes. - Continue gabapentin 200 at bedtime

## 2023-11-11 NOTE — Assessment & Plan Note (Addendum)
Refill methocarbamol 750 every 8 hours as needed and gabapentin 200 at bedtime.  She has an upcoming appointment for an injection with an orthopedist.

## 2023-11-11 NOTE — Progress Notes (Signed)
   CC: Follow up neuropathy and vaginal bleeding  HPI:  [name] is a 51 y.o. female living with a history stated below and presents today for follow up. Please see problem based assessment and plan for additional details.  Past Medical History:  Diagnosis Date   Diabetes mellitus    Dizziness 01/06/2022   Ear pain, right 06/24/2022   Fibroid    Headache    Herpes    High cholesterol    History of COVID-19 09/10/2020   Hx of gonorrhea 2017   Stomach pain 06/24/2022    Review of Systems: ROS negative except for what is noted on the assessment and plan.  Vitals:   11/11/23 0952  Pulse: 79  Temp: 98.1 F (36.7 C)  TempSrc: Oral  Weight: 198 lb 4.8 oz (89.9 kg)  Height: 5\' 7"  (1.702 m)   Physical Exam: Constitutional: well-appearing woman sitting in chair, in no acute distress HENT: normocephalic atraumatic, mucous membranes moist Eyes: conjunctiva non-erythematous Cardiovascular: regular rate and rhythm, no m/r/g Pulmonary/Chest: normal work of breathing on room air, lungs clear to auscultation bilaterally MSK: normal bulk and tone Neurological: alert & oriented x 3, no focal deficit Skin: warm and dry Psych: normal mood and behavior  Assessment & Plan:   Patient discussed with Dr. Antony Contras  Diabetic neuropathy (HCC) 1 month follow-up for recent onset neuropathic burning pain in the hands.  She has a longstanding history of diabetes, currently takes metformin.  She does have a history of scabies as well, although does not appear to have a scabies problem at this time.  Her pain is responded to gabapentin, medicine was started 1 month ago.  Recent CBC unremarkable, doubt vitamin deficiency, not on any medicines that I believe are causing neuropathic signs, likely cause of her neuropathy is diabetes. - Continue gabapentin 200 at bedtime  Bilateral sciatica Refill methocarbamol 750 every 8 hours as needed and gabapentin 200 at bedtime.  She has an upcoming  appointment for an injection with an orthopedist.  Vaginal bleeding 1 month follow-up for this issue.  She reports menses-like bleeding primarily after intercourse, approximately stable over the last month.  At that time a exam suggested some discoloration, possible fibroids.  Infective workup negative.  Not anemic.  She has since established with gynecology.  She will call them to continue her work with them.  She is welcome to discuss this with [place] if any further concerns. -Continue with gynecology  Pruritic dermatitis Itching at the head, she reports a history of psoriasis previously worked with a Armed forces operational officer in [place], however she has since moved, she would like to reestablish somewhere close by. - Refer to dermatology  Esophageal dysphagia Dysphagia worked up previously in this office.  GI referral was placed, but the patient did not receive it. - Another GI referral placed today  Katheran James, D.O. [facility] Internal Medicine, PGY-1 Phone: [phone] Date 11/11/2023 Time 8:35 PM

## 2023-11-16 ENCOUNTER — Telehealth: Payer: Self-pay | Admitting: Family Medicine

## 2023-11-16 NOTE — Telephone Encounter (Signed)
Patient came in for appt and did not feel that her issue was resolved by the physician, would like a nurse to call her back to see what she is needing to do

## 2023-11-16 NOTE — Progress Notes (Signed)
Internal Medicine Clinic Attending  Case discussed with the resident at the time of the visit.  We reviewed the resident's history and exam and pertinent patient test results.  I agree with the assessment, diagnosis, and plan of care documented in the resident's note.  

## 2023-11-17 ENCOUNTER — Other Ambulatory Visit: Payer: Self-pay | Admitting: Student

## 2023-11-17 DIAGNOSIS — M5431 Sciatica, right side: Secondary | ICD-10-CM

## 2023-11-17 NOTE — Telephone Encounter (Signed)
Patient returned call at 1540. She stated that she had come in for an office visit regarding concerns of vaginal bleeding and burning with urination. She states that she had had testing done and that everything was negative, but was not given any further explanation or next steps moving forward.   Upon chart review, RN found that patient had come in for visit with Marcie Bal MD on 11/08/23 and had done a CV swab and POCT UA. All results came back negative and normal.   Patient is still experiencing vaginal bleeding, burning with urination, and vaginal pain.   Informed patient that I could forward all of this information to another provider in the office to consult on and, when we get next steps, we could reach out and let her know what the plan is. Patient verbalized understanding, agreed to this plan, and requested that we contact her via MyChart message instead of phone call.   Message/note being forwarded to Cathren Harsh MD.   Maureen Ralphs RN on 11/17/23 at [phone]

## 2023-11-17 NOTE — Telephone Encounter (Signed)
Attempted to call patient at number listed in chart. Continued to ring until automated VM picked up and stated to try call again later--unable to leave message.   Maureen Ralphs RN on 11/17/23 at [phone]

## 2023-11-19 NOTE — Telephone Encounter (Signed)
Called and spoke with patient.   Patient reports her bleeding has increased. Cycle lasts 5-7 days, is regular, however has had 1 episode of 2 periods in a month, this year. She reports she has a lot of pain in her vagina in general, she describes it as a shooting pain regularly, with no instigating circumstances. She also experiences light spotting a few times this year.   She reports she changes her pad every 1-2 hours in the first day or 2 of her cycle then tapers off. She is passing clots throughout the cycle at least the size of a quarter. The clots are a new issue.   She is having burning all day and also with urination.   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 She is taking Valtrex daily and symptoms have not improved.   She reports she started menopause in late 30's/early 70's. She reports she used the Estrogen cream, earlier this year and she reports she was told to stop using it. She is afraid to use as she is concerned.   Will route to Dr. Briscoe Deutscher for recommendations.

## 2023-12-11 ENCOUNTER — Other Ambulatory Visit: Payer: Self-pay | Admitting: Internal Medicine

## 2023-12-15 ENCOUNTER — Encounter: Payer: Self-pay | Admitting: Student

## 2024-01-03 ENCOUNTER — Ambulatory Visit: Payer: Medicaid Other | Admitting: Obstetrics and Gynecology

## 2024-01-03 NOTE — Telephone Encounter (Signed)
Will route to [facility]

## 2024-01-24 ENCOUNTER — Telehealth: Payer: Self-pay | Admitting: *Deleted

## 2024-01-24 ENCOUNTER — Encounter: Payer: Medicaid Other | Admitting: Student

## 2024-01-24 NOTE — Telephone Encounter (Signed)
 Call from asking about need to take vitamins,  Has been taking 1 A Day and Prenatal vitamins wants to know which would be best.

## 2024-01-26 ENCOUNTER — Ambulatory Visit: Payer: Medicaid Other | Admitting: Obstetrics and Gynecology

## 2024-01-26 ENCOUNTER — Other Ambulatory Visit: Payer: Self-pay

## 2024-01-26 VITALS — BP 137/88 | HR 87 | Wt 206.0 lb

## 2024-01-26 DIAGNOSIS — N939 Abnormal uterine and vaginal bleeding, unspecified: Secondary | ICD-10-CM | POA: Diagnosis not present

## 2024-01-26 DIAGNOSIS — N952 Postmenopausal atrophic vaginitis: Secondary | ICD-10-CM

## 2024-01-26 DIAGNOSIS — N941 Unspecified dyspareunia: Secondary | ICD-10-CM

## 2024-01-26 MED ORDER — ESTRING 2 MG VA RING: 1.0000 | VAGINAL_RING | VAGINAL | 4 refills | Status: DC

## 2024-01-26 NOTE — Progress Notes (Signed)
 GYNECOLOGY VISIT  Patient name: Tanya Austin MRN [id number]  Date of birth: [birth year] Chief Complaint:   AUB   History:  Tanya Austin is a 53 y.o. [phone] being seen today for follow up of GSM.     Felt the cream was just really messy, didn't see much of difference/improvement Not currently sexually active due to the bleeding; last had sex last year and the sex was painful, dryness kind of pain Has also been having pain in her vagina and stomach as well - cramping pain in stomach and sharp pain in vagina                                 Saturday not able to sleep due to the pain; has not taken anything for the pain. Not taking robaxin due to drowsiness it causes; takes gabapentin; stopped birth control pills but not sure if that's when the bleeding got heavier  Biopsy in 2016 - had bleeding Wants to wait until ultrasound to start medication for bleeidng; when she wipes her anus and cheeks as well. No itching, no d/c; innner/lower cheek ; no skin changes; feels it has gotten darker, burning is inside. May bleed w/ sex  Past Medical History:  Diagnosis Date   Diabetes mellitus    Dizziness 01/06/2022   Ear pain, right 06/24/2022   Fibroid    Headache    Herpes    High cholesterol    History of COVID-19 09/10/2020   Hx of gonorrhea 2017   Stomach pain 06/24/2022    Past Surgical History:  Procedure Laterality Date   CERVICAL BIOPSY      The following portions of the patient's history were reviewed and updated as appropriate: allergies, current medications, past family history, past medical history, past social history, past surgical history and problem list.   Health Maintenance:   Last pap     Component Value Date/Time   DIAGPAP (A) 12/30/2020 1349    - Atypical squamous cells of undetermined significance (ASC-US)   DIAGPAP  10/06/2019 1143    - Negative for intraepithelial lesion or malignancy (NILM)   HPVHIGH Negative 12/30/2020 1349   HPVHIGH Negative  10/06/2019 1143   ADEQPAP  12/30/2020 1349    Satisfactory for evaluation; transformation zone component PRESENT.   ADEQPAP  10/06/2019 1143    Satisfactory for evaluation; transformation zone component PRESENT.    High Risk HPV: Positive  Adequacy:  Satisfactory for evaluation, transformation zone component PRESENT  Diagnosis:  Atypical squamous cells of undetermined significance (ASC-US)  Last mammogram: 03/2023 BIRADS 1   Review of Systems:  Pertinent items are noted in HPI. Comprehensive review of systems was otherwise negative.   Objective:  Physical Exam BP 137/88   Pulse 87   Wt 206 lb (93.4 kg)   BMI 32.26 kg/m    Physical Exam Vitals and nursing note reviewed.  Constitutional:      Appearance: Normal appearance.  HENT:     Head: Normocephalic and atraumatic.  Pulmonary:     Effort: Pulmonary effort is normal.  Skin:    General: Skin is warm and dry.  Neurological:     General: No focal deficit present.     Mental Status: She is alert.  Psychiatric:        Mood and Affect: Mood normal.        Behavior: Behavior normal.  Thought Content: Thought content normal.        Judgment: Judgment normal.        Assessment & Plan:   1. Vaginal bleeding (Primary) 2. Abnormal uterine bleeding (AUB) 3. Dyspareunia in female Patient has abnormal uterine bleeding .  Will order pelvic ultrasound to evaluate for any structural gynecologic abnormalities.  Will contact patient with these results and plans for further evaluation/management. Will return for exam with pap and possible EMB pending pelvic ultrasound results Discussed that it's necessary to workup   - US PELVIC COMPLETE WITH TRANSVAGINAL; Future  4. Vaginal atrophy Trial of vaginal estrogen estring to manage GSM symptoms - estradiol (ESTRING) 7.5 MCG/24HR vaginal ring; Place 1 each vaginally every 3 (three) months. follow package directions  Dispense: 1 each; Refill: 4   Routine preventative health  maintenance measures emphasized.  Lorriane Shire, MD [facility], [facility]

## 2024-01-26 NOTE — Patient Instructions (Signed)
 Estring for vaginal dryness  Pelvic ultrasound

## 2024-02-04 ENCOUNTER — Other Ambulatory Visit: Payer: Self-pay | Admitting: Student

## 2024-02-04 DIAGNOSIS — B009 Herpesviral infection, unspecified: Secondary | ICD-10-CM

## 2024-02-04 NOTE — Telephone Encounter (Signed)
 Last Fill: 11/11/23  Last OV: 11/11/23 Next OV: 03/13/24  Routing to provider for review/authorization.

## 2024-02-04 NOTE — Telephone Encounter (Signed)
 Copied from CRM [id number]. Topic: Clinical - Medication Refill >> Feb 04, 2024 11:51 AM Thomes Dinning wrote: Most Recent Primary Care Visit:   Medication: valACYclovir (VALTREX) 500 MG tablet   Has the patient contacted their pharmacy? Yes Patient was advised the medication would need a prior authorization  Is this the correct pharmacy for this prescription? Yes If no, delete pharmacy and type the correct one.  This is the patient's preferred pharmacy:  [id number] [place], [place] - [address] AT [facility] OF [address] [place] [address] Phone: [phone] Fax: [phone]  [facility] [place], [place] - [address] [place] [address] Phone: [phone] Fax: [phone]   Has the prescription been filled recently? No  Is the patient out of the medication? Yes  Has the patient been seen for an appointment in the last year OR does the patient have an upcoming appointment? Yes  Can we respond through MyChart? Yes  Agent: Please be advised that Rx refills may take up to 3 business days. We ask that you follow-up with your pharmacy.

## 2024-02-05 ENCOUNTER — Ambulatory Visit (HOSPITAL_COMMUNITY)
Admission: RE | Admit: 2024-02-05 | Discharge: 2024-02-05 | Disposition: A | Discharge status: Home / Self Care | Payer: Medicaid Other | Source: Ambulatory Visit | Attending: Obstetrics and Gynecology | Admitting: Obstetrics and Gynecology

## 2024-02-05 DIAGNOSIS — N941 Unspecified dyspareunia: Secondary | ICD-10-CM | POA: Diagnosis present

## 2024-02-05 DIAGNOSIS — N939 Abnormal uterine and vaginal bleeding, unspecified: Secondary | ICD-10-CM | POA: Insufficient documentation

## 2024-02-07 ENCOUNTER — Other Ambulatory Visit: Payer: Self-pay | Admitting: Student

## 2024-02-07 DIAGNOSIS — B009 Herpesviral infection, unspecified: Secondary | ICD-10-CM

## 2024-02-07 NOTE — Telephone Encounter (Signed)
 Copied from CRM [id number]. Topic: Clinical - Medication Refill >> Feb 07, 2024  9:11 AM Philippa Chester F wrote: Most Recent Primary Care Visit:    Patient is in need of a Prior authorization of the medication listed below. Patient has been in contact with the pharmacy whom stated the prescription was coming back as a "insurance issue". They instructed the patient to call the clinic and send over a Prior Authorization for the prescription. Patient is currently out of medication. Previous refill did not specify the pharmacy in question.    Medication: valACYclovir (VALTREX) 500 MG tablet  Has the patient contacted their pharmacy? Yes  Is this the correct pharmacy for this prescription? Yes  [facility] #[id number] - [place], [place] - [address] AT [facility] OF [address] [place] [address] Phone: [phone] Fax: [phone]   Has the prescription been filled recently? No  Is the patient out of the medication? Yes  Has the patient been seen for an appointment in the last year OR does the patient have an upcoming appointment? Yes  Can we respond through MyChart? Yes  Patient would also like to be contacted by phone at [phone]   Agent: Please be advised that Rx refills may take up to 3 business days. We ask that you follow-up with your pharmacy.

## 2024-02-08 MED ORDER — VALACYCLOVIR HCL 500 MG PO TABS: ORAL_TABLET | ORAL | 3 refills | Status: AC

## 2024-02-10 NOTE — Telephone Encounter (Signed)
 Copied from CRM [id number]. Topic: Clinical - Medication Refill >> Feb 07, 2024  9:11 AM Philippa Chester F wrote: Most Recent Primary Care Visit:    Patient is in need of a Prior authorization of the medication listed below. Patient has been in contact with the pharmacy whom stated the prescription was coming back as a "insurance issue". They instructed the patient to call the clinic and send over a Prior Authorization for the prescription. Patient is currently out of medication. Previous refill did not specify the pharmacy in question.    Medication: valACYclovir (VALTREX) 500 MG tablet  Has the patient contacted their pharmacy? Yes  Is this the correct pharmacy for this prescription? Yes  [facility] #[id number] - [place], [place] - [address] AT [facility] OF [address] [place] [address] Phone: [phone] Fax: [phone]   Has the prescription been filled recently? No  Is the patient out of the medication? Yes  Has the patient been seen for an appointment in the last year OR does the patient have an upcoming appointment? Yes  Can we respond through MyChart? Yes  Patient would also like to be contacted by phone at [phone]   Agent: Please be advised that Rx refills may take up to 3 business days. We ask that you follow-up with your pharmacy. >> Feb 10, 2024  4:19 PM Dennison Nancy wrote: Patient calling on status of the prior authorization for the medication valACYclovir (VALTREX) 500 MG tablet  ,  Patient is in need of the medication and is currently out the medication , patient request to be transfer over to [facility]

## 2024-02-11 ENCOUNTER — Telehealth: Payer: Self-pay

## 2024-02-11 NOTE — Telephone Encounter (Signed)
 Prior Authorization for patient (valACYclovir HCl 500MG  tablets) came through on cover my meds was submitted with last office notes awaiting approval or denial.  KEY:[id number]

## 2024-02-11 NOTE — Telephone Encounter (Signed)
 Prior Authorization for patient has been submitted awaiting approval or denial.

## 2024-02-11 NOTE — Telephone Encounter (Addendum)
 Arriyanna Mersch (Key: [id number]) PA Case ID #: [id number] Need Help? Call us at [phone] Outcome N/A today by [facility] 2017 NCPDP We received a prior authorization request for the member and product listed above. The product does not require a prior authorization. The pharmacy must enter the appropriate Clarification Code to override the rejection. The rejections may include therapeutic duplications, drug-drug interactions, and/or high dose alerts. These rejections may also take the member's recent prescription history into consideration. The pharmacy processing the claim is able to override these rejection(s) for this request. The pharmacy may obtain assistance by contacting the [employer] Help Desk at [phone]. [facility] pharmacists, please call [phone]. Drug valACYclovir HCl 500MG  tablets ePA cloud logo Form [employer] Medicaid Electronic Prior Authorization Form [id number] NCPDP)  Patient is aware  This information has been sent to the pharmacy.

## 2024-02-14 ENCOUNTER — Telehealth: Payer: Self-pay

## 2024-02-14 NOTE — Telephone Encounter (Signed)
 Copied from CRM [id number]. Topic: Clinical - Medication Question >> Feb 14, 2024  9:06 AM Maree Krabbe H wrote: Reason for CRM: Patient called today about her rx and she stated that it says it needs a prior auth but she said it doesn't, patients callback number is [phone]. Patient only wants to speak with Mel Almond. >> Feb 14, 2024 10:44 AM Nurse Sherlean Foot wrote: Will route to PA Staff.

## 2024-02-14 NOTE — Telephone Encounter (Addendum)
 I called and spoke with the pharmacy per pharmacists the medication does not need a PA, her medication is still in process to be filled by the pharmacy. The pharmacy will let her know when her medication is ready to be picked up,she will owe a $4 co pay.I have called the patient x3 unable to reach her and unable to lvm. I sent the patient a mychart message with the same information.

## 2024-02-23 ENCOUNTER — Other Ambulatory Visit (HOSPITAL_COMMUNITY)
Admission: RE | Admit: 2024-02-23 | Discharge: 2024-02-23 | Disposition: A | Discharge status: Home / Self Care | Source: Ambulatory Visit | Attending: Obstetrics and Gynecology | Admitting: Obstetrics and Gynecology

## 2024-02-23 ENCOUNTER — Other Ambulatory Visit: Payer: Self-pay

## 2024-02-23 ENCOUNTER — Encounter: Payer: Self-pay | Admitting: Obstetrics and Gynecology

## 2024-02-23 ENCOUNTER — Ambulatory Visit: Payer: Medicaid Other | Admitting: Obstetrics and Gynecology

## 2024-02-23 VITALS — BP 126/78 | HR 101 | Wt 213.0 lb

## 2024-02-23 DIAGNOSIS — N941 Unspecified dyspareunia: Secondary | ICD-10-CM

## 2024-02-23 DIAGNOSIS — G8929 Other chronic pain: Secondary | ICD-10-CM

## 2024-02-23 DIAGNOSIS — R102 Pelvic and perineal pain: Secondary | ICD-10-CM

## 2024-02-23 DIAGNOSIS — N9089 Other specified noninflammatory disorders of vulva and perineum: Secondary | ICD-10-CM

## 2024-02-23 DIAGNOSIS — Z124 Encounter for screening for malignant neoplasm of cervix: Secondary | ICD-10-CM | POA: Insufficient documentation

## 2024-02-23 DIAGNOSIS — B009 Herpesviral infection, unspecified: Secondary | ICD-10-CM

## 2024-02-23 DIAGNOSIS — N939 Abnormal uterine and vaginal bleeding, unspecified: Secondary | ICD-10-CM

## 2024-02-23 NOTE — Patient Instructions (Addendum)
 [facility], [facility], [facility], [facility]

## 2024-02-23 NOTE — Progress Notes (Signed)
 GYNECOLOGY VISIT  Patient name: Tanya Austin MRN [id number]  Date of birth: 1971/07/27 Chief Complaint:   Gynecologic Exam and Vaginal Bleeding  History:  Tanya Austin is a 53 y.o. [id number] being seen today for vaginal burning and pap.    Last seen : vaginal dryness and didn't like the cream due to it being messay, heavy menstrual bleeding as well as occasional postcoital bleeding. Declined starting medication for bleeding until [place] results completed. Reports history of biopsy in 2016 for indication of bleeding as well. Given Estring for GSM.   Discussed the use of AI scribe software for clinical note transcription with the patient, who gave verbal consent to proceed.  History of Present Illness Tanya Austin is a 53 year old female who presents with heavy menstrual bleeding and vaginal burning.  She experiences heavy menstrual bleeding, which she attributes to fibroids identified in a recent ultrasound. One fibroid is located near the lining of the uterus, which may be contributing to the bleeding. She has a history of a uterine biopsy in 2016, and the lining of the uterus appears normal.  She experiences vaginal burning, which she has been managing with Valtrex for several years as she was told that it would help. She recently started using the E-string vaginal ring and has noticed a decrease in burning symptoms since its insertion. The burning is primarily felt when she uses the restroom and wipes. She has a history of herpes outbreaks but cannot recall the last occurrence. She has had the vaginal burning since her 30s and has not had anything that helps.  During the review of symptoms, she mentions pain in the pelvic area and stomach, which she associates with fibroids. She experiences pain during pelvic examinations, particularly on the right side, with pain levels reaching up to 10 out of 10.  She reports itching and irritation in the vaginal area, which she attributes to  shaving.     Past Medical History:  Diagnosis Date   Diabetes mellitus    Dizziness 01/06/2022   Ear pain, right 06/24/2022   Fibroid    Headache    Herpes    High cholesterol    History of COVID-19 09/10/2020   Hx of gonorrhea 2017   Stomach pain 06/24/2022    Past Surgical History:  Procedure Laterality Date   CERVICAL BIOPSY      The following portions of the patient's history were reviewed and updated as appropriate: allergies, current medications, past family history, past medical history, past social history, past surgical history and problem list.   Health Maintenance:   Last pap     Component Value Date/Time   DIAGPAP (A) 12/30/2020 1349    - Atypical squamous cells of undetermined significance (ASC-US)   DIAGPAP  10/06/2019 1143    - Negative for intraepithelial lesion or malignancy (NILM)   HPVHIGH Negative 12/30/2020 1349   HPVHIGH Negative 10/06/2019 1143   ADEQPAP  12/30/2020 1349    Satisfactory for evaluation; transformation zone component PRESENT.   ADEQPAP  10/06/2019 1143    Satisfactory for evaluation; transformation zone component PRESENT.    High Risk HPV: Positive  Adequacy:  Satisfactory for evaluation, transformation zone component PRESENT  Diagnosis:  Atypical squamous cells of undetermined significance (ASC-US)  Last mammogram: 03/2023 BIRADS 1   Review of Systems:  Pertinent items are noted in HPI. Comprehensive review of systems was otherwise negative.   Objective:  Physical Exam BP 126/78   Pulse (!) 101  Wt 213 lb (96.6 kg)   LMP 02/16/2024 (Within Days)   BMI 33.36 kg/m    Physical Exam Vitals and nursing note reviewed. Exam conducted with a chaperone present.  Constitutional:      Appearance: Normal appearance.  HENT:     Head: Normocephalic and atraumatic.  Pulmonary:     Effort: Pulmonary effort is normal.     Breath sounds: Normal breath sounds.  Genitourinary:    General: Normal vulva.     Exam position:  Lithotomy position.     Vagina: Normal.     Cervix: Normal.       Comments: Tender lesion on right labia majora and inner groin fold Atrophic cervix with stenotic os Estring noted within vault  Normal vulvar sensation bilaterally Nontender superficial pelvic floor muscles Nontender ischial tuberosities bilaterally  Allodynia at introitus: No  Anal wink present Posterior vaginal wall nontender Right levator ani 4/10 Right ischiococcygeous 10/10 Right obturator internus 8/10 Left levator ani 1/10 Left ischioccocygeous 8/10 Left obturator internus 8/10 Uterus non-tender    Skin:    General: Skin is warm and dry.  Neurological:     General: No focal deficit present.     Mental Status: She is alert.  Psychiatric:        Mood and Affect: Mood normal.        Behavior: Behavior normal.        Thought Content: Thought content normal.        Judgment: Judgment normal.      Labs and Imaging US PELVIC COMPLETE WITH TRANSVAGINAL Result Date: 02/22/2024 CLINICAL DATA:  vaginal burning. EXAM: TRANSABDOMINAL AND TRANSVAGINAL ULTRASOUND OF PELVIS TECHNIQUE: Both transabdominal and transvaginal ultrasound examinations of the pelvis were performed. Transabdominal technique was performed for global imaging of the pelvis including uterus, ovaries, adnexal regions, and pelvic cul-de-sac. It was necessary to proceed with endovaginal exam following the transabdominal exam to visualize the bilateral ovaries. COMPARISON:  None Available. FINDINGS: Uterus Measurements: 6.9 x 7.9 x 10.4 cm. = volume: 295.4 mL. There are several uterine leiomyomas with largest measuring up to 1.6 x 2.7 x 2.9 cm. The largest leiomyoma is in the right side of the upper uterine body and has approximately 25% submucosal component. There is a peripheral/subserosal partially calcified leiomyoma measuring 1.7 x 1.7 x 2.3 cm. There is an additional left fundal intramural leiomyoma measuring 2.0 x 2.1 x 2.2 cm. Endometrium  Thickness: 17.0.  No focal abnormality visualized. Right ovary Measurements: 1.7 x 2.1 x 2.8 cm. = volume: 5.2 mL. Normal appearance/no adnexal mass. Probable hemorrhagic corpus luteal cyst noted in the left ovary. Left ovary Measurements: 2.6 x 3.0 x 3.6 cm. = volume: 14.7 mL. Normal appearance/no adnexal mass. Other findings No abnormal free fluid. IMPRESSION: 1. Multiple uterine leiomyomas with the largest measuring up to 2.9 cm in the right side of the upper uterine body with approximately 25% submucosal component. 2. Otherwise essentially unremarkable exam. Electronically Signed   By: Jules Schick M.D.   On: 02/22/2024 18:56       Assessment & Plan:   Assessment & Plan Uterine fibroids Small fibroids identified, one near endometrium causing menorrhagia. Endometrial lining normal. Discontinuation of birth control may contribute to bleeding. Discussed hormonal and surgical treatment options. She prefers endometrial ablation without general anesthesia. - Provide information on clinics offering endometrial ablation without general anesthesia. - Uterine fibroids: The patient's fibroids are symptomatic and treatment options of expectant management, medical therapy, and surgical therapy were discussed. - Expectant  management - The patient's fibroids were discussed and expectant management was offered with strict precautions. - We discussed medical management with progesterone only options including - POP, Depo Provera and Lng-IUD.  We reviewed risks and benefits and proper use. Progesterone Only Birth Control Pills (POP)- The use of progesterone only birth control pills was discussed with the patient. The control of fibroid symptoms was discussed and the risks/benefits of therapy were discussed. Patient not interested in taking any additional medications - We discussed surgical/procedural options available: RFA (I.e. Sonata), [place], myomectomy and hysterectomy. We discussed the risks and benefits for each  of these specific procedures. Patient does not want to be placed under anesthesia  - Following counseling, the patient would like to desires endometrial ablation without anesthesia; noted this is not available in our practice but there are other practices in the city that may be able to offer this service  Pelvic pain Pain localized to pelvic floor muscles, likely due to dysfunction. Recommended pelvic floor physical therapy. - Noted to have continued achy, dull pain after pelvic floor muscle exam, consistent with pelvic floor myalgia - Refer to pelvic floor physical therapy for evaluation and treatment.  Vulvar burning Burning decreased with E-string vaginal ring. No recent herpes outbreaks. Symptoms primarily occur post-urination. - Continue valtrex episodically for breakouts - Monitor symptoms with continued vaginal ring use. - If no improvement with e-string, consider neuromodulator for pain  Vaginal atrophy Suspected due to decreased estrogen. E-string vaginal ring expected to alleviate symptoms. - Continue E-string vaginal ring. - Advise using moist wipes or bidets to reduce irritation when wiping. - follow up in about 3 months to assess response  Folliculitis Itchy lesion on right vulva likely from shaving. No herpes outbreak. - Advise against shaving until healing occurs. - Consider using an electric trimmer to prevent skin cuts.    Routine preventative health maintenance measures emphasized.  Lorriane Shire, MD [facility], [facility]

## 2024-02-24 ENCOUNTER — Other Ambulatory Visit: Payer: Self-pay | Admitting: Obstetrics and Gynecology

## 2024-02-24 DIAGNOSIS — Z1231 Encounter for screening mammogram for malignant neoplasm of breast: Secondary | ICD-10-CM

## 2024-02-29 ENCOUNTER — Encounter: Payer: Self-pay | Admitting: Obstetrics and Gynecology

## 2024-02-29 LAB — CYTOLOGY - PAP
Adequacy: ABSENT
Comment: NEGATIVE
Diagnosis: NEGATIVE
High risk HPV: NEGATIVE

## 2024-03-13 ENCOUNTER — Telehealth: Payer: Self-pay

## 2024-03-13 ENCOUNTER — Other Ambulatory Visit: Payer: Self-pay

## 2024-03-13 ENCOUNTER — Encounter: Payer: Self-pay | Admitting: Student

## 2024-03-13 ENCOUNTER — Ambulatory Visit: Payer: Medicaid Other | Admitting: Student

## 2024-03-13 VITALS — BP 122/68 | HR 72 | Temp 97.9°F | Ht 66.0 in | Wt 218.3 lb

## 2024-03-13 DIAGNOSIS — Z7985 Long-term (current) use of injectable non-insulin antidiabetic drugs: Secondary | ICD-10-CM | POA: Diagnosis not present

## 2024-03-13 DIAGNOSIS — E6609 Other obesity due to excess calories: Secondary | ICD-10-CM

## 2024-03-13 DIAGNOSIS — E119 Type 2 diabetes mellitus without complications: Secondary | ICD-10-CM

## 2024-03-13 DIAGNOSIS — J309 Allergic rhinitis, unspecified: Secondary | ICD-10-CM | POA: Diagnosis present

## 2024-03-13 DIAGNOSIS — Z7984 Long term (current) use of oral hypoglycemic drugs: Secondary | ICD-10-CM

## 2024-03-13 DIAGNOSIS — E1169 Type 2 diabetes mellitus with other specified complication: Secondary | ICD-10-CM

## 2024-03-13 DIAGNOSIS — E785 Hyperlipidemia, unspecified: Secondary | ICD-10-CM | POA: Diagnosis not present

## 2024-03-13 DIAGNOSIS — Z6835 Body mass index (BMI) 35.0-35.9, adult: Secondary | ICD-10-CM

## 2024-03-13 LAB — POCT GLYCOSYLATED HEMOGLOBIN (HGB A1C): Hemoglobin A1C: 8.1 % — AB (ref 4.0–5.6)

## 2024-03-13 LAB — GLUCOSE, CAPILLARY: Glucose-Capillary: 183 mg/dL — ABNORMAL HIGH (ref 70–99)

## 2024-03-13 MED ORDER — FEXOFENADINE HCL 180 MG PO TABS: 180.0000 mg | ORAL_TABLET | Freq: Every day | ORAL | 3 refills | Status: DC

## 2024-03-13 MED ORDER — METFORMIN HCL 1000 MG PO TABS: ORAL_TABLET | ORAL | 0 refills | Status: DC

## 2024-03-13 MED ORDER — SEMAGLUTIDE(0.25 OR 0.5MG/DOS) 2 MG/3ML ~~LOC~~ SOPN: 0.5000 mg | PEN_INJECTOR | SUBCUTANEOUS | 0 refills | Status: DC

## 2024-03-13 MED ORDER — SEMAGLUTIDE(0.25 OR 0.5MG/DOS) 2 MG/3ML ~~LOC~~ SOPN: 0.2500 mg | PEN_INJECTOR | SUBCUTANEOUS | 3 refills | Status: DC

## 2024-03-13 NOTE — Telephone Encounter (Signed)
 Tanya Austin (Key: [id number]) Rx #: [id number] Ozempic (0.25 or 0.5 MG/DOSE) 2MG Tanya Austin pen-injectors Form [employer] Electronic Prior Authorization Form (2017 NCPDP) Created Sent to Plan Plan Response Submit Clinical Questions Determination Favorable Message from Plan Request Reference Number: [id number]. OZEMPIC INJ 2MG /3ML is approved through 03/13/2025. For further questions, call [employer] at [phone].Tanya Austin Authorization Expiration Date: March 13, 2025.

## 2024-03-13 NOTE — Patient Instructions (Signed)
 Thank you, Tanya Austin for allowing us  to provide your care today. Today we discussed diabetes and weight loss.    For the diabetes: please take 1.5 tablets of the metformin 1,000 mg for one week. Then take 2 tablets of 1,000 mg following. I have started you on ozempic, please inject (0.25mg ) once per week for a total of 4 weeks, then pick up the next dose (0.5mg ). While you are using the 0.5mg  dose, please either call or send a mychart message to let us  know how you are doing on this medication.   I have ordered the following labs for you:   Lab Orders         BMP8+Anion Gap         Glucose, capillary         POC Hbg A1C       Referrals ordered today:    Referral Orders         Ambulatory referral to Ophthalmology         Referral to Nutrition and Diabetes Services      I have ordered the following medication/changed the following medications:   Stop the following medications: Medications Discontinued During This Encounter  Medication Reason   cetirizine (ZYRTEC ALLERGY) 10 MG tablet Change in therapy   ibuprofen (ADVIL) 800 MG tablet Discontinued by provider   methocarbamol (ROBAXIN-750) 750 MG tablet Discontinued by provider   metFORMIN (GLUCOPHAGE) 1000 MG tablet Reorder     Start the following medications: Meds ordered this encounter  Medications   fexofenadine (ALLEGRA) 180 MG tablet    Sig: Take 1 tablet (180 mg total) by mouth daily.    Dispense:  90 tablet    Refill:  3   metFORMIN (GLUCOPHAGE) 1000 MG tablet    Sig: Take 1.5 tablets (1,500 mg total) by mouth daily with breakfast for 7 days, THEN 2 tablets (2,000 mg total) daily with breakfast. Continue to take 2,000 mg daily until your follow up appointment..    Dispense:  71 tablet    Refill:  0   Semaglutide,0.25 or 0.5MG /DOS, 2 MG/3ML SOPN    Sig: Inject 0.25 mg into the skin once a week. We will increase the dose after 4 weeks.    Dispense:  2 mL    Refill:  3   Semaglutide,0.25 or 0.5MG /DOS, 2  MG/3ML SOPN    Sig: Inject 0.5 mg into the skin once a week. Do not start this medication until you complete the 4 week duration on Ozempic 0.25mg .    Dispense:  3 mL    Refill:  0     Follow up: 3 months    Remember: To call us  in 1-2 months to see how you are doing!   Should you have any questions or concerns please call the internal medicine clinic at [phone].     Please note that our late policy has changed.  If you are more than 15 minutes late to your appointment, you may be asked to reschedule your appointment.  Dr. Sharlon Deacon, D.O. [facility]

## 2024-03-13 NOTE — Assessment & Plan Note (Signed)
 Patient presents with a history of T2DM with a prior A1c of 6.2 in September 2024.  Patient's A1c today is 8.1 .  They are on a regimen of Metformin 1,000mg  daily.   Plan: -Increase Metformin to 1,500mg  for one week then to 2,000mg  daily, will start Ozempic 0.25 mg injections.  -A1c completed today -Ophthalmology referral sent for exam -Urine ACR UTD, due in July 2025 -Three month follow up

## 2024-03-13 NOTE — Progress Notes (Signed)
 Internal Medicine Clinic Attending  Case discussed with the resident at the time of the visit.  We reviewed the resident's history and exam and pertinent patient test results.  I agree with the assessment, diagnosis, and plan of care documented in the resident's note.

## 2024-03-13 NOTE — Progress Notes (Signed)
 Established Patient Office Visit  Subjective   Patient ID: Tanya Austin, female    DOB: [birth year]  Age: 53 y.o. MRN: [id number]  Chief Complaint  Patient presents with   Follow-up   Diabetes   Hyperlipidemia    Tanya Austin is a 53 y.o. who presents to the clinic for a follow up of T2DM. Please see problem based assessment and plan for additional details.   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   Healthcare maintenance 11/20/2021   Vulvar burning 11/17/2021   Type 2 diabetes mellitus with hyperlipidemia (HCC) 08/26/2021   Hyperlipidemia 08/26/2021   Plantar fasciitis 08/26/2021   Bacterial vaginosis 08/04/2016      Objective:     BP 122/68 (BP Location: Left Arm, Patient Position: Sitting, Cuff Size: Large)   Pulse 72   Temp 97.9 F (36.6 C) (Oral)   Ht 5\' 6"  (1.676 m)   Wt 218 lb 4.8 oz (99 kg)   LMP 02/16/2024 (Within Days)   SpO2 100%   BMI 35.23 kg/m  BP Readings from Last 3 Encounters:  03/13/24 122/68  02/23/24 126/78  01/26/24 137/88   Wt Readings from Last 3 Encounters:  03/13/24 218 lb 4.8 oz (99 kg)  02/23/24 213 lb (96.6 kg)  01/26/24 206 lb (93.4 kg)      Physical Exam Vitals reviewed.  Constitutional:      Appearance: She is obese.  Cardiovascular:     Rate and Rhythm: Normal rate and regular rhythm.     Heart sounds: Normal heart sounds. No murmur heard. Pulmonary:     Effort: Pulmonary effort is normal. No respiratory distress.     Breath sounds:  Normal breath sounds. No wheezing or rales.  Abdominal:     Palpations: Abdomen is soft.     Tenderness: There is no abdominal tenderness. There is no guarding.  Musculoskeletal:     Right lower leg: No edema.     Left lower leg: No edema.  Skin:    General: Skin is warm and dry.  Neurological:     Mental Status: She is alert.  Psychiatric:        Mood and Affect: Mood normal.        Behavior: Behavior normal. Behavior is cooperative.      Results for orders placed or performed in visit on 03/13/24  Glucose, capillary  Result Value Ref Range   Glucose-Capillary 183 (H) 70 - 99 mg/dL  POC Hbg N8G  Result Value Ref Range   Hemoglobin A1C 8.1 (A) 4.0 - 5.6 %   HbA1c POC (<> result, manual entry)     HbA1c, POC (prediabetic range)     HbA1c, POC (controlled diabetic range)      Last metabolic panel Lab Results  Component Value Date   GLUCOSE 112 (H) 10/08/2023   NA 139 10/08/2023   K 5.0 10/08/2023   CL 105 10/08/2023   CO2 20 10/08/2023   BUN 13 10/08/2023   CREATININE 0.75 10/08/2023   EGFR 97 10/08/2023  CALCIUM 9.8 10/08/2023   PROT 6.5 10/08/2023   ALBUMIN 4.1 10/08/2023   LABGLOB 2.4 10/08/2023   AGRATIO 1.6 01/06/2022   BILITOT 0.4 10/08/2023   ALKPHOS 67 10/08/2023   AST 14 10/08/2023   ALT 15 10/08/2023   ANIONGAP 6 10/24/2017   Last lipids Lab Results  Component Value Date   CHOL 155 06/15/2023   HDL 49 06/15/2023   LDLCALC 94 06/15/2023   TRIG 61 06/15/2023   CHOLHDL 3.2 06/15/2023   Last hemoglobin A1c Lab Results  Component Value Date   HGBA1C 8.1 (A) 03/13/2024      The 10-year ASCVD risk score (Arnett DK, et al., 2019) is: 3.9%    Assessment & Plan:   Problem List Items Addressed This Visit       Respiratory   Allergic rhinosinusitis   Zytrec causes sedation in patient, will discontinue and try allegra.         Endocrine   Type 2 diabetes mellitus with hyperlipidemia [facility])   Patient presents with a history of T2DM with a  prior A1c of 6.2 in September 2024.  Patient's A1c today is 8.1 .  They are on a regimen of Metformin 1,000mg  daily.   Plan: -Increase Metformin to 1,500mg  for one week then to 2,000mg  daily, will start Ozempic 0.25 mg injections.  -A1c completed today -Ophthalmology referral sent for exam -Urine ACR UTD, due in July 2025 -Three month follow up       Relevant Medications   metFORMIN (GLUCOPHAGE) 1000 MG tablet   Semaglutide,0.25 or 0.5MG /DOS, 2 MG/3ML SOPN   Semaglutide,0.25 or 0.5MG /DOS, 2 MG/3ML SOPN (Start on 04/03/2024)     Other   Hyperlipidemia (Chronic)   She is on a regimen of simvastatin 20mg  daily and her prior LDL was 94 in July 2024.  Plan: -LDL in July 2025 -Continue simvastatin 20mg        Obesity due to excess calories with serious comorbidity (Chronic)   Patient presents with obesity with a BMI of 35.23.  Patient is aware of her obesity and associated health conditions including type 2 diabetes.  We discussed diet changes and exercise, patient is committed to starting diet changes by eating smaller portions and reports exercise of 30 minutes walking 5 days a week.  Overall, patient is motivated for weight loss and is committed to making the lifestyle changes.  She denies family or self history of medullary thyroid cancer, men type II syndrome, current lactation or pregnancy. Plan: -Ozempic 0.25 mg weekly -Nutrition referral placed  -Continue exercise: 30 minutes daily 5 days a week  -Began eating smaller portions.       Relevant Medications   metFORMIN (GLUCOPHAGE) 1000 MG tablet   Semaglutide,0.25 or 0.5MG /DOS, 2 MG/3ML SOPN   Semaglutide,0.25 or 0.5MG /DOS, 2 MG/3ML SOPN (Start on 04/03/2024)   Other Visit Diagnoses       Type 2 diabetes mellitus without complication, without long-term current use of insulin (HCC)    -  Primary   Relevant Medications   metFORMIN (GLUCOPHAGE) 1000 MG tablet   Semaglutide,0.25 or 0.5MG /DOS, 2 MG/3ML SOPN   Semaglutide,0.25 or  0.5MG /DOS, 2 MG/3ML SOPN (Start on 04/03/2024)   Other Relevant Orders   POC Hbg A1C (Completed)   BMP8+Anion Gap   Ambulatory referral to Ophthalmology   Referral to Nutrition and Diabetes Services        Return in about 3 months (around 06/12/2024) for DM follow up, obesity .    Faith Rogue, DO

## 2024-03-13 NOTE — Assessment & Plan Note (Signed)
 Zytrec causes sedation in patient, will discontinue and try allegra.

## 2024-03-13 NOTE — Telephone Encounter (Signed)
 Prior Authorization for patient (Ozempic (0.25 or 0.5 MG/DOSE) 2MG /3ML pen-injectors) came through on cover my meds was submitted with last office notes and labs awaiting approval or denial.  ZOX:[id number]

## 2024-03-13 NOTE — Assessment & Plan Note (Signed)
 Patient presents with obesity with a BMI of 35.23.  Patient is aware of her obesity and associated health conditions including type 2 diabetes.  We discussed diet changes and exercise, patient is committed to starting diet changes by eating smaller portions and reports exercise of 30 minutes walking 5 days a week.  Overall, patient is motivated for weight loss and is committed to making the lifestyle changes.  She denies family or self history of medullary thyroid cancer, men type II syndrome, current lactation or pregnancy. Plan: -Ozempic 0.25 mg weekly -Nutrition referral placed  -Continue exercise: 30 minutes daily 5 days a week  -Began eating smaller portions.

## 2024-03-13 NOTE — Assessment & Plan Note (Signed)
 She is on a regimen of simvastatin 20mg  daily and her prior LDL was 94 in July 2024.  Plan: -LDL in July 2025 -Continue simvastatin 20mg 

## 2024-03-14 LAB — BMP8+ANION GAP
Anion Gap: 14 mmol/L (ref 10.0–18.0)
BUN/Creatinine Ratio: 18 (ref 9–23)
BUN: 11 mg/dL (ref 6–24)
CO2: 21 mmol/L (ref 20–29)
Calcium: 9.6 mg/dL (ref 8.7–10.2)
Chloride: 103 mmol/L (ref 96–106)
Creatinine, Ser: 0.6 mg/dL (ref 0.57–1.00)
Glucose: 158 mg/dL — ABNORMAL HIGH (ref 70–99)
Potassium: 4.5 mmol/L (ref 3.5–5.2)
Sodium: 138 mmol/L (ref 134–144)
eGFR: 109 mL/min/{1.73_m2} (ref >59)

## 2024-03-23 ENCOUNTER — Ambulatory Visit
Admission: RE | Admit: 2024-03-23 | Discharge: 2024-03-23 | Disposition: A | Discharge status: Home / Self Care | Source: Ambulatory Visit | Attending: Obstetrics and Gynecology

## 2024-03-23 DIAGNOSIS — Z1231 Encounter for screening mammogram for malignant neoplasm of breast: Secondary | ICD-10-CM

## 2024-03-28 ENCOUNTER — Encounter: Payer: Self-pay | Admitting: Obstetrics and Gynecology

## 2024-04-02 ENCOUNTER — Other Ambulatory Visit: Payer: Self-pay | Admitting: Student

## 2024-04-19 ENCOUNTER — Ambulatory Visit: Attending: Obstetrics and Gynecology | Admitting: Physical Therapy

## 2024-04-19 ENCOUNTER — Other Ambulatory Visit: Payer: Self-pay

## 2024-04-19 ENCOUNTER — Encounter: Payer: Self-pay | Admitting: Physical Therapy

## 2024-04-19 DIAGNOSIS — M5459 Other low back pain: Secondary | ICD-10-CM | POA: Diagnosis present

## 2024-04-19 DIAGNOSIS — N941 Unspecified dyspareunia: Secondary | ICD-10-CM | POA: Diagnosis not present

## 2024-04-19 DIAGNOSIS — R102 Pelvic and perineal pain: Secondary | ICD-10-CM | POA: Insufficient documentation

## 2024-04-19 DIAGNOSIS — R279 Unspecified lack of coordination: Secondary | ICD-10-CM

## 2024-04-19 DIAGNOSIS — G8929 Other chronic pain: Secondary | ICD-10-CM | POA: Diagnosis not present

## 2024-04-19 DIAGNOSIS — M62830 Muscle spasm of back: Secondary | ICD-10-CM

## 2024-04-19 DIAGNOSIS — M62838 Other muscle spasm: Secondary | ICD-10-CM

## 2024-04-19 NOTE — Patient Instructions (Signed)
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
## 2024-04-27 ENCOUNTER — Encounter: Payer: Self-pay | Admitting: Physical Therapy

## 2024-04-27 ENCOUNTER — Ambulatory Visit: Admitting: Physical Therapy

## 2024-04-27 DIAGNOSIS — R279 Unspecified lack of coordination: Secondary | ICD-10-CM

## 2024-04-27 DIAGNOSIS — M62838 Other muscle spasm: Secondary | ICD-10-CM

## 2024-04-27 DIAGNOSIS — M62830 Muscle spasm of back: Secondary | ICD-10-CM

## 2024-04-27 DIAGNOSIS — M5459 Other low back pain: Secondary | ICD-10-CM
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
## 2024-04-28 ENCOUNTER — Ambulatory Visit: Admitting: Physical Therapy

## 2024-04-28 ENCOUNTER — Encounter: Payer: Self-pay | Admitting: Physical Therapy

## 2024-04-28 DIAGNOSIS — R279 Unspecified lack of coordination: Secondary | ICD-10-CM

## 2024-04-28 DIAGNOSIS — M62838 Other muscle spasm: Secondary | ICD-10-CM

## 2024-04-28 DIAGNOSIS — M5459 Other low back pain: Secondary | ICD-10-CM

## 2024-04-28 DIAGNOSIS — M62830 Muscle spasm of back: Secondary | ICD-10-CM
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
## 2024-05-03 ENCOUNTER — Ambulatory Visit: Attending: Obstetrics and Gynecology

## 2024-05-03 DIAGNOSIS — M62830 Muscle spasm of back: Secondary | ICD-10-CM | POA: Diagnosis present

## 2024-05-03 DIAGNOSIS — M5459 Other low back pain: Secondary | ICD-10-CM | POA: Insufficient documentation

## 2024-05-03 DIAGNOSIS — R279 Unspecified lack of coordination: Secondary | ICD-10-CM | POA: Insufficient documentation

## 2024-05-03 DIAGNOSIS — R262 Difficulty in walking, not elsewhere classified: Secondary | ICD-10-CM | POA: Diagnosis present

## 2024-05-03 DIAGNOSIS — M6281 Muscle weakness (generalized): Secondary | ICD-10-CM | POA: Insufficient documentation

## 2024-05-03 DIAGNOSIS — R252 Cramp and spasm: Secondary | ICD-10-CM | POA: Insufficient documentation

## 2024-05-03 DIAGNOSIS — R293 Abnormal posture: Secondary | ICD-10-CM | POA: Diagnosis present

## 2024-05-03 DIAGNOSIS — M62838 Other muscle spasm: Secondary | ICD-10-CM | POA: Diagnosis present
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
## 2024-05-07 ENCOUNTER — Other Ambulatory Visit: Payer: Self-pay | Admitting: Student

## 2024-05-07 DIAGNOSIS — E119 Type 2 diabetes mellitus without complications: Secondary | ICD-10-CM

## 2024-05-08 ENCOUNTER — Ambulatory Visit

## 2024-05-08 ENCOUNTER — Other Ambulatory Visit: Payer: Self-pay | Admitting: Student

## 2024-05-08 DIAGNOSIS — M5459 Other low back pain: Secondary | ICD-10-CM

## 2024-05-08 DIAGNOSIS — M6281 Muscle weakness (generalized): Secondary | ICD-10-CM

## 2024-05-08 DIAGNOSIS — R262 Difficulty in walking, not elsewhere classified: Secondary | ICD-10-CM

## 2024-05-08 DIAGNOSIS — R293 Abnormal posture: Secondary | ICD-10-CM

## 2024-05-08 DIAGNOSIS — R252 Cramp and spasm: Secondary | ICD-10-CM

## 2024-05-08 MED ORDER — METFORMIN HCL ER (OSM) 1000 MG PO TB24: 1000.0000 mg | ORAL_TABLET | Freq: Every day | ORAL | 3 refills | Status: DC
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
## 2024-05-09 ENCOUNTER — Telehealth: Payer: Self-pay

## 2024-05-09 NOTE — Telephone Encounter (Signed)
 Prior Authorization for patient (metFORMIN  HCl ER (OSM) 1000MG  er tablets) came through on cover my meds was submitted with last office notes and labs awaiting approval or denial.  KEY:[id number]

## 2024-05-09 NOTE — Telephone Encounter (Signed)
 Skip Dull (KeyRhae Cease) PA Case ID #: [id number] Rx #: [id number] Need Help? Call us  at [phone] Outcome Approved today by [facility] 2017 NCPDP Request Reference Number: [id number]. METFORMN OSM TAB 1000 ER is approved through 05/09/2025. For further questions, call [employer] at [phone]. Effective Date: 05/09/2024 Authorization Expiration Date: 05/09/2025 Drug metFORMIN  HCl ER (OSM) 1000MG  er tablets ePA cloud logo Form [employer] Electronic Prior Authorization Form (2017 NCPDP) Original Claim Info 70 PA Req, Dr submit ePA at [email or web address] MD Call [phone], Possible 3 [id number] 03 Lvl of Service PA# 72 PA TYPE8

## 2024-05-18 ENCOUNTER — Ambulatory Visit

## 2024-05-18 DIAGNOSIS — R262 Difficulty in walking, not elsewhere classified: Secondary | ICD-10-CM

## 2024-05-18 DIAGNOSIS — M5459 Other low back pain: Secondary | ICD-10-CM

## 2024-05-18 DIAGNOSIS — R293 Abnormal posture: Secondary | ICD-10-CM | POA: Diagnosis not present

## 2024-05-18 DIAGNOSIS — M6281 Muscle weakness (generalized): Secondary | ICD-10-CM

## 2024-05-18 DIAGNOSIS — M62830 Muscle spasm of back: Secondary | ICD-10-CM

## 2024-05-18 DIAGNOSIS — R252 Cramp and spasm: Secondary | ICD-10-CM
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
## 2024-05-23 ENCOUNTER — Encounter: Payer: Self-pay | Admitting: Physical Therapy

## 2024-05-23 ENCOUNTER — Ambulatory Visit: Admitting: Physical Therapy

## 2024-05-23 DIAGNOSIS — R252 Cramp and spasm: Secondary | ICD-10-CM

## 2024-05-23 DIAGNOSIS — R293 Abnormal posture: Secondary | ICD-10-CM | POA: Diagnosis not present

## 2024-05-23 DIAGNOSIS — M62830 Muscle spasm of back: Secondary | ICD-10-CM

## 2024-05-23 DIAGNOSIS — R279 Unspecified lack of coordination: Secondary | ICD-10-CM

## 2024-05-23 DIAGNOSIS — M6281 Muscle weakness (generalized): Secondary | ICD-10-CM

## 2024-05-23 DIAGNOSIS — R262 Difficulty in walking, not elsewhere classified: Secondary | ICD-10-CM

## 2024-05-23 DIAGNOSIS — M62838 Other muscle spasm: Secondary | ICD-10-CM

## 2024-05-23 DIAGNOSIS — M5459 Other low back pain: Secondary | ICD-10-CM
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
## 2024-05-24 ENCOUNTER — Ambulatory Visit: Admitting: Obstetrics and Gynecology

## 2024-05-24 ENCOUNTER — Other Ambulatory Visit: Payer: Self-pay

## 2024-05-24 ENCOUNTER — Other Ambulatory Visit (HOSPITAL_COMMUNITY)
Admission: RE | Admit: 2024-05-24 | Discharge: 2024-05-24 | Disposition: A | Discharge status: Home / Self Care | Source: Ambulatory Visit | Attending: Obstetrics and Gynecology | Admitting: Obstetrics and Gynecology

## 2024-05-24 ENCOUNTER — Encounter: Payer: Self-pay | Admitting: Obstetrics and Gynecology

## 2024-05-24 VITALS — BP 138/86 | HR 94 | Wt 209.6 lb

## 2024-05-24 DIAGNOSIS — N898 Other specified noninflammatory disorders of vagina: Secondary | ICD-10-CM

## 2024-05-24 DIAGNOSIS — N952 Postmenopausal atrophic vaginitis: Secondary | ICD-10-CM | POA: Diagnosis not present

## 2024-05-24 DIAGNOSIS — N941 Unspecified dyspareunia: Secondary | ICD-10-CM

## 2024-05-24 MED ORDER — VAGIFEM 10 MCG VA TABS: 1.0000 | ORAL_TABLET | VAGINAL | 12 refills | Status: DC

## 2024-05-24 NOTE — Progress Notes (Signed)
 GYNECOLOGY VISIT  Patient name: Tanya Austin MRN [id number]  Date of birth: 1971/07/25 Chief Complaint:   Gynecologic Exam (Vaginal estrogen and BV, having a burning sensation that's become worse )   History:  Tanya Austin is a 53 y.o. [phone] being seen today for vaginal burning, concern for BV and concern the estring  isn't all the way inside.   Doesn't have the increase in burning except with BV including in between the butt cheeks and feels distinctly from HSV outbreaks.   Bleeding with intercourse has stopped but still having monthly cycles  Only saw PT a few times  Did not use tools; had sex to see if there was bleeding and did not have any bleeding   Discussed the use of AI scribe software for clinical note transcription with the patient, who gave verbal consent to proceed.  History of Present Illness Tanya Austin is a 53 year old female who presents with persistent vaginal burning and itching.  She experiences persistent vaginal burning that has not improved with the use of the E-string (vaginal estrogen ring) over the past three months. Initially, she tried a vaginal estrogen cream but found it too messy and ineffective. The burning sensation is constant and not limited to intercourse. She also experiences discomfort when sitting and is concerned that the ring may not be inserted correctly.  There is a recent increase in vaginal itching, which she associates with episodes of bacterial vaginosis (BV). The burning is more intense during these episodes, with itching occurring internally and around the vulva. She has not had a sexual partner in over a year but recently engaged in protected intercourse.  She has a history of recurrent boils, which she attributes to a family tendency, as her grandfather and daughter also experience similar issues. She notes that the boils are not related to shaving, as they occur even without shaving.  She is currently taking six  oral medications in the morning, though specific medications are not detailed.    Past Medical History:  Diagnosis Date   Diabetes mellitus    Dizziness 01/06/2022   Ear pain, right 06/24/2022   Fibroid    Headache    Herpes    High cholesterol    History of COVID-19 09/10/2020   Hx of gonorrhea 2017   Stomach pain 06/24/2022    Past Surgical History:  Procedure Laterality Date   CERVICAL BIOPSY      The following portions of the patient's history were reviewed and updated as appropriate: allergies, current medications, past family history, past medical history, past social history, past surgical history and problem list.   Health Maintenance:   Last pap     Component Value Date/Time   DIAGPAP  02/23/2024 1142    - Negative for intraepithelial lesion or malignancy (NILM)   DIAGPAP (A) 12/30/2020 1349    - Atypical squamous cells of undetermined significance (ASC-US )   DIAGPAP  10/06/2019 1143    - Negative for intraepithelial lesion or malignancy (NILM)   HPVHIGH Negative 02/23/2024 1142   HPVHIGH Negative 12/30/2020 1349   HPVHIGH Negative 10/06/2019 1143   ADEQPAP  02/23/2024 1142    Satisfactory for evaluation; transformation zone component ABSENT.   ADEQPAP  12/30/2020 1349    Satisfactory for evaluation; transformation zone component PRESENT.   ADEQPAP  10/06/2019 1143    Satisfactory for evaluation; transformation zone component PRESENT.    Last mammogram: 02/2024 BIRADS 1   Review of Systems:  Pertinent items  are noted in HPI. Comprehensive review of systems was otherwise negative.   Objective:  Physical Exam BP 138/86 (BP Location: Right Arm, Patient Position: Sitting, Cuff Size: Large)   Pulse 94   Wt 209 lb 9.6 oz (95.1 kg)   SpO2 99%   BMI 33.83 kg/m    Physical Exam Vitals and nursing note reviewed. Exam conducted with a chaperone present.  Constitutional:      Appearance: Normal appearance.  HENT:     Head: Normocephalic and atraumatic.   Pulmonary:     Effort: Pulmonary effort is normal.     Breath sounds: Normal breath sounds.  Genitourinary:    General: Normal vulva.     Exam position: Lithotomy position.     Vagina: Normal.     Cervix: Normal.     Comments: White discharge noted  Normal appearing labia majora Feels burning sesnation after palpation with cotton swab   Skin:    General: Skin is warm and dry.   Neurological:     General: No focal deficit present.     Mental Status: She is alert.   Psychiatric:        Mood and Affect: Mood normal.        Behavior: Behavior normal.        Thought Content: Thought content normal.        Judgment: Judgment normal.       Assessment & Plan:   Assessment & Plan Vulvovaginal Atrophy Persistent vaginal burning not relieved by E-string. Estrogen cream ineffective. Considering vaginal tablet due to ring discomfort. Discussed alternative administration methods. Informed consent given for consistent use with understanding of incomplete improvement. - Discontinue E-string. - Initiate vaginal estrogen tablet, administer two to three nights a week. - Reviewed proper insertion technique for vaginal tablet.  Bacterial Vaginosis (BV) Increased vaginal burning and itching suggestive of BV. Differential includes BV, HSV, gonorrhea, and chlamydia. Informed consent for swab testing to confirm diagnosis and rule out other infections. - Perform swab test for BV, HSV, gonorrhea, and chlamydia.  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 Discussed familial tendency and potential need for further evaluation if boils persist  Vaginal Dryness Discussion about using lubricants to alleviate vaginal dryness and burning. Informed consent for temporary relief with lubricants. - If OTC lubricant well tolerated, ok to continue.  Routine preventative health maintenance measures emphasized.  Carter Quarry, MD [facility]  Healthcare, [facility]

## 2024-05-25 LAB — CERVICOVAGINAL ANCILLARY ONLY
Bacterial Vaginitis (gardnerella): NEGATIVE
Candida Glabrata: NEGATIVE
Candida Vaginitis: POSITIVE — AB
Chlamydia: NEGATIVE
Comment: NEGATIVE
Comment: NEGATIVE
Comment: NEGATIVE
Comment: NEGATIVE
Comment: NEGATIVE
Comment: NORMAL
Neisseria Gonorrhea: NEGATIVE
Trichomonas: NEGATIVE

## 2024-05-26 ENCOUNTER — Ambulatory Visit: Payer: Self-pay | Admitting: Obstetrics and Gynecology

## 2024-05-26 ENCOUNTER — Encounter

## 2024-05-26 DIAGNOSIS — B3731 Acute candidiasis of vulva and vagina: Secondary | ICD-10-CM

## 2024-05-26 MED ORDER — FLUCONAZOLE 150 MG PO TABS: 150.0000 mg | ORAL_TABLET | Freq: Once | ORAL | 1 refills | Status: AC

## 2024-05-29 ENCOUNTER — Other Ambulatory Visit: Payer: Self-pay | Admitting: Student

## 2024-05-29 DIAGNOSIS — E119 Type 2 diabetes mellitus without complications: Secondary | ICD-10-CM

## 2024-06-01 ENCOUNTER — Telehealth: Payer: Self-pay

## 2024-06-01 NOTE — Telephone Encounter (Signed)
 Copied from CRM [id number]. Topic: Appointments - Appointment Scheduling >> May 31, 2024 12:05 PM Tanya Austin wrote: After Visit Summary: Patient Return in about 3 months (around 06/12/2024) for DM follow up, obesity .  Patient stated she made an appointment before she left her last appt  Patient also received a call and no message was left.  She would like a call back from the office to discuss this  Please advise.

## 2024-06-01 NOTE — Telephone Encounter (Signed)
 I contacted pt to sch her an appt for 3 month fu visit. Pt is sch for 06/08/2024 @ 8:45 with Dr. Nooruddin.

## 2024-06-07 NOTE — Assessment & Plan Note (Signed)
 Patient presents with obesity with a BMI of . Patient reports ____ adherence to lifestyle changes discussed in previous visit 03/13/24. She is currently taking Ozempic  0.5mg  weekly injection.

## 2024-06-07 NOTE — Progress Notes (Unsigned)
 Patient name: Tanya Austin Date of birth: 1971-10-31 Date of visit: 06/08/24  Type of visit: Established Patient Office Visit   Subjective   Chief concern:  Chief Complaint  Patient presents with   Medical Management of Chronic Issues   Back Pain    Tanya Austin is a 53 y.o. female with a PMHx of T2DM, HLD, and Class II Obesity who [facility] conditions. Last office visit with [facility] [place] was on 03/13/24. Patient is having acute worsening of her lower back pain that she associates with her spinal stenosis. The pain started increasing on Thursday and she denies any new trauma or medications to the area. Does note a new exercise plan M-F in the mornings for 2 months. She follows with neurosurgery who have advised her to try NSAIDs, tylenol , PT, and have given her a steroid injection. MRI done in 2024 consistent with diagnosis. Reports lifting her leg causes increased pain in her back.  Denies bowel/bladder incontinence, changes in sensation.  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  Respiratory:  Negative for cough and shortness of breath.   Cardiovascular:  Negative for chest pain.  Gastrointestinal:  Negative for abdominal pain, constipation and diarrhea.  Musculoskeletal:  Positive for back pain (lower back pain, aching). Negative for myalgias.  Neurological:  Negative for dizziness, tingling, sensory change, weakness and headaches.    Current Outpatient Medications  Medication Instructions   acetaminophen  (TYLENOL ) 1,000 mg, 2 times daily PRN   Cholecalciferol (VITAMIN D3) 3000 units TABS 1 capsule, Daily   cromolyn (OPTICROM) 4 % ophthalmic solution SMARTSIG:In Eye(s)   fexofenadine  (ALLEGRA ) 180 mg, Oral, Daily   fish oil-omega-3 fatty acids 1 g, Daily   fluticasone  (FLONASE ) 50 MCG/ACT nasal spray 1 spray, Each Nare, 2 times daily PRN   gabapentin  (NEURONTIN ) 200 mg, Oral, Daily at bedtime   ketoconazole (NIZORAL) 2 % cream 2 times daily   metformin  (FORTAMET ) 1,000 mg, Oral, Daily with breakfast   RESTASIS 0.05 % ophthalmic emulsion 1 drop, 2 times daily   Semaglutide  (1 MG/DOSE) 1 mg, Subcutaneous, Weekly   simvastatin  (ZOCOR ) 20 mg, Oral, Daily at bedtime   Vagifem  10 mcg, Vaginal, 3 times weekly   valACYclovir  (VALTREX ) 500 MG tablet Take 1 tablet daily with no outbreak. Take 1 tablet BID x 3 days with an outbreak    Social History   Tobacco Use   Smoking status: Former    Current packs/day: 0.00    Types: Cigarettes    Quit date: 06/09/2000  Years since quitting: 24.0   Smokeless tobacco: Never  Vaping Use   Vaping status: Never Used  Substance Use Topics   Alcohol  use: No   Drug use: No    Types: Marijuana      Objective  Today's Vitals   06/08/24 0857  BP: 124/77  Pulse: 95  Temp: 98.2 F (36.8 C)  TempSrc: Oral  SpO2: 96%  Weight: 206 lb 14.4 oz (93.8 kg)  Height: 5' 6 (1.676 m)  Body mass index is 33.39  kg/m.   Physical Exam Constitutional:      General: She is not in acute distress.    Appearance: Normal appearance. She is obese. She is not ill-appearing or diaphoretic.  HENT:     Head: Normocephalic and atraumatic.     Mouth/Throat:     Mouth: Mucous membranes are moist.     Pharynx: Oropharynx is clear.  Eyes:     General: No scleral icterus.    Extraocular Movements: Extraocular movements intact.     Conjunctiva/sclera: Conjunctivae normal.     Pupils: Pupils are equal, round, and reactive to light.  Cardiovascular:     Rate and Rhythm: Normal rate and regular rhythm.     Heart sounds: No murmur heard. Pulmonary:     Effort: Pulmonary effort is normal. No respiratory distress.     Breath sounds: No stridor. No wheezing, rhonchi or rales.  Musculoskeletal:        General: No swelling, tenderness (no point tenderness along lumbar spine), deformity (visually normal appearance of lower back, no skin lesions, lacerations, abrasions. No erythema.) or signs of injury.     Comments: Normal strength. She is able to walk on her toes.   Skin:    Coloration: Skin is not jaundiced or pale.     Findings: No bruising, erythema or rash.  Neurological:     Mental Status: She is alert and oriented to person, place, and time. Mental status is at baseline.     Sensory: No sensory deficit.     Motor: No weakness.     Gait: Gait abnormal (patient walking with slight forward lean).  Psychiatric:        Mood and Affect: Mood normal.        Behavior: Behavior normal.         Assessment & Plan  Problem List Items Addressed This Visit       Endocrine   Type 2 diabetes mellitus without complication, without long-term current use of insulin (HCC)   Today A1c is 7.0%, A1c was last 8.1% on 03/13/24. Current regimen is Metformin  1,000mg  daily and Ozempic  0.5mg  weekly. At the previous visit, increasing metformin  2000mg  daily was discussed, but patient did not want to change her dose. Patient  notes she has been eating healthier and started an exercise plan in the mornings M-F but has had trouble in the past week because of her back pain. Increasing her ozempic  dose may help her to lose more weight helping her pain and control of diabetes if she is unable to exercise as much.  Plan: -A1c completed today -Urine ACR completed today -Continue Metformin  1000mg  daily -Increase Ozempic  from 0.5mg  to 1mg  weekly        Relevant Medications   Semaglutide , 1 MG/DOSE, 4 MG/3ML SOPN   Other Relevant Orders   Microalbumin / creatinine urine ratio   POC Hbg A1C (Completed)     Other   Hyperlipidemia (Chronic)   Previous regimen of simvastatin  20mg   daily, patient reports good adherence but was told to stop taking it for a yeast infection last week. Has restarted since then. Not having any side effects from this.  Plan: -Lipid panel today -Continue simvastatin  20mg       Relevant Orders   Lipid panel   Spinal stenosis of lumbar region with neurogenic claudication - Primary   Patient follows with neurosurgery for her spinal stenosis. Acute worsening of her back pain but without red flag sx (urinary/fecal incontinence, point tenderness, etc.) may be due to muscle strain from increased workout that she started 2 mo ago. MRI in October 2024 showing moderate stenosis in the lumbar region. Is connected with PT for her hip pain but has not completed any sessions yet for her back. Her symptomology is consistent with spinal stenosis (improvement with forward flexion). Discussed with patient risks vs. Benefits of NSAID use. Her kidney function is normal so short term ibuprofen  use may help alleviate her pain. We also discussed that losing weight may help relieve some of her symptoms as well.  Plan: -Follow up with Neurosurgery -Continue PT sessions -Conservative management with alternating NSAIDs/Tylenol  -Counseled about weight loss, will be increasing Ozempic  to 1mg  weekly       Return  in about 3 months (around 09/08/2024) for follow up of DM/HLD.  Patient discussed with Dr. Jeanelle, who also saw and evaluated the patient.  Shirin Echeverry, MD [facility]  PGY-1 06/08/2024, 11:28 AM

## 2024-06-07 NOTE — Assessment & Plan Note (Signed)
 Today A1c is ____, A1c was last 8.1% on 03/13/24. Current regimen is Metformin  1,000mg  daily and Ozempic  0.5mg  weekly. At the previous visit, increasing metformin  2000mg  daily was discussed, but patient did not want to change her dose.    Plan: -A1c completed today -Urine ACR completed today

## 2024-06-08 ENCOUNTER — Ambulatory Visit

## 2024-06-08 ENCOUNTER — Ambulatory Visit: Payer: Self-pay

## 2024-06-08 VITALS — BP 124/77 | HR 95 | Temp 98.2°F | Ht 66.0 in | Wt 206.9 lb

## 2024-06-08 DIAGNOSIS — E66812 Obesity, class 2: Secondary | ICD-10-CM

## 2024-06-08 DIAGNOSIS — M48062 Spinal stenosis, lumbar region with neurogenic claudication: Secondary | ICD-10-CM | POA: Diagnosis present

## 2024-06-08 DIAGNOSIS — Z7984 Long term (current) use of oral hypoglycemic drugs: Secondary | ICD-10-CM | POA: Diagnosis not present

## 2024-06-08 DIAGNOSIS — Z7985 Long-term (current) use of injectable non-insulin antidiabetic drugs: Secondary | ICD-10-CM | POA: Diagnosis not present

## 2024-06-08 DIAGNOSIS — E785 Hyperlipidemia, unspecified: Secondary | ICD-10-CM | POA: Diagnosis not present

## 2024-06-08 DIAGNOSIS — E119 Type 2 diabetes mellitus without complications: Secondary | ICD-10-CM

## 2024-06-08 LAB — POCT GLYCOSYLATED HEMOGLOBIN (HGB A1C): Hemoglobin A1C: 7 % — AB (ref 4.0–5.6)

## 2024-06-08 LAB — GLUCOSE, CAPILLARY: Glucose-Capillary: 194 mg/dL — ABNORMAL HIGH (ref 70–99)

## 2024-06-08 MED ORDER — SEMAGLUTIDE (1 MG/DOSE) 4 MG/3ML ~~LOC~~ SOPN: 1.0000 mg | PEN_INJECTOR | SUBCUTANEOUS | 0 refills | Status: DC

## 2024-06-08 NOTE — Assessment & Plan Note (Addendum)
 Patient follows with neurosurgery for her spinal stenosis. Acute worsening of her back pain but without red flag sx (urinary/fecal incontinence, point tenderness, etc.) may be due to muscle strain from increased workout that she started 2 mo ago. MRI in October 2024 showing moderate stenosis in the lumbar region. Is connected with PT for her hip pain but has not completed any sessions yet for her back. Her symptomology is consistent with spinal stenosis (improvement with forward flexion). Discussed with patient risks vs. Benefits of NSAID use. Her kidney function is normal so short term ibuprofen  use may help alleviate her pain. We also discussed that losing weight may help relieve some of her symptoms as well.  Plan: -Follow up with Neurosurgery -Continue PT sessions -Conservative management with alternating NSAIDs/Tylenol  -Counseled about weight loss, will be increasing Ozempic  to 1mg  weekly

## 2024-06-08 NOTE — Telephone Encounter (Signed)
 FYI Only or Action Required?: Action required by provider: needing ibuprofen  prescription that was discussed during her visit today .  Patient was last seen in primary care on 06/08/2024 by Waymond Cart, MD.  Called Nurse Triage reporting Back Pain.  Symptoms began several days ago.  Interventions attempted: Rest, hydration, or home remedies and Ice/heat application.  Symptoms are: unchanged.  Triage Disposition: See HCP Within 4 Hours (Or PCP Triage)-needing medication sent into her pharmacy.   Patient/caregiver understands and will follow disposition?: No, wishes to speak with PCP  Copied from CRM [id number]. Topic: Clinical - Red Word Triage >> Jun 08, 2024  3:00 PM Carrielelia G wrote: [facility] that prompted transfer to Nurse Triage: severe back pain Reason for Disposition  [1] SEVERE back pain (e.g., excruciating, unable to do any normal activities) AND [2] not improved 2 hours after pain medicine  Answer Assessment - Initial Assessment Questions 1. ONSET: When did the pain begin? (e.g., minutes, hours, days)     Chronic back pain 2. LOCATION: Where does it hurt? (upper, mid or lower back)     Lower back pain 3. SEVERITY: How bad is the pain?  (e.g., Scale 1-10; mild, moderate, or severe)     10 4. PATTERN: Is the pain constant? (e.g., yes, no; constant, intermittent)      constant 5. RADIATION: Does the pain shoot into your legs or somewhere else?     yes 6. CAUSE:  What do you think is causing the back pain?      Possible flare of chronic back pain 7. BACK OVERUSE:  Any recent lifting of heavy objects, strenuous work or exercise?     no 8. MEDICINES: What have you taken so far for the pain? (e.g., nothing, acetaminophen , NSAIDS)     no 9. NEUROLOGIC SYMPTOMS: Do you have any weakness, numbness, or problems with bowel/bladder control?     no 10. OTHER SYMPTOMS: Do you have any other symptoms? (e.g., fever, abdomen pain, burning with urination, blood in  urine)       No  Patient was seen in PCP office today and was told to switch off between tylenol  and ibuprofen . Patient is needing a prescription for ibuprofen  sent to her pharmacy.  Protocols used: Back Pain-A-AH

## 2024-06-08 NOTE — Assessment & Plan Note (Addendum)
 Previous regimen of simvastatin  20mg  daily, patient reports good adherence but was told to stop taking it for a yeast infection last week. Has restarted since then. Not having any side effects from this.  Plan: -Lipid panel today -Continue simvastatin  20mg 

## 2024-06-08 NOTE — Telephone Encounter (Signed)
 Will forward to PCP/Team.

## 2024-06-08 NOTE — Patient Instructions (Signed)
 Thank you, Tanya Austin for allowing us  to provide your care today. Today we discussed the following:  -Your back pain is likely due to your spinal stenosis and perhaps a muscle strain. You can try alternating ibuprofen  and tylenol  for your back pain. Please try to go to your physical therapy for you back. - Losing some weight may help with your back pain. - We are checking your lipid panel. Continue taking your simvastatin . - We are increasing your Ozempic  from 0.5mg  to 1mg  weekly.  I have ordered the following labs for you:   Lab Orders         Microalbumin / creatinine urine ratio         Lipid panel         Glucose, capillary         POC Hbg A1C      Referrals ordered today:   Referral Orders  No referral(s) requested today     I have ordered the following medication/changed the following medications:   Stop the following medications: There are no discontinued medications.   Start the following medications: No orders of the defined types were placed in this encounter.    Follow up: 3 months    Remember: Please call the office if your back pain gets worse or you experience loss of control of your ability to urinate or defecate.  Should you have any questions or concerns please call the Internal Medicine Clinic at [phone].     Rayon Mcchristian, MD [facility]

## 2024-06-09 ENCOUNTER — Ambulatory Visit: Payer: Self-pay

## 2024-06-09 LAB — LIPID PANEL
Chol/HDL Ratio: 4.5 ratio — ABNORMAL HIGH (ref 0.0–4.4)
Cholesterol, Total: 189 mg/dL (ref 100–199)
HDL: 42 mg/dL (ref >39)
LDL Chol Calc (NIH): 118 mg/dL — ABNORMAL HIGH (ref 0–99)
Triglycerides: 161 mg/dL — ABNORMAL HIGH (ref 0–149)
VLDL Cholesterol Cal: 29 mg/dL (ref 5–40)

## 2024-06-09 LAB — MICROALBUMIN / CREATININE URINE RATIO
Creatinine, Urine: 257.7 mg/dL
Microalb/Creat Ratio: 6 mg/g{creat} (ref 0–29)
Microalbumin, Urine: 14.9 ug/mL

## 2024-06-09 MED ORDER — IBUPROFEN 800 MG PO TABS: 800.0000 mg | ORAL_TABLET | Freq: Three times a day (TID) | ORAL | 0 refills | Status: AC | PRN

## 2024-06-09 NOTE — Progress Notes (Signed)
 Internal Medicine Clinic Attending  I was physically present during the key portions of the resident provided service and participated in the medical decision making of patient's management care. I reviewed pertinent patient test results.  The assessment, diagnosis, and plan were formulated together and I agree with the documentation in the resident's note.  Carney Living, MD

## 2024-06-09 NOTE — Addendum Note (Signed)
 Addended by: HEDDY BARREN on: 06/09/2024 12:00 PM   Modules accepted: Orders

## 2024-06-12 NOTE — Telephone Encounter (Signed)
 Call to Pharmacy -Patient picked up her Ibuprofen  on 06/09/2024.   Unable to reach patient.                                                                                                             -

## 2024-06-13 ENCOUNTER — Ambulatory Visit

## 2024-06-13 ENCOUNTER — Other Ambulatory Visit (HOSPITAL_COMMUNITY)
Admission: RE | Admit: 2024-06-13 | Discharge: 2024-06-13 | Disposition: A | Discharge status: Home / Self Care | Source: Ambulatory Visit | Attending: Family Medicine | Admitting: Family Medicine

## 2024-06-13 ENCOUNTER — Other Ambulatory Visit: Payer: Self-pay

## 2024-06-13 VITALS — BP 141/78 | HR 89 | Ht 68.0 in | Wt 211.4 lb

## 2024-06-13 DIAGNOSIS — B9689 Other specified bacterial agents as the cause of diseases classified elsewhere: Secondary | ICD-10-CM | POA: Insufficient documentation

## 2024-06-13 DIAGNOSIS — N760 Acute vaginitis: Secondary | ICD-10-CM | POA: Insufficient documentation

## 2024-06-13 NOTE — Progress Notes (Signed)
 Tanya Austin is here with concern of bacterial vaginosis. These symptoms have been present for 3 days. Patient reports she has tried the following to resolve symptoms: nothing   Pertinent history: Has experienced white, thick vaginal discharge x3 days along with itching. Has not tried anything.  Plan of care: Self swab collected and will reach out via MyChart with any positive results and treatment recommendations.   Self swab instructions given and specimen obtained. Explained patient will be contacted with any abnormal results. Patient is March 2028.   Cyndee JAYSON Molt, RN 06/13/2024  11:56 AM

## 2024-06-14 ENCOUNTER — Ambulatory Visit: Payer: Self-pay | Admitting: Family Medicine

## 2024-06-14 LAB — CERVICOVAGINAL ANCILLARY ONLY
Bacterial Vaginitis (gardnerella): NEGATIVE
Candida Glabrata: NEGATIVE
Candida Vaginitis: NEGATIVE
Chlamydia: NEGATIVE
Comment: NEGATIVE
Comment: NEGATIVE
Comment: NEGATIVE
Comment: NEGATIVE
Comment: NEGATIVE
Comment: NORMAL
Neisseria Gonorrhea: NEGATIVE
Trichomonas: NEGATIVE

## 2024-06-15 ENCOUNTER — Ambulatory Visit: Attending: Obstetrics and Gynecology

## 2024-06-15 DIAGNOSIS — R293 Abnormal posture: Secondary | ICD-10-CM | POA: Insufficient documentation

## 2024-06-15 DIAGNOSIS — M6281 Muscle weakness (generalized): Secondary | ICD-10-CM | POA: Insufficient documentation

## 2024-06-15 DIAGNOSIS — M5459 Other low back pain: Secondary | ICD-10-CM | POA: Diagnosis present

## 2024-06-15 DIAGNOSIS — R252 Cramp and spasm: Secondary | ICD-10-CM | POA: Insufficient documentation

## 2024-06-15 DIAGNOSIS — R262 Difficulty in walking, not elsewhere classified: Secondary | ICD-10-CM | POA: Insufficient documentation
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
## 2024-06-20 ENCOUNTER — Ambulatory Visit

## 2024-06-30 ENCOUNTER — Encounter: Admitting: Physical Therapy

## 2024-07-04 ENCOUNTER — Encounter

## 2024-07-07 ENCOUNTER — Ambulatory Visit (HOSPITAL_BASED_OUTPATIENT_CLINIC_OR_DEPARTMENT_OTHER): Admitting: Physical Therapy

## 2024-07-11 ENCOUNTER — Ambulatory Visit

## 2024-07-11 ENCOUNTER — Ambulatory Visit (HOSPITAL_BASED_OUTPATIENT_CLINIC_OR_DEPARTMENT_OTHER): Admitting: Physical Therapy

## 2024-07-13 ENCOUNTER — Ambulatory Visit: Admitting: *Deleted

## 2024-07-13 ENCOUNTER — Ambulatory Visit: Attending: Obstetrics and Gynecology | Admitting: Physical Therapy

## 2024-07-13 ENCOUNTER — Encounter: Payer: Self-pay | Admitting: Physical Therapy

## 2024-07-13 DIAGNOSIS — M62830 Muscle spasm of back: Secondary | ICD-10-CM | POA: Insufficient documentation

## 2024-07-13 DIAGNOSIS — R279 Unspecified lack of coordination: Secondary | ICD-10-CM | POA: Insufficient documentation

## 2024-07-13 DIAGNOSIS — R293 Abnormal posture: Secondary | ICD-10-CM | POA: Diagnosis present

## 2024-07-13 DIAGNOSIS — M62838 Other muscle spasm: Secondary | ICD-10-CM | POA: Insufficient documentation

## 2024-07-13 DIAGNOSIS — M5459 Other low back pain: Secondary | ICD-10-CM | POA: Insufficient documentation

## 2024-07-13 DIAGNOSIS — R252 Cramp and spasm: Secondary | ICD-10-CM | POA: Diagnosis present

## 2024-07-13 DIAGNOSIS — R262 Difficulty in walking, not elsewhere classified: Secondary | ICD-10-CM | POA: Insufficient documentation

## 2024-07-13 DIAGNOSIS — Z3202 Encounter for pregnancy test, result negative: Secondary | ICD-10-CM | POA: Diagnosis not present

## 2024-07-13 DIAGNOSIS — M6281 Muscle weakness (generalized): Secondary | ICD-10-CM | POA: Diagnosis present

## 2024-07-13 DIAGNOSIS — Z3200 Encounter for pregnancy test, result unknown: Secondary | ICD-10-CM

## 2024-07-13 LAB — POCT PREGNANCY, URINE: Preg Test, Ur: NEGATIVE

## 2024-07-13 NOTE — Progress Notes (Addendum)
 Possible Pregnancy  Here today for pregnancy confirmation. UPT in office today is negative. Patient dropped off urine.  Verified  patient identity using two identifiers over the phone.  Pt reports that she has not tested at home, but had intercourse and was worried it could be positive.  Patient reports LMP was around 06/12/2024, but she cannot remember the exact day.  Medication list updated and allergies/pharmacy reviewed.  Patient is scheduled to see Dr. Jeralyn on 07/24/24.    Rosina MORTON RN BSN
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
## 2024-07-18 ENCOUNTER — Encounter

## 2024-07-18 ENCOUNTER — Ambulatory Visit (HOSPITAL_BASED_OUTPATIENT_CLINIC_OR_DEPARTMENT_OTHER): Admitting: Physical Therapy

## 2024-07-20 ENCOUNTER — Encounter: Admitting: Physical Therapy

## 2024-07-24 ENCOUNTER — Telehealth: Payer: Self-pay | Admitting: *Deleted

## 2024-07-24 ENCOUNTER — Ambulatory Visit: Admitting: Obstetrics and Gynecology

## 2024-07-24 NOTE — Telephone Encounter (Signed)
 Return call from pt asking if I had heard from the doctor; I told her not yet. Pt is very anxious, wanting to know what caused the hives. Stated she took the Ozempic  this am; she denied sob, rash, hives after the injection. Stated she has pictures; I asked her to send via My Chart; stated she will.

## 2024-07-24 NOTE — Telephone Encounter (Signed)
 Copied from CRM #[id number]. Topic: Clinical - Medication Question >> Jul 21, 2024  8:54 AM Miquel SAILOR wrote: Reason for CRM: Semaglutide ,0.25 or 0.5MG /DOS, 2 MG/3ML NELMA [[id number]]  DISCONTINUED-Patient stated it is Ozempic  but not on list that needle broke. Needs call back [phone]

## 2024-07-24 NOTE — Telephone Encounter (Signed)
 Called pt to schedule an appt. Pt told front office unable to come Wednesday.but Friday. Appt schedule w/ Dr Harrie 8/29 @ 0915Am.

## 2024-07-24 NOTE — Telephone Encounter (Signed)
 Return call to pt. Pt stated she broke out in hives which has dried up. Stated she expected a call back  on Friday - informed pt the office was closed. Stated the hives started 2 week after Ozempic  was increased to 1.0 mg. The hives were on her arms and near the armpits. She want to know if it's related to Ozempic  or something else?  I told her it's possible from the Ozempic  but I will inform the doctor.

## 2024-07-25 ENCOUNTER — Encounter

## 2024-07-26 ENCOUNTER — Ambulatory Visit: Admitting: Physical Therapy

## 2024-07-27 ENCOUNTER — Encounter: Admitting: Physical Therapy

## 2024-07-28 ENCOUNTER — Ambulatory Visit (INDEPENDENT_AMBULATORY_CARE_PROVIDER_SITE_OTHER): Admitting: Student

## 2024-07-28 ENCOUNTER — Other Ambulatory Visit: Payer: Self-pay

## 2024-07-28 ENCOUNTER — Encounter: Payer: Self-pay | Admitting: Student

## 2024-07-28 VITALS — BP 129/81 | HR 80 | Temp 98.1°F | Ht 68.0 in | Wt 204.4 lb

## 2024-07-28 DIAGNOSIS — K5900 Constipation, unspecified: Secondary | ICD-10-CM

## 2024-07-28 DIAGNOSIS — R21 Rash and other nonspecific skin eruption: Secondary | ICD-10-CM | POA: Diagnosis not present

## 2024-07-28 MED ORDER — SENNOSIDES-DOCUSATE SODIUM 8.6-50 MG PO TABS: 1.0000 | ORAL_TABLET | Freq: Every evening | ORAL | 1 refills | Status: AC | PRN

## 2024-07-28 NOTE — Assessment & Plan Note (Signed)
 Acute evaluation of a rash that arose last week.  Since that has resolved, she was able to show me some pictures.  Notable for 3-4 scattered wheals in the right and left underarms and right elbow.  Insidious onset last week, itchy, some burning, no drainage, resolved on its own after about 1 day.  Overall most consistent with bug bite.  Location in the armpits could be similar to hidradenitis but this is not a recurrent issue so very low suspicion.  Overall reassured that this is not anything dangerous and she can confidently move forward in that knowledge.  She will let us  know if this becomes a recurrent issue.

## 2024-07-28 NOTE — Assessment & Plan Note (Signed)
 Notes that constipation started after she turned 51.  She is now 84.  Bowel movements every 4 days.  Hard will at times, no nausea, vomiting. - Senokot daily as needed

## 2024-07-28 NOTE — Progress Notes (Signed)
   CC: rash  HPI:  [name] is a 53 y.o. female with a PMH stated below who presents today for an acute evaluation of a rash.  Please see problem based assessment and plan for additional details.  Past Medical History:  Diagnosis Date   Diabetes mellitus    Dizziness 01/06/2022   Ear pain, right 06/24/2022   Fibroid    Headache    Herpes    High cholesterol    History of COVID-19 09/10/2020   Hx of gonorrhea 2017   Stomach pain 06/24/2022   Review of Systems: ROS negative except for what is noted on the assessment and plan.  Vitals:   07/28/24 0928  BP: 129/81  Pulse: 80  Temp: 98.1 F (36.7 C)  TempSrc: Oral  SpO2: 98%  Weight: 204 lb 6.4 oz (92.7 kg)  Height: 5' 8 (1.727 m)   Physical Exam: Constitutional: well-appearing woman in no acute distress Cardiovascular: regular rate and rhythm, no m/r/g Pulmonary/Chest: normal work of breathing on room air, lungs clear to auscultation bilaterally Abdominal: soft, non-tender, non-distended, bowel sounds present Skin: warm and dry.  Rash is no longer present, but there is some sequela mild excoriation from scratching.  Picture of her rash resembles small, round wheals, raised, red, dime sized, in the R armpit. Psych: normal mood and behavior  Assessment & Plan:   Patient discussed with Dr. Karna  Rash Acute evaluation of a rash that arose last week.  Since that has resolved, she was able to show me some pictures.  Notable for 3-4 scattered wheals in the right and left underarms and right elbow.  Insidious onset last week, itchy, some burning, no drainage, resolved on its own after about 1 day.  Overall most consistent with bug bite.  Location in the armpits could be similar to hidradenitis but this is not a recurrent issue so very low suspicion.  Overall reassured that this is not anything dangerous and she can confidently move forward in that knowledge.  She will let us  know if this becomes a recurrent  issue.  Constipation Notes that constipation started after she turned 51.  She is now 18.  Bowel movements every 4 days.  Hard will at times, no nausea, vomiting. - Senokot daily as needed  RTC for regularly scheduled office visit in October.  Tanya Austin, D.O. [facility] Internal [facility], PGY-2 Phone: [phone] Date 07/28/2024 Time 11:44 AM

## 2024-07-28 NOTE — Patient Instructions (Signed)
 Senokot will help relieve your constipation. Take it every evening as needed. If you remain constipated, start taking miralax in the morning as well. You can purchase miralax over the counter.

## 2024-08-01 ENCOUNTER — Ambulatory Visit (HOSPITAL_BASED_OUTPATIENT_CLINIC_OR_DEPARTMENT_OTHER): Admitting: Physical Therapy

## 2024-08-01 ENCOUNTER — Other Ambulatory Visit: Payer: Self-pay | Admitting: Student

## 2024-08-01 ENCOUNTER — Encounter

## 2024-08-01 DIAGNOSIS — E119 Type 2 diabetes mellitus without complications: Secondary | ICD-10-CM

## 2024-08-01 NOTE — Telephone Encounter (Signed)
 Medication sent to pharmacy

## 2024-08-01 NOTE — Progress Notes (Signed)
 Internal Medicine Clinic Attending  Case discussed with the resident at the time of the visit.  We reviewed the resident's history and exam and pertinent patient test results.  I agree with the assessment, diagnosis, and plan of care documented in the resident's note.  Colonoscopy noted as complete in 2022 at [facility]. I do not see records, recommend requesting at next visit for review.

## 2024-08-08 ENCOUNTER — Ambulatory Visit (HOSPITAL_BASED_OUTPATIENT_CLINIC_OR_DEPARTMENT_OTHER): Admitting: Physical Therapy

## 2024-08-08 ENCOUNTER — Encounter

## 2024-08-09 ENCOUNTER — Ambulatory Visit (INDEPENDENT_AMBULATORY_CARE_PROVIDER_SITE_OTHER): Admitting: Dermatology

## 2024-08-09 ENCOUNTER — Encounter: Payer: Self-pay | Admitting: Dermatology

## 2024-08-09 VITALS — BP 114/73 | HR 78

## 2024-08-09 DIAGNOSIS — L409 Psoriasis, unspecified: Secondary | ICD-10-CM

## 2024-08-09 DIAGNOSIS — L219 Seborrheic dermatitis, unspecified: Secondary | ICD-10-CM

## 2024-08-09 DIAGNOSIS — L509 Urticaria, unspecified: Secondary | ICD-10-CM | POA: Diagnosis not present

## 2024-08-09 DIAGNOSIS — L816 Other disorders of diminished melanin formation: Secondary | ICD-10-CM | POA: Diagnosis not present

## 2024-08-09 DIAGNOSIS — M5416 Radiculopathy, lumbar region: Secondary | ICD-10-CM | POA: Insufficient documentation

## 2024-08-09 DIAGNOSIS — W57XXXA Bitten or stung by nonvenomous insect and other nonvenomous arthropods, initial encounter: Secondary | ICD-10-CM

## 2024-08-09 DIAGNOSIS — M47816 Spondylosis without myelopathy or radiculopathy, lumbar region: Secondary | ICD-10-CM | POA: Insufficient documentation

## 2024-08-09 MED ORDER — TRIAMCINOLONE ACETONIDE 0.1 % EX OINT: 1.0000 | TOPICAL_OINTMENT | Freq: Two times a day (BID) | CUTANEOUS | 4 refills | Status: AC

## 2024-08-09 MED ORDER — CLOBETASOL PROPIONATE 0.05 % EX SOLN: 1.0000 | CUTANEOUS | 9 refills | Status: AC

## 2024-08-09 NOTE — Patient Instructions (Addendum)
 Date: Wed Aug 09 2024  Hello [place],  Thank you for visiting today. Here is a summary of the key instructions:  - Medications:   - Use clobetasol  on itchy or flaky areas of scalp as needed   - Use triamcinolone  cream for bug bites as soon as possible after getting a bite   - Consider taking an antihistamine daily like Zyrtec  or Claritin  daily if bug bites persist, this should help  - Scalp Care:   - Wash hair every 2-3 weeks, not longer than 4 weeks   - Use DHS zinc shampoo (2% zinc) - let it sit for 3 minutes before rinsing   - Follow with regular hydrating shampoo and conditioner   - Do not apply oil directly to scalp   - Try Carol's Daughter scalp treatment for braids or locks  - Other Instructions:   - Avoid using coconut oil or olive oil on scalp  - Follow-up:   - Return for follow-up appointment in 4 months  Please reach out if you have any questions or concerns.  Warm regards,  Dr. Delon [facility]       Important Information   Due to recent changes in healthcare laws, you may see results of your pathology and/or laboratory studies on MyChart before the doctors have had a chance to review them. We understand that in some cases there may be results that are confusing or concerning to you. Please understand that not all results are received at the same time and often the doctors may need to interpret multiple results in order to provide you with the best plan of care or course of treatment. Therefore, we ask that you please give us  2 business days to thoroughly review all your results before contacting the office for clarification. Should we see a critical lab result, you will be contacted sooner.     If You Need Anything After Your Visit   If you have any questions or concerns for your doctor, please call our main line at [phone]. If no one answers, please leave a voicemail as directed and we will return your call as soon as possible. Messages left after  4 pm will be answered the following business day.    You may also send us  a message via MyChart. We typically respond to MyChart messages within 1-2 business days.  For prescription refills, please ask your pharmacy to contact our office. Our fax number is [phone].  If you have an urgent issue when the clinic is closed that cannot wait until the next business day, you can page your doctor at the number below.     Please note that while we do our best to be available for urgent issues outside of office hours, we are not available 24/7.    If you have an urgent issue and are unable to reach us , you may choose to seek medical care at your doctor's office, retail clinic, urgent care center, or emergency room.   If you have a medical emergency, please immediately call 911 or go to the emergency department. In the event of inclement weather, please call our main line at [phone] for an update on the status of any delays or closures.  Dermatology Medication Tips: Please keep the boxes that topical medications come in in order to help keep track of the instructions about where and how to use these. Pharmacies typically print the medication instructions only on the boxes and not directly on the medication tubes.  If your medication is too expensive, please contact our office at [phone] or send us  a message through MyChart.    We are unable to tell what your co-pay for medications will be in advance as this is different depending on your insurance coverage. However, we may be able to find a substitute medication at lower cost or fill out paperwork to get insurance to cover a needed medication.    If a prior authorization is required to get your medication covered by your insurance company, please allow us  1-2 business days to complete this process.   Drug prices often vary depending on where the prescription is filled and some pharmacies may offer cheaper prices.   The website  www.goodrx.com contains coupons for medications through different pharmacies. The prices here do not account for what the cost may be with help from insurance (it may be cheaper with your insurance), but the website can give you the price if you did not use any insurance.  - You can print the associated coupon and take it with your prescription to the pharmacy.  - You may also stop by our office during regular business hours and pick up a GoodRx coupon card.  - If you need your prescription sent electronically to a different pharmacy, notify our office through [facility] or by phone at [phone]

## 2024-08-09 NOTE — Progress Notes (Signed)
 New Patient Visit   Subjective  Tanya Austin is a 53 y.o. female who presents for the following: Psoriasis  Patient states she has Psoriasis located at the scalp that she would like to have examined. Patient reports the areas have been there for 30 years. She reports the areas are bothersome.Patient rates the scalp is very itchy. Patient rates irritation 10 out of 10. Patient reports she has previously been treated for these areas. She was seen by a dermatologist in [place] several years ago and was prescribed a shampoo. She is unsure of the name but the shampoo did not help with her sxs.  Patient denies Hx of bx. Patient denies family history of psoriasis. Patient denies joint pains associated with Psoriasis. Patient reports she is currently washing once a month.   The following portions of the chart were reviewed this encounter and updated as appropriate: medications, allergies, medical history  Review of Systems:  No other skin or systemic complaints except as noted in HPI or Assessment and Plan.  Objective  Well appearing patient in no apparent distress; mood and affect are within normal limits.  A focused examination was performed of the following areas: Scalp  Relevant exam findings are noted in the Assessment and Plan.    Assessment & Plan   1. Seborrheic Dermatitis of the Scalp - Assessment: Patient reports severe scalp itching (10/10 intensity) with flaking. Current hair washing frequency is approximately once per month, which is likely contributing to symptom exacerbation. Condition attributed to overgrowth of Malassezia yeast on the scalp, which thrives on excess oil accumulation. Seasonal changes, particularly fall and winter, noted to trigger flares. - Plan:    Recommend washing hair every 2 weeks, maximum 3 weeks    Use DHS zinc shampoo 2% (available over-the-counter): apply and let sit for 3 minutes before rinsing, follow with regular hydrating shampoo and  conditioner    Prescribe clobetasol  solution for scalp application to itchy or flaky areas as needed    Avoid applying oils directly to scalp, especially coconut and olive oils    Consider Carol's Daughter scalp treatment for braids/locks as clarifying solution between washes    Patient educated on chronic nature of condition and potential for flares  2. Urticaria - Assessment: Patient reports recent hive that has resolved. Hives defined as lasting less than 24 hours, distinguishing them from insect bites which typically persist for 3-4 days. 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 Condition attributed to natural aging process of pigment cells in brown skin. - Plan:    Educate patient on benign nature of condition    Recommend sun protection measures: regular use of sunscreen, especially during summer months    Protective clothing to prevent further pigment cell damage  Follow-up in 4 months.  PSORIASIS   Related Medications clobetasol  (TEMOVATE ) 0.05 % external solution Apply 1 Application topically as directed. Apply to the areas on the scalp that are excessively itchy 2-3 times daily PRN BUG BITE WITHOUT INFECTION, INITIAL ENCOUNTER   Related Medications triamcinolone  ointment (KENALOG ) 0.1 % Apply 1  Application topically 2 (two) times daily. Apply 2 times daily for 2 weeks then STOP  Return in about 5 months (around 01/09/2025) for Psoriasis F/U.  I, Jetta Ager, am acting as Neurosurgeon for [employer], DO.  Documentation: I  have reviewed the above documentation for accuracy and completeness, and I agree with the above.  Delon Lenis, DO

## 2024-08-15 ENCOUNTER — Encounter

## 2024-08-17 ENCOUNTER — Encounter: Admitting: Physical Therapy

## 2024-08-19 ENCOUNTER — Encounter (HOSPITAL_COMMUNITY): Payer: Self-pay

## 2024-08-19 ENCOUNTER — Ambulatory Visit (HOSPITAL_COMMUNITY)
Admission: EM | Admit: 2024-08-19 | Discharge: 2024-08-19 | Disposition: A | Discharge status: Home / Self Care | Attending: Internal Medicine | Admitting: Internal Medicine

## 2024-08-19 DIAGNOSIS — R110 Nausea: Secondary | ICD-10-CM | POA: Insufficient documentation

## 2024-08-19 DIAGNOSIS — Z3202 Encounter for pregnancy test, result negative: Secondary | ICD-10-CM | POA: Diagnosis not present

## 2024-08-19 DIAGNOSIS — Z113 Encounter for screening for infections with a predominantly sexual mode of transmission: Secondary | ICD-10-CM | POA: Insufficient documentation

## 2024-08-19 DIAGNOSIS — K5909 Other constipation: Secondary | ICD-10-CM | POA: Diagnosis present

## 2024-08-19 DIAGNOSIS — R1030 Lower abdominal pain, unspecified: Secondary | ICD-10-CM | POA: Diagnosis present

## 2024-08-19 LAB — POCT URINALYSIS DIP (MANUAL ENTRY)
Bilirubin, UA: NEGATIVE
Blood, UA: NEGATIVE
Glucose, UA: NEGATIVE mg/dL
Leukocytes, UA: NEGATIVE
Nitrite, UA: NEGATIVE
Protein Ur, POC: NEGATIVE mg/dL
Spec Grav, UA: 1.025 (ref 1.010–1.025)
Urobilinogen, UA: 0.2 U/dL
pH, UA: 5.5 (ref 5.0–8.0)

## 2024-08-19 LAB — POCT URINE PREGNANCY: Preg Test, Ur: NEGATIVE

## 2024-08-19 MED ORDER — ONDANSETRON 4 MG PO TBDP
ORAL_TABLET | ORAL | Status: AC
Start: 2024-08-19 — End: 2024-08-19
  Filled 2024-08-19: qty 1

## 2024-08-19 MED ORDER — ONDANSETRON 4 MG PO TBDP
4.0000 mg | ORAL_TABLET | Freq: Once | ORAL | Status: AC
  Administered 2024-08-19: 4 mg via ORAL

## 2024-08-19 MED ORDER — ONDANSETRON 4 MG PO TBDP: 4.0000 mg | ORAL_TABLET | Freq: Three times a day (TID) | ORAL | 0 refills | Status: AC | PRN

## 2024-08-19 NOTE — ED Provider Notes (Signed)
 [facility]    CSN: [id number] Arrival date & time: 08/19/24  1002      History   Chief Complaint Chief Complaint  Patient presents with   Abdominal Pain   Headache   Diarrhea   Nausea    HPI Tanya Austin is a 53 y.o. female.   53 year old female presents urgent care with complaints of nausea, headache, lower abdominal pain and constipation.  She reports this been going on for about 6 days.  She has had a poor appetite secondary to the nausea but is trying to stay hydrated.  She did have some diarrhea early on but has not had a bowel movement now in about 3 to 4 days.  She denies any fevers or chills.  She reports that her period has not come yet and wanted to pregnancy test.  She would also like to have STI testing done today.   Abdominal Pain Associated symptoms: constipation (3-4 days without BM per patient) and nausea   Associated symptoms: no chest pain, no chills, no cough, no dysuria, no fever, no hematuria, no shortness of breath, no sore throat and no vomiting   Headache Associated symptoms: abdominal pain and nausea   Associated symptoms: no back pain, no cough, no ear pain, no eye pain, no fever, no seizures, no sore throat and no vomiting   Diarrhea Associated symptoms: abdominal pain and headaches   Associated symptoms: no arthralgias, no chills, no fever and no vomiting     Past Medical History:  Diagnosis Date   Diabetes mellitus    Dizziness 01/06/2022   Ear pain, right 06/24/2022   Fibroid    Headache    Herpes    High cholesterol    History of COVID-19 09/10/2020   Hx of gonorrhea 2017   Stomach pain 06/24/2022    Patient Active Problem List   Diagnosis Date Noted   Lumbar radiculopathy 08/09/2024   Lumbar spondylosis 08/09/2024   Rash 07/28/2024   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   Diabetic neuropathy (HCC) 11/11/2023   Vaginal bleeding 10/09/2023   Obesity due to excess calories with  serious comorbidity 06/15/2023   Esophageal dysphagia 06/15/2023   Constipation 06/15/2023   Vaginal atrophy 06/15/2023   Hematuria 05/12/2023   Dysuria 05/04/2023   Pruritic dermatitis 04/28/2023   Bilateral sciatica 03/30/2022   Tendinopathy of right rotator cuff 01/20/2022   Grieving 01/06/2022   Anxiety 12/29/2021   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 Authorizing Provider  ondansetron  (ZOFRAN -ODT) 4 MG disintegrating tablet Take 1 tablet (4 mg total) by mouth every 8 (eight) hours as needed  for nausea or vomiting. 08/19/24  Yes Graclyn Lawther A, PA-C  acetaminophen  (TYLENOL ) 500 MG tablet Take 1,000 mg by mouth 2 (two) times daily as needed for moderate pain.    [provider]  Cholecalciferol (VITAMIN D3) 3000 units TABS Take 1 capsule by mouth daily.     [provider]  clobetasol  (TEMOVATE ) 0.05 % external solution Apply 1 Application topically as directed. Apply to the areas on the scalp that are excessively itchy 2-3 times daily PRN 08/09/24   Alm Delon SAILOR, DO  cromolyn (OPTICROM) 4 % ophthalmic solution SMARTSIG:In Eye(s) 11/05/23   [provider]  ESTRING  7.5 MCG/24HR vaginal ring SMARTSIG:1 ring Vaginal Every 3 Months 07/29/24   [provider]  fish oil-omega-3 fatty acids 1000 MG capsule Take 1 g by mouth daily.     [provider]  gabapentin  (NEURONTIN ) 100 MG capsule Take 2 capsules (200 mg total) by mouth at bedtime. 11/11/23   Harrie Bruckner, DO  ibuprofen  (ADVIL ) 800 MG tablet Take 1 tablet (800 mg total) by mouth every 8 (eight) hours as needed. 06/09/24   Tawkaliyar, Roya, DO  ketoconazole (NIZORAL) 2 % cream Apply topically 2 (two) times daily. 10/11/23   [provider]  metformin  (FORTAMET ) 1000 MG (OSM) 24 hr tablet Take 1 tablet (1,000 mg total) by mouth daily with breakfast. 05/08/24   Kandis Perkins, DO  RESTASIS 0.05 % ophthalmic emulsion 1 drop 2 (two) times daily. 09/05/20   [provider]  Semaglutide , 1 MG/DOSE, 4 MG/3ML SOPN Inject 1 mg into the skin once a week. 06/08/24   Chambliss, Layman CROME, MD  senna-docusate (SENOKOT-S) 8.6-50 MG tablet Take 1 tablet by mouth at bedtime as needed for mild constipation. 07/28/24   Harrie Bruckner, DO  simvastatin  (ZOCOR ) 20 MG tablet TAKE 1 TABLET BY MOUTH AT BEDTIME 04/04/24   Kandis Perkins, DO  triamcinolone  ointment (KENALOG ) 0.1 % Apply 1 Application topically 2 (two) times daily. Apply 2 times daily for 2 weeks then STOP 08/09/24   Alm Delon SAILOR, DO  valACYclovir  (VALTREX ) 500 MG tablet Take 1 tablet daily with no outbreak. Take 1 tablet BID x 3 days with an outbreak 02/08/24   Kandis Perkins, DO    Family History Family History  Problem Relation Age of Onset   Hypertension Mother    Diabetes Mother    Hypertension Father    Hypertension Other    Hyperlipidemia Other    Heart disease Other    Breast cancer Neg Hx    BRCA 1/2 Neg Hx     Social History Social History   Tobacco Use   Smoking status: Former    Current packs/day: 0.00    Types: Cigarettes    Quit date: 06/09/2000    Years since quitting: 24.2    Passive exposure: Never   Smokeless tobacco: Never  Vaping Use   Vaping status: Never Used  Substance Use Topics   Alcohol  use: No   Drug use: Not Currently    Types: Marijuana      Allergies   Patient has no known allergies.   Review of Systems Review of Systems  Constitutional:  Positive for appetite change. Negative for chills and fever.  HENT:  Negative for ear pain and sore throat.   Eyes:  Negative for pain and visual disturbance.  Respiratory:  Negative for cough and shortness of breath.   Cardiovascular:  Negative for chest pain and palpitations.  Gastrointestinal:  Positive for abdominal pain, constipation (  3-4 days without BM per patient) and nausea. Negative for vomiting.  Genitourinary:  Negative for dysuria and hematuria.  Musculoskeletal:  Negative for arthralgias and back pain.  Skin:  Negative for color change and rash.  Neurological:  Positive for headaches. Negative for seizures and syncope.  All other systems reviewed and are negative.    Physical Exam Triage Vital Signs ED Triage Vitals [08/19/24 1016]  Encounter Vitals Group     BP 122/84     Girls Systolic BP Percentile      Girls Diastolic BP Percentile      Boys Systolic BP Percentile      Boys Diastolic BP Percentile      Pulse Rate 89     Resp 16     Temp 98.4 F (36.9 C)     Temp Source Oral     SpO2 97 %     Weight      Height      Head Circumference      Peak Flow      Pain Score 0     Pain Loc      Pain Education      Exclude from Growth Chart    No data found.  Updated Vital Signs BP 122/84 (BP Location: Right Arm)   Pulse 89   Temp 98.4 F (36.9 C) (Oral)   Resp 16   LMP 07/13/2024 (Approximate)   SpO2 97%   Visual Acuity Right Eye Distance:   Left Eye Distance:   Bilateral Distance:    Right Eye Near:   Left Eye Near:    Bilateral Near:     Physical Exam Vitals and nursing note reviewed.  Constitutional:      General: She is not in acute distress.    Appearance: She is well-developed.  HENT:     Head: Normocephalic and atraumatic.  Eyes:     Conjunctiva/sclera: Conjunctivae normal.  Cardiovascular:     Rate and Rhythm: Normal rate  and regular rhythm.     Heart sounds: No murmur heard. Pulmonary:     Effort: Pulmonary effort is normal. No respiratory distress.     Breath sounds: Normal breath sounds.  Abdominal:     General: Bowel sounds are normal. There is no distension.     Palpations: Abdomen is soft. There is no shifting dullness.     Tenderness: There is abdominal tenderness in the right lower quadrant, suprapubic area and left lower quadrant. There is no right CVA tenderness, left CVA tenderness, guarding or rebound. Negative signs include Murphy's sign and McBurney's sign.  Musculoskeletal:        General: No swelling.     Cervical back: Neck supple.  Skin:    General: Skin is warm and dry.     Capillary Refill: Capillary refill takes less than 2 seconds.  Neurological:     Mental Status: She is alert.  Psychiatric:        Mood and Affect: Mood normal.      UC Treatments / Results  Labs (all labs ordered are listed, but only abnormal results are displayed) Labs Reviewed  POCT URINALYSIS DIP (MANUAL ENTRY) - Abnormal; Notable for the following components:      Result Value   Clarity, UA cloudy (*)    Ketones, POC UA trace (5) (*)    All other components within normal limits  POCT URINE PREGNANCY  CERVICOVAGINAL ANCILLARY ONLY    EKG   Radiology No results found.  Procedures Procedures (including critical care time)  Medications Ordered in UC Medications  ondansetron  (ZOFRAN -ODT) disintegrating tablet 4 mg (has no administration in time range)    Initial Impression / Assessment and Plan / UC Course  I have reviewed the triage vital signs and the nursing notes.  Pertinent labs & imaging results that were available during my care of the patient were reviewed by me and considered in my medical decision making (see chart for details).     Nausea  Lower abdominal pain  Other constipation   Urinalysis today does not show an infectious process.  Pregnancy test done today was  negative.  Nausea, lower abdominal pain and constipation.  Physical exam findings and vital signs are reassuring.  Symptoms could be related to a viral illness or the constipation.  Can try medication to help with constipation as well as nausea and if symptoms fail to improve or worsen may need to consider following up at the emergency room.  Offered blood work today but patient declines.  Would schedule an appointment to see your primary care provider to discuss irregular menses. Zofran  4 mg orally disintegrating tablet every 8 hours as needed for nausea.  First dose given here at around 11 AM. MiraLAX 1 scoop twice daily until bowel movements are more regular then may use once daily or as needed Screening swab done today and results will be available in 24-48 hours. We will contact you if we need to arrange additional treatment based on your testing. Negative results will be on your MyChart account.  Make sure to stay hydrated by drinking plenty of water. Return to urgent care or PCP if symptoms worsen or fail to resolve.    Final Clinical Impressions(s) / UC Diagnoses   Final diagnoses:  Nausea  Lower abdominal pain  Other constipation     Discharge Instructions      Urinalysis today does not show an infectious process.  Pregnancy test done today was negative.  Nausea, lower abdominal pain and constipation.  Physical exam findings and vital signs are reassuring.  Symptoms could be related to a viral illness or the constipation.  Can try medication to help with constipation as well as nausea and if symptoms fail to improve or worsen may need to consider following up at the emergency room.  Would schedule an appointment to see your primary care provider to discuss irregular menses. Zofran  4 mg orally disintegrating tablet every 8 hours as needed for nausea.  First dose given here at around 11 AM. MiraLAX 1 scoop twice daily until bowel movements are more regular then may use once daily or as  needed Make sure to stay hydrated by drinking plenty of water. Screening swab done today and results will be available in 24-48 hours. We will contact you if we need to arrange additional treatment based on your testing. Negative results will be on your MyChart account.  Return to urgent care or PCP if symptoms worsen or fail to resolve.      ED Prescriptions     Medication Sig Dispense Auth. Provider   ondansetron  (ZOFRAN -ODT) 4 MG disintegrating tablet Take 1 tablet (4 mg total) by mouth every 8 (eight) hours as needed for nausea or vomiting. 20 tablet Teresa Almarie LABOR, [place]      PDMP not reviewed this encounter.   Teresa Almarie LABOR, PA-C 08/19/24 1055

## 2024-08-19 NOTE — ED Triage Notes (Signed)
 Patient c/o headache, nausea, fatigue, abdominal pain, and diarrhea (x 1 episode)  x 6 days.  Patient states she wants a pregnancy test and to be tested for BV.  Patient states she has taken Tylenol  for her symptoms.

## 2024-08-19 NOTE — Discharge Instructions (Addendum)
 Urinalysis today does not show an infectious process.  Pregnancy test done today was negative.  Nausea, lower abdominal pain and constipation.  Physical exam findings and vital signs are reassuring.  Symptoms could be related to a viral illness or the constipation.  Can try medication to help with constipation as well as nausea and if symptoms fail to improve or worsen may need to consider following up at the emergency room.  Would schedule an appointment to see your primary care provider to discuss irregular menses. Zofran  4 mg orally disintegrating tablet every 8 hours as needed for nausea.  First dose given here at around 11 AM. MiraLAX 1 scoop twice daily until bowel movements are more regular then may use once daily or as needed Make sure to stay hydrated by drinking plenty of water. Screening swab done today and results will be available in 24-48 hours. We will contact you if we need to arrange additional treatment based on your testing. Negative results will be on your MyChart account.  Return to urgent care or PCP if symptoms worsen or fail to resolve.

## 2024-08-21 ENCOUNTER — Ambulatory Visit (HOSPITAL_COMMUNITY): Payer: Self-pay

## 2024-08-21 LAB — CERVICOVAGINAL ANCILLARY ONLY
Bacterial Vaginitis (gardnerella): NEGATIVE
Candida Glabrata: NEGATIVE
Candida Vaginitis: POSITIVE — AB
Chlamydia: NEGATIVE
Comment: NEGATIVE
Comment: NEGATIVE
Comment: NEGATIVE
Comment: NEGATIVE
Comment: NEGATIVE
Comment: NORMAL
Neisseria Gonorrhea: NEGATIVE
Trichomonas: NEGATIVE

## 2024-08-21 MED ORDER — FLUCONAZOLE 150 MG PO TABS: 150.0000 mg | ORAL_TABLET | Freq: Once | ORAL | 0 refills | Status: AC

## 2024-08-22 ENCOUNTER — Encounter

## 2024-08-24 ENCOUNTER — Encounter: Admitting: Physical Therapy

## 2024-08-26 ENCOUNTER — Other Ambulatory Visit: Payer: Self-pay | Admitting: Student

## 2024-08-26 DIAGNOSIS — M5431 Sciatica, right side: Secondary | ICD-10-CM

## 2024-09-06 ENCOUNTER — Other Ambulatory Visit (HOSPITAL_COMMUNITY)
Admission: RE | Admit: 2024-09-06 | Discharge: 2024-09-06 | Disposition: A | Discharge status: Home / Self Care | Source: Ambulatory Visit | Attending: Obstetrics and Gynecology | Admitting: Obstetrics and Gynecology

## 2024-09-06 ENCOUNTER — Ambulatory Visit: Admitting: Obstetrics and Gynecology

## 2024-09-06 ENCOUNTER — Other Ambulatory Visit: Payer: Self-pay

## 2024-09-06 VITALS — BP 126/76 | HR 78 | Wt 202.0 lb

## 2024-09-06 DIAGNOSIS — Z1331 Encounter for screening for depression: Secondary | ICD-10-CM

## 2024-09-06 DIAGNOSIS — N9489 Other specified conditions associated with female genital organs and menstrual cycle: Secondary | ICD-10-CM

## 2024-09-06 DIAGNOSIS — Z113 Encounter for screening for infections with a predominantly sexual mode of transmission: Secondary | ICD-10-CM | POA: Insufficient documentation

## 2024-09-06 DIAGNOSIS — N941 Unspecified dyspareunia: Secondary | ICD-10-CM | POA: Diagnosis not present

## 2024-09-06 DIAGNOSIS — N952 Postmenopausal atrophic vaginitis: Secondary | ICD-10-CM

## 2024-09-06 DIAGNOSIS — N898 Other specified noninflammatory disorders of vagina: Secondary | ICD-10-CM | POA: Insufficient documentation

## 2024-09-06 MED ORDER — ESTRING 7.5 MCG/24HR VA RING: 1.0000 | VAGINAL_RING | VAGINAL | 4 refills | Status: AC

## 2024-09-06 NOTE — Progress Notes (Signed)
 GYNECOLOGY VISIT  Patient name: BIRIDIANA TWARDOWSKI MRN [id number]  Date of birth: 1971-05-15 Chief Complaint:   Follow-up  History:  Opted to not start the vaginal tablet and instead continued the estring  and would prefer to continue the ring since she is not having postcoital bleeding. Burnign sensation remains. Self swab; yeast looking and discharge with itching.  Had bleeding when the ring came out and so decided to stick with the  Burning is constant regardless if there is discharge.  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   Health Maintenance:   Last pap     Component Value Date/Time   DIAGPAP  02/23/2024 1142    - Negative for intraepithelial lesion or malignancy (NILM)   DIAGPAP (A) 12/30/2020 1349    - Atypical squamous cells of undetermined significance (ASC-US )   DIAGPAP  10/06/2019 1143    - Negative for intraepithelial lesion or malignancy (NILM)   HPVHIGH Negative 02/23/2024 1142   HPVHIGH Negative 12/30/2020 1349   HPVHIGH Negative 10/06/2019 1143   ADEQPAP  02/23/2024 1142    Satisfactory for evaluation; transformation zone component ABSENT.   ADEQPAP  12/30/2020 1349    Satisfactory for evaluation; transformation zone component PRESENT.   ADEQPAP  10/06/2019 1143    Satisfactory for evaluation; transformation zone component PRESENT.    Health Maintenance  Topic Date Due   COVID-19 Vaccine (1) Never done   Pneumococcal Vaccine for age over 70 (1 of 2 - PCV) Never done   Hepatitis B Vaccine (1 of 3 - 19+ 3-dose series) Never done   Zoster (Shingles) Vaccine (1 of 2)  Never done   Complete foot exam   04/27/2024   Flu Shot  Never done   Eye exam for diabetics  09/13/2024   Hemoglobin A1C  12/09/2024   Yearly kidney function blood test for diabetes  03/13/2025   Yearly kidney health urinalysis for diabetes  06/08/2025   Breast Cancer Screening  03/23/2026   DTaP/Tdap/Td vaccine (2 - Td or Tdap) 07/07/2028   Pap with HPV screening  02/22/2029   Colon Cancer Screening  10/31/2031   Hepatitis C Screening  Completed   HIV Screening  Completed   HPV Vaccine  Aged Out   Meningitis B Vaccine  Aged Out      Review of Systems:  Pertinent items are noted in HPI. Comprehensive review of systems was otherwise negative.   Objective:  Physical Exam BP 126/76   Pulse 78   Wt 202 lb (91.6 kg)   LMP 07/13/2024 (Approximate)   BMI 30.71 kg/m    Physical Exam Vitals and nursing note reviewed.  Constitutional:      Appearance: Normal appearance.  HENT:     Head: Normocephalic and atraumatic.  Pulmonary:     Effort: Pulmonary effort is normal.  Skin:    General: Skin is warm and dry.  Neurological:     General: No focal deficit present.     Mental Status: She is alert.  Psychiatric:        Mood and Affect: Mood normal.        Behavior: Behavior  normal.        Thought Content: Thought content normal.        Judgment: Judgment normal.       Assessment & Plan:  1. Vaginal discharge (Primary) Self swab completed, follow up results. If candida confirmed, reports typically needs at least 2 diflucan  to treat completely  - Cervicovaginal ancillary only  2. Routine screening for STI (sexually transmitted infection) Added on STI testing to swab - Cervicovaginal ancillary only - RPR+HBsAg+HCVAb+...  3. Vaginal atrophy Refill of estring  sent to pharamcy  - ESTRING  7.5 MCG/24HR vaginal ring; Place 1 each vaginally every 3 (three) months. follow package directions  Dispense: 1 each; Refill: 4  4. Dyspareunia in female - ESTRING  7.5 MCG/24HR vaginal  ring; Place 1 each vaginally every 3 (three) months. follow package directions  Dispense: 1 each; Refill: 4  5. Vulvar burning Not sure if possibly due to HSV. Patient would like to stop valtrex  and see if there is a difference Will send mychart message with update after 1 week.  Will stop and if burning worsens will increase suppression to 1000mg  daily; considering complete suppression cessation as she is not sure if it is making a difference  If no changes in symptoms, will opt of alternate topical solution to trial and follow up to assess response   Carter Quarry, MD [facility], [facility]

## 2024-09-07 ENCOUNTER — Ambulatory Visit: Payer: Self-pay | Admitting: Obstetrics and Gynecology

## 2024-09-07 DIAGNOSIS — N9489 Other specified conditions associated with female genital organs and menstrual cycle: Secondary | ICD-10-CM

## 2024-09-07 DIAGNOSIS — N761 Subacute and chronic vaginitis: Secondary | ICD-10-CM

## 2024-09-07 LAB — RPR+HBSAG+HCVAB+...
HIV Screen 4th Generation wRfx: NONREACTIVE
Hep C Virus Ab: NONREACTIVE
Hepatitis B Surface Ag: NEGATIVE
RPR Ser Ql: NONREACTIVE

## 2024-09-07 LAB — CERVICOVAGINAL ANCILLARY ONLY
Bacterial Vaginitis (gardnerella): NEGATIVE
Candida Glabrata: NEGATIVE
Candida Vaginitis: NEGATIVE
Chlamydia: NEGATIVE
Comment: NEGATIVE
Comment: NEGATIVE
Comment: NEGATIVE
Comment: NEGATIVE
Comment: NEGATIVE
Comment: NORMAL
Neisseria Gonorrhea: NEGATIVE
Trichomonas: NEGATIVE

## 2024-09-11 ENCOUNTER — Encounter: Payer: Self-pay | Admitting: Student

## 2024-09-11 ENCOUNTER — Ambulatory Visit: Admitting: Student

## 2024-09-11 VITALS — BP 137/83 | HR 88 | Temp 97.8°F | Ht 68.0 in | Wt 204.6 lb

## 2024-09-11 DIAGNOSIS — Z79899 Other long term (current) drug therapy: Secondary | ICD-10-CM

## 2024-09-11 DIAGNOSIS — E119 Type 2 diabetes mellitus without complications: Secondary | ICD-10-CM | POA: Diagnosis present

## 2024-09-11 DIAGNOSIS — Z8249 Family history of ischemic heart disease and other diseases of the circulatory system: Secondary | ICD-10-CM | POA: Diagnosis not present

## 2024-09-11 DIAGNOSIS — Z7985 Long-term (current) use of injectable non-insulin antidiabetic drugs: Secondary | ICD-10-CM | POA: Diagnosis not present

## 2024-09-11 DIAGNOSIS — E785 Hyperlipidemia, unspecified: Secondary | ICD-10-CM

## 2024-09-11 DIAGNOSIS — Z87891 Personal history of nicotine dependence: Secondary | ICD-10-CM | POA: Diagnosis not present

## 2024-09-11 DIAGNOSIS — Z833 Family history of diabetes mellitus: Secondary | ICD-10-CM

## 2024-09-11 DIAGNOSIS — Z7984 Long term (current) use of oral hypoglycemic drugs: Secondary | ICD-10-CM

## 2024-09-11 LAB — POCT GLYCOSYLATED HEMOGLOBIN (HGB A1C): HbA1c, POC (controlled diabetic range): 6.1 % (ref 0.0–7.0)

## 2024-09-11 LAB — GLUCOSE, CAPILLARY: Glucose-Capillary: 172 mg/dL — ABNORMAL HIGH (ref 70–99)

## 2024-09-11 MED ORDER — METFORMIN HCL ER 500 MG PO TB24: 500.0000 mg | ORAL_TABLET | Freq: Every day | ORAL | 3 refills | Status: AC

## 2024-09-11 MED ORDER — SEMAGLUTIDE (1 MG/DOSE) 4 MG/3ML ~~LOC~~ SOPN
1.0000 mg | PEN_INJECTOR | SUBCUTANEOUS | 3 refills | Status: AC
Start: 2024-09-11 — End: ?

## 2024-09-11 NOTE — Assessment & Plan Note (Addendum)
 Status: Controlled. Last A1c of 7.0 in 05/2024.  A1c today is 6.1.  Currently taking metformin  1000 mg daily, Ozempic  1 mg weekly.  Foot exam completed today.  Patient expressed interest on decreasing metformin  dose, tolerating well without any acute concerns at this time.  Given improvement of A1c with increased Ozempic  dose will decrease metformin .  Plan -Continue Ozempic  1 mg -Decrease metformin  to 500 mg daily -A1c in 3 months (can span to every 6 months) -Ophthalmology exam: appt scheduled for 09/2024 - Dr. Octavia  -Urine ACR completed 05/2024 -LDL 118 on 05/2024, reports improved adherence with taking simvastatin  20 mg, denies any side effects at this time, plan for future lipid panel to assess LDL, discussed with patient about possibly switching over to atorvastatin or rosuvastatin  Orders:   POC Hbg A1C   Glucose, capillary   metformin  (GLUCOPHAGE -XR) 500 MG 24 hr tablet; Take 1 tablet (500 mg total) by mouth daily with breakfast.   Semaglutide , 1 MG/DOSE, 4 MG/3ML SOPN; Inject 1 mg into the skin once a week.

## 2024-09-11 NOTE — Assessment & Plan Note (Addendum)
 LDL 118 on 05/2024, reports improved adherence with taking simvastatin  20 mg, denies any side effects at this time, plan for future lipid panel to assess LDL, discussed with patient about possibly switching over to atorvastatin or rosuvastatin Orders:   Lipid Profile; Future

## 2024-09-11 NOTE — Patient Instructions (Addendum)
 Thank you, [name] for allowing us  to provide your care today. Today we discussed:  -A1c improved/stable 6.1%. Continue weekly Ozempic . Will decrease metformin  to 500 mg daily  -Will check cholesterol at lab only visit, continue simvastatin    I have ordered the following medication/changed the following medications:  Start the following medications: Meds ordered this encounter  Medications   metformin  (GLUCOPHAGE -XR) 500 MG 24 hr tablet    Sig: Take 1 tablet (500 mg total) by mouth daily with breakfast.    Dispense:  90 tablet    Refill:  3   Semaglutide , 1 MG/DOSE, 4 MG/3ML SOPN    Sig: Inject 1 mg into the skin once a week.    Dispense:  9 mL    Refill:  3     Follow up: 3 months   Should you have any questions or concerns please call the internal medicine clinic at [phone].    [name], D.O. [facility]

## 2024-09-11 NOTE — Progress Notes (Signed)
 CC: Routine visit  HPI: [name] is a 53 y.o. female living with a history stated below and presents today for routine visit. Please see problem based assessment and plan for additional details.  Past Medical History:  Diagnosis Date   Diabetes mellitus    Dizziness 01/06/2022   Ear pain, right 06/24/2022   Fibroid    Headache    Herpes    High cholesterol    History of COVID-19 09/10/2020   Hx of gonorrhea 2017   Stomach pain 06/24/2022    Current Outpatient Medications on File Prior to Visit  Medication Sig Dispense Refill   acetaminophen  (TYLENOL ) 500 MG tablet Take 1,000 mg by mouth 2 (two) times daily as needed for moderate pain.     Cholecalciferol (VITAMIN D3) 3000 units TABS Take 1 capsule by mouth daily.      clobetasol  (TEMOVATE ) 0.05 % external solution Apply 1 Application topically as directed. Apply to the areas on the scalp that are excessively itchy 2-3 times daily PRN 50 mL 9   cromolyn (OPTICROM) 4 % ophthalmic solution SMARTSIG:In Eye(s)     ESTRING  7.5 MCG/24HR vaginal ring Place 1 each vaginally every 3 (three) months. follow package directions 1 each 4   fish oil-omega-3 fatty acids 1000 MG capsule Take 1 g by mouth daily.     gabapentin  (NEURONTIN ) 100 MG capsule TAKE 2 CAPSULES(200 MG) BY MOUTH AT BEDTIME 180 capsule 3   ibuprofen  (ADVIL ) 800 MG tablet Take 1 tablet (800 mg total) by mouth every 8 (eight) hours as needed. 30 tablet 0   ketoconazole (NIZORAL) 2 % cream Apply topically 2 (two) times daily.     ondansetron  (ZOFRAN -ODT) 4 MG disintegrating tablet Take 1 tablet (4 mg total) by mouth every 8 (eight) hours as needed for nausea or vomiting. 20 tablet 0   RESTASIS 0.05 % ophthalmic emulsion 1 drop 2 (two) times daily.     senna-docusate (SENOKOT-S) 8.6-50 MG tablet Take 1 tablet by mouth at bedtime as needed for mild constipation. 30 tablet 1   simvastatin  (ZOCOR ) 20 MG tablet TAKE 1 TABLET BY MOUTH AT BEDTIME 90 tablet 3    triamcinolone  ointment (KENALOG ) 0.1 % Apply 1 Application topically 2 (two) times daily. Apply 2 times daily for 2 weeks then STOP 453 g 4   valACYclovir  (VALTREX ) 500 MG tablet Take 1 tablet daily with no outbreak. Take 1 tablet BID x 3 days with an outbreak 90 tablet 3   No current facility-administered medications on file prior to visit.    Family History  Problem Relation Age of Onset   Hypertension Mother    Diabetes Mother    Hypertension Father    Hypertension Other    Hyperlipidemia Other    Heart disease Other    Breast cancer Neg Hx    BRCA 1/2 Neg Hx     Social History   Socioeconomic History   Marital status: Single    Spouse name: Not on file   Number of children: Not on file   Years of education: Not on file   Highest education level: Not on file  Occupational History   Not on file  Tobacco Use   Smoking status: Former    Current packs/day: 0.00    Types: Cigarettes    Quit date: 06/09/2000    Years since quitting: 24.2    Passive exposure: Never   Smokeless tobacco: Never  Vaping Use   Vaping status: Never Used  Substance  and Sexual Activity   Alcohol  use: No   Drug use: Not Currently    Types: Marijuana   Sexual activity: Yes    Birth control/protection: None    Comment: Trinessa  Other Topics Concern   Not on file  Social History Narrative   Not on file   Social Drivers of Health   Financial Resource Strain: Low Risk  (07/28/2024)   Overall Financial Resource Strain (CARDIA)    Difficulty of Paying Living Expenses: Not very hard  Food Insecurity: No Food Insecurity (09/06/2024)   Hunger Vital Sign    Worried About Running Out of Food in the Last Year: Never true    Ran Out of Food in the Last Year: Never true  Transportation Needs: No Transportation Needs (07/28/2024)   PRAPARE - Administrator, Civil Service (Medical): No    Lack of Transportation (Non-Medical): No  Physical Activity: Not on file  Stress: No Stress Concern  Present (07/28/2024)   [employer] of Occupational Health - Occupational Stress Questionnaire    Feeling of Stress: Not at all  Social Connections: Unknown (07/28/2024)   Social Connection and Isolation Panel    Frequency of Communication with Friends and Family: More than three times a week    Frequency of Social Gatherings with Friends and Family: Twice a week    Attends Religious Services: More than 4 times per year    Active Member of [employer] or Organizations: No    Attends Banker Meetings: Never    Marital Status: Not on file  Intimate Partner Violence: Not At Risk (11/11/2023)   Humiliation, Afraid, Rape, and Kick questionnaire    Fear of Current or Ex-Partner: No    Emotionally Abused: No    Physically Abused: No    Sexually Abused: No    Review of Systems: ROS negative except for what is noted on the assessment and plan.  Vitals:   09/11/24 1015  BP: 137/83  Pulse: 88  Temp: 97.8 F (36.6 C)  TempSrc: Oral  SpO2: 97%  Weight: 204 lb 9.6 oz (92.8 kg)  Height: 5' 8 (1.727 m)   Physical Exam: Constitutional: Alert, sitting up in chair comfortably, in no acute distress Cardiovascular: regular rate and rhythm, 2+ DP pulses bilaterally Pulmonary/Chest: normal work of breathing on room air Neurological: alert & oriented x 3 Skin: warm and dry, no open wounds/cuts on bilateral feet  Assessment & Plan:   Assessment & Plan Type 2 diabetes mellitus without complication, without long-term current use of insulin (HCC) Status: Controlled. Last A1c of 7.0 in 05/2024.  A1c today is 6.1.  Currently taking metformin  1000 mg daily, Ozempic  1 mg weekly.  Foot exam completed today.  Patient expressed interest on decreasing metformin  dose, tolerating well without any acute concerns at this time.  Given improvement of A1c with increased Ozempic  dose will decrease metformin .  Plan -Continue Ozempic  1 mg -Decrease metformin  to 500 mg daily -A1c in 3 months (can span to  every 6 months) -Ophthalmology exam: appt scheduled for 09/2024 - Dr. Octavia  -Urine ACR completed 05/2024 -LDL 118 on 05/2024, reports improved adherence with taking simvastatin  20 mg, denies any side effects at this time, plan for future lipid panel to assess LDL, discussed with patient about possibly switching over to atorvastatin or rosuvastatin  Orders:   POC Hbg A1C   Glucose, capillary   metformin  (GLUCOPHAGE -XR) 500 MG 24 hr tablet; Take 1 tablet (500 mg total) by mouth daily  with breakfast.   Semaglutide , 1 MG/DOSE, 4 MG/3ML SOPN; Inject 1 mg into the skin once a week.  Hyperlipidemia, unspecified hyperlipidemia type LDL 118 on 05/2024, reports improved adherence with taking simvastatin  20 mg, denies any side effects at this time, plan for future lipid panel to assess LDL, discussed with patient about possibly switching over to atorvastatin or rosuvastatin Orders:   Lipid Profile; Future   Orders Placed This Encounter  Procedures   Glucose, capillary   Lipid Profile   POC Hbg A1C   Return in about 3 months (around 12/12/2024) for diabetes .   Patient discussed with Dr. Machen  Phoua Hoadley, D.O. [facility], PGY-3 Phone: [phone] Date 09/11/2024 Time 1:20 PM

## 2024-09-12 NOTE — Progress Notes (Signed)
 Internal Medicine Clinic Attending  Case discussed with the resident at the time of the visit.  We reviewed the resident's history and exam and pertinent patient test results.  I agree with the assessment, diagnosis, and plan of care documented in the resident's note.

## 2024-09-18 ENCOUNTER — Other Ambulatory Visit (HOSPITAL_COMMUNITY)
Admission: RE | Admit: 2024-09-18 | Discharge: 2024-09-18 | Disposition: A | Discharge status: Home / Self Care | Source: Ambulatory Visit | Attending: Family Medicine | Admitting: Family Medicine

## 2024-09-18 ENCOUNTER — Other Ambulatory Visit: Payer: Self-pay

## 2024-09-18 ENCOUNTER — Ambulatory Visit

## 2024-09-18 VITALS — BP 124/69 | HR 79 | Ht 67.0 in | Wt 202.0 lb

## 2024-09-18 DIAGNOSIS — N760 Acute vaginitis: Secondary | ICD-10-CM

## 2024-09-18 NOTE — Progress Notes (Signed)
 Tanya Austin is here with concern of vaginal itchiness and increased cloudy/yeast like vaginal discharge. These symptoms have been present for 3 days.   Self swab instructions given and specimen obtained. Explained patient will be contacted with any abnormal results.    Rosaline Pendleton, RN 09/18/2024  10:04 AM

## 2024-09-19 LAB — CERVICOVAGINAL ANCILLARY ONLY
Bacterial Vaginitis (gardnerella): NEGATIVE
Candida Glabrata: NEGATIVE
Candida Vaginitis: NEGATIVE
Chlamydia: NEGATIVE
Comment: NEGATIVE
Comment: NEGATIVE
Comment: NEGATIVE
Comment: NEGATIVE
Comment: NEGATIVE
Comment: NORMAL
Neisseria Gonorrhea: NEGATIVE
Trichomonas: NEGATIVE

## 2024-09-20 ENCOUNTER — Ambulatory Visit: Payer: Self-pay | Admitting: Family Medicine

## 2024-09-20 MED ORDER — LIDOCAINE 5 % EX OINT
1.0000 | TOPICAL_OINTMENT | CUTANEOUS | 0 refills | Status: AC | PRN
Start: 2024-09-20 — End: ?

## 2024-09-20 MED ORDER — CLINDAMYCIN PHOSPHATE 2 % VA CREA: 1.0000 | TOPICAL_CREAM | Freq: Every day | VAGINAL | 1 refills | Status: AC

## 2024-10-04 ENCOUNTER — Other Ambulatory Visit (INDEPENDENT_AMBULATORY_CARE_PROVIDER_SITE_OTHER)

## 2024-10-04 DIAGNOSIS — E785 Hyperlipidemia, unspecified: Secondary | ICD-10-CM

## 2024-10-05 LAB — LIPID PANEL
Chol/HDL Ratio: 3.8 ratio (ref 0.0–4.4)
Cholesterol, Total: 166 mg/dL (ref 100–199)
HDL: 44 mg/dL (ref >39)
LDL Chol Calc (NIH): 107 mg/dL — ABNORMAL HIGH (ref 0–99)
Triglycerides: 78 mg/dL (ref 0–149)
VLDL Cholesterol Cal: 15 mg/dL (ref 5–40)

## 2024-10-09 ENCOUNTER — Ambulatory Visit: Payer: Self-pay | Admitting: Student

## 2024-10-18 LAB — OPHTHALMOLOGY REPORT-SCANNED

## 2024-10-20 ENCOUNTER — Ambulatory Visit: Admitting: Obstetrics and Gynecology

## 2024-10-21 ENCOUNTER — Encounter (HOSPITAL_COMMUNITY): Payer: Self-pay

## 2024-10-21 ENCOUNTER — Ambulatory Visit (HOSPITAL_COMMUNITY)
Admission: EM | Admit: 2024-10-21 | Discharge: 2024-10-21 | Disposition: A | Discharge status: Home / Self Care | Attending: Nurse Practitioner | Admitting: Nurse Practitioner

## 2024-10-21 DIAGNOSIS — N898 Other specified noninflammatory disorders of vagina: Secondary | ICD-10-CM | POA: Insufficient documentation

## 2024-10-21 DIAGNOSIS — N760 Acute vaginitis: Secondary | ICD-10-CM | POA: Insufficient documentation

## 2024-10-21 DIAGNOSIS — R109 Unspecified abdominal pain: Secondary | ICD-10-CM | POA: Insufficient documentation

## 2024-10-21 DIAGNOSIS — Z3202 Encounter for pregnancy test, result negative: Secondary | ICD-10-CM | POA: Diagnosis not present

## 2024-10-21 LAB — POCT URINE PREGNANCY: Preg Test, Ur: NEGATIVE

## 2024-10-21 LAB — POCT URINE DIPSTICK
Bilirubin, UA: NEGATIVE
Blood, UA: NEGATIVE
Glucose, UA: NEGATIVE mg/dL
Leukocytes, UA: NEGATIVE
Nitrite, UA: NEGATIVE
POC PROTEIN,UA: NEGATIVE
Spec Grav, UA: 1.02 (ref 1.010–1.025)
Urobilinogen, UA: 0.2 U/dL
pH, UA: 6 (ref 5.0–8.0)

## 2024-10-21 MED ORDER — METRONIDAZOLE 500 MG PO TABS
500.0000 mg | ORAL_TABLET | Freq: Two times a day (BID) | ORAL | 0 refills | Status: AC
Start: 2024-10-21 — End: ?

## 2024-10-21 MED ORDER — FLUCONAZOLE 150 MG PO TABS: 150.0000 mg | ORAL_TABLET | ORAL | 0 refills | Status: AC

## 2024-10-21 NOTE — ED Provider Notes (Signed)
 [facility]    CSN: [id number] Arrival date & time: 10/21/24  1037      History   Chief Complaint Chief Complaint  Patient presents with   Vaginal Discharge    HPI Tanya Austin is a 53 y.o. female.   Discussed the use of AI scribe software for clinical note transcription with the patient, who gave verbal consent to proceed.   Patient presents with a several-day history of vaginal discharge, itching, and burning. She describes the discharge as clumpy white and notes that it collects around her estrogen vaginal ring, which she changes every three months. She also reports spotting since the last week of last month and intermittent lower abdominal cramping. Her last menstrual period was at the end of October. She denies nausea, vomiting, fever, or lower back pain. She has been sexually active with two female partners in the past three months and is currently active with one uncircumcised female partner, with inconsistent condom use. She states these symptoms have become more frequent since being with this partner.  The following sections of the patient's history were reviewed and updated as appropriate: allergies, current medications, past family history, past medical history, past social history, past surgical history, and problem list.       Past Medical History:  Diagnosis Date   Diabetes mellitus    Dizziness 01/06/2022   Ear pain, right 06/24/2022   Fibroid    Headache    Herpes    High cholesterol    History of COVID-19 09/10/2020   Hx of gonorrhea 2017   Stomach pain 06/24/2022    Patient Active Problem List   Diagnosis Date Noted   Lumbar radiculopathy 08/09/2024   Lumbar spondylosis 08/09/2024   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   Diabetic neuropathy (HCC) 11/11/2023   Obesity due to excess calories with serious comorbidity 06/15/2023   Esophageal dysphagia 06/15/2023   Constipation 06/15/2023   Vaginal  atrophy 06/15/2023   Hematuria 05/12/2023   Pruritic dermatitis 04/28/2023   Bilateral sciatica 03/30/2022   Tendinopathy of right rotator cuff 01/20/2022   Grieving 01/06/2022   Anxiety 12/29/2021   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 Authorizing Provider  fluconazole  (DIFLUCAN ) 150 MG tablet Take 1 tablet (150 mg total) by mouth every 3 (three) days for 2 doses. 10/21/24 10/25/24 Yes Iola Lukes, FNP  metroNIDAZOLE  (FLAGYL ) 500 MG tablet Take 1 tablet (500 mg total) by mouth 2 (two) times daily. 10/21/24  Yes Iola Lukes, FNP  acetaminophen  (TYLENOL ) 500 MG tablet Take 1,000 mg by mouth 2 (two) times daily as needed for moderate pain.  [provider]  Cholecalciferol (VITAMIN D3) 3000 units TABS Take 1 capsule by mouth daily.     [provider]  clindamycin  (CLEOCIN ) 2 % vaginal cream Place 1 Applicatorful vaginally at bedtime. 09/20/24   Ajewole, Christana, MD  clobetasol  (TEMOVATE ) 0.05 % external solution Apply 1 Application topically as directed. Apply to the areas on the scalp that are excessively itchy 2-3 times daily PRN 08/09/24   Alm Delon SAILOR, DO  cromolyn (OPTICROM) 4 % ophthalmic solution SMARTSIG:In Eye(s) 11/05/23   [provider]  ESTRING  7.5 MCG/24HR vaginal ring Place 1 each vaginally every 3 (three) months.  follow package directions 09/06/24   Ajewole, Christana, MD  fish oil-omega-3 fatty acids 1000 MG capsule Take 1 g by mouth daily.    [provider]  gabapentin  (NEURONTIN ) 100 MG capsule TAKE 2 CAPSULES(200 MG) BY MOUTH AT BEDTIME 08/28/24   Kandis Perkins, DO  ibuprofen  (ADVIL ) 800 MG tablet Take 1 tablet (800 mg total) by mouth every 8 (eight) hours as needed. 06/09/24   Tawkaliyar, Roya, DO  ketoconazole (NIZORAL) 2 % cream Apply topically 2 (two) times daily. 10/11/23   [provider]  lidocaine  (XYLOCAINE ) 5 % ointment Apply 1 Application topically as needed. 09/20/24   Ajewole, Christana, MD  metformin  (GLUCOPHAGE -XR) 500 MG 24 hr tablet Take 1 tablet (500 mg total) by mouth daily with breakfast. 09/11/24   Elicia Sharper, DO  ondansetron  (ZOFRAN -ODT) 4 MG disintegrating tablet Take 1 tablet (4 mg total) by mouth every 8 (eight) hours as needed for nausea or vomiting. 08/19/24   Teresa Norris A, PA-C  RESTASIS 0.05 % ophthalmic emulsion 1 drop 2 (two) times daily. 09/05/20   [provider]  Semaglutide , 1 MG/DOSE, 4 MG/3ML SOPN Inject 1 mg into the skin once a week. 09/11/24   Zheng, Michael, DO  senna-docusate (SENOKOT-S) 8.6-50 MG tablet Take 1 tablet by mouth at bedtime as needed for mild constipation. 07/28/24   Harrie Bruckner, DO  simvastatin  (ZOCOR ) 20 MG tablet TAKE 1 TABLET BY MOUTH AT BEDTIME 04/04/24   Kandis Perkins, DO  triamcinolone  ointment (KENALOG ) 0.1 % Apply 1 Application topically 2 (two) times daily. Apply 2 times daily for 2 weeks then STOP 08/09/24   Alm Delon SAILOR, DO  valACYclovir  (VALTREX ) 500 MG tablet Take 1 tablet daily with no outbreak. Take 1 tablet BID x 3 days with an outbreak 02/08/24   Kandis Perkins, DO    Family History Family History  Problem Relation Age of Onset   Hypertension Mother    Diabetes Mother    Hypertension Father    Hypertension Other    Hyperlipidemia Other    Heart disease Other    Breast cancer Neg Hx     BRCA 1/2 Neg Hx     Social History Social History   Tobacco Use   Smoking status: Former    Current packs/day: 0.00    Types: Cigarettes    Quit date: 06/09/2000    Years since quitting: 24.3    Passive exposure: Never   Smokeless tobacco: Never  Vaping Use   Vaping status: Never Used  Substance Use Topics   Alcohol  use: No   Drug use: Not Currently    Types: Marijuana     Allergies   Patient has no known allergies.   Review of Systems Review of Systems  Constitutional:  Negative for fever.  Gastrointestinal:  Positive for abdominal pain (lower abdomen cramping). Negative for nausea and vomiting.  Genitourinary:  Positive for dysuria, vaginal bleeding (currently having some light spotting for the past week) and vaginal discharge (clumpy, white). Negative for menstrual problem (LMP was the last week of October 2025).       Vaginal itching and burning. No odor.   Musculoskeletal:  Negative for back pain.  All other systems reviewed and are negative.    Physical Exam Triage Vital Signs ED Triage Vitals  Encounter Vitals Group     BP 10/21/24 1144 109/67     Girls Systolic BP Percentile --      Girls Diastolic BP Percentile --      Boys Systolic BP Percentile --      Boys Diastolic BP Percentile --      Pulse Rate 10/21/24 1144 90     Resp 10/21/24 1144 18     Temp 10/21/24 1144 97.9 F (36.6 C)     Temp Source 10/21/24 1144 Oral     SpO2 10/21/24 1144 98 %     Weight --      Height --      Head Circumference --      Peak Flow --      Pain Score 10/21/24 1142 0     Pain Loc --      Pain Education --      Exclude from Growth Chart --    No data found.  Updated Vital Signs BP 109/67 (BP Location: Left Arm)   Pulse 90   Temp 97.9 F (36.6 C) (Oral)   Resp 18   LMP 09/29/2024   SpO2 98%   Visual Acuity Right Eye Distance:   Left Eye Distance:   Bilateral Distance:    Right Eye Near:   Left Eye Near:    Bilateral Near:     Physical  Exam Constitutional:      General: She is not in acute distress.    Appearance: Normal appearance. She is not ill-appearing, toxic-appearing or diaphoretic.  HENT:     Head: Normocephalic.     Nose: Nose normal.     Mouth/Throat:     Mouth: Mucous membranes are moist.  Eyes:     Conjunctiva/sclera: Conjunctivae normal.  Cardiovascular:     Rate and Rhythm: Normal rate.  Pulmonary:     Effort: Pulmonary effort is normal.  Abdominal:     Palpations: Abdomen is soft.  Genitourinary:    Comments: Deferred; patient performed self-swab for Aptima testing  Musculoskeletal:        General: Normal range of motion.     Cervical back: Normal range of motion and neck supple.  Skin:    General: Skin is warm and dry.  Neurological:     General: No focal deficit present.     Mental Status: She is alert and oriented to person, place, and time.  Psychiatric:        Mood and Affect: Mood normal.        Behavior: Behavior normal.      UC Treatments / Results  Labs (all labs ordered are listed, but only abnormal results are displayed) Labs Reviewed  POCT URINE DIPSTICK - Abnormal; Notable for the following components:      Result Value   Clarity, UA turbid (*)    Ketones, POC UA trace (5) (*)    All other components within normal limits  POCT URINE PREGNANCY  CERVICOVAGINAL ANCILLARY ONLY    EKG   Radiology No results found.  Procedures Procedures (including critical care time)  Medications Ordered in UC Medications - No data to display  Initial Impression / Assessment and Plan / UC Course  I have reviewed the triage vital signs and the nursing notes.  Pertinent labs & imaging results that were available during my care of the patient were reviewed by me and considered in my medical decision making (see chart for details).     Patient presents with vaginal discharge, irritation, and burning. Vaginal swabs were collected for gonorrhea, chlamydia, trichomonas, yeast, and  bacterial vaginosis testing; results are pending. Empiric treatment initiated with metronidazole  (Flagyl ) and fluconazole  (Diflucan ). Further management will be guided by pending test results.  The patient was advised to maintain adequate hydration and to use barrier protection if sexually active to help reduce the risk of recurrent infection. She was informed that she will be contacted only if results are positive, though all results will be available for review through her MyChart account. She was instructed to follow up with her primary care provider or gynecologist if symptoms do not improve with treatment and to seek medical attention sooner if she develops fever, pelvic pain, or worsening discharge.  Today's evaluation has revealed no signs of a dangerous process. Discussed diagnosis with patient and/or guardian. Patient and/or guardian aware of their diagnosis, possible red flag symptoms to watch out for and need for close follow up. Patient and/or guardian understands verbal and written discharge instructions. Patient and/or guardian comfortable with plan and disposition.  Patient and/or guardian has a clear mental status at this time, good insight into illness (after discussion and teaching) and has clear judgment to make decisions regarding their care  Documentation was completed with the aid of voice recognition software. Transcription may contain typographical errors.   Final Clinical Impressions(s) / UC Diagnoses   Final diagnoses:  Vaginal discharge  Acute vaginitis  Abdominal cramping     Discharge Instructions      You were seen today for vaginal discharge, which can occur when the normal balance of bacteria in the vagina changes. This can be influenced by various factors, including new or multiple sexual partners, unprotected sex, douching, smoking, certain antibiotics, or pregnancy. It's also possible to develop a vaginal infection even without being sexually active.  Tests  were performed today to check for bacteria, yeast, gonorrhea, chlamydia, and trichomonas. While results are pending, treatment has been initiated for the most common non-STD causes--bacterial vaginosis and yeast infection. It is important that you avoid any sexual activity until your test results have returned, your treatment is complete, and your symptoms have fully resolved. You will only be contacted if any of your test results are positive. You can also review your results through your MyChart account.  During this time, avoid douching or using vaginal sprays or deodorants. Wear cotton or cotton-lined underwear to improve airflow and reduce moisture. Be sure to stay hydrated by drinking plenty of fluids. Please note that while you are taking the prescribed Flagyl  (metronidazole ) it is very important to avoid drinking alcohol  while on this medication. Drinking alcohol  can cause severe side effects, such as nausea, vomiting, and headaches. See your regular doctor or gynecologist if your symptoms do not start to improve with treatment. Go to the emergency room right away if you develop a fever, new or worsening pelvic pain, or if the vaginal discharge gets worse.     ED Prescriptions     Medication Sig Dispense Auth. Provider   metroNIDAZOLE  (FLAGYL ) 500 MG tablet Take 1 tablet (500 mg total) by  mouth 2 (two) times daily. 14 tablet Idriss Quackenbush, [place], FNP   fluconazole  (DIFLUCAN ) 150 MG tablet Take 1 tablet (150 mg total) by mouth every 3 (three) days for 2 doses. 2 tablet Iola Lukes, FNP      PDMP not reviewed this encounter.   Iola Lukes, [place] 10/21/24 1315

## 2024-10-21 NOTE — ED Triage Notes (Addendum)
 Pt present vaginal discharge, symptoms started two days ago. Pt states it having itching and burning.

## 2024-10-21 NOTE — Discharge Instructions (Addendum)
 You were seen today for vaginal discharge, which can occur when the normal balance of bacteria in the vagina changes. This can be influenced by various factors, including new or multiple sexual partners, unprotected sex, douching, smoking, certain antibiotics, or pregnancy. It's also possible to develop a vaginal infection even without being sexually active.  Tests were performed today to check for bacteria, yeast, gonorrhea, chlamydia, and trichomonas. While results are pending, treatment has been initiated for the most common non-STD causes--bacterial vaginosis and yeast infection. It is important that you avoid any sexual activity until your test results have returned, your treatment is complete, and your symptoms have fully resolved. You will only be contacted if any of your test results are positive. You can also review your results through your MyChart account.  During this time, avoid douching or using vaginal sprays or deodorants. Wear cotton or cotton-lined underwear to improve airflow and reduce moisture. Be sure to stay hydrated by drinking plenty of fluids. Please note that while you are taking the prescribed Flagyl  (metronidazole ) it is very important to avoid drinking alcohol  while on this medication. Drinking alcohol  can cause severe side effects, such as nausea, vomiting, and headaches. See your regular doctor or gynecologist if your symptoms do not start to improve with treatment. Go to the emergency room right away if you develop a fever, new or worsening pelvic pain, or if the vaginal discharge gets worse.

## 2024-10-23 ENCOUNTER — Ambulatory Visit (HOSPITAL_COMMUNITY): Payer: Self-pay

## 2024-10-23 LAB — CERVICOVAGINAL ANCILLARY ONLY
Bacterial Vaginitis (gardnerella): NEGATIVE
Candida Glabrata: NEGATIVE
Candida Vaginitis: POSITIVE — AB
Chlamydia: NEGATIVE
Comment: NEGATIVE
Comment: NEGATIVE
Comment: NEGATIVE
Comment: NEGATIVE
Comment: NEGATIVE
Comment: NORMAL
Neisseria Gonorrhea: NEGATIVE
Trichomonas: NEGATIVE

## 2024-11-28 ENCOUNTER — Telehealth: Payer: Self-pay | Admitting: Family Medicine

## 2024-11-28 NOTE — Telephone Encounter (Signed)
 Patient called stating that she needs a refill on diflucan .

## 2024-11-28 NOTE — Telephone Encounter (Signed)
Disregard this error

## 2024-11-29 ENCOUNTER — Telehealth: Payer: Self-pay | Admitting: Obstetrics and Gynecology

## 2024-11-29 MED ORDER — FLUCONAZOLE 150 MG PO TABS: 150.0000 mg | ORAL_TABLET | Freq: Once | ORAL | 1 refills | Status: AC

## 2024-11-29 NOTE — Telephone Encounter (Signed)
 Patient needs a call back today. She is very uncomfortable, and stated she needs her medication. She said she may walk in the office today.

## 2024-11-29 NOTE — Telephone Encounter (Signed)
Pt concern addressed in another encounter. ? ?Oskar Cretella,RN  ?

## 2024-11-29 NOTE — Addendum Note (Signed)
 Addended by: DAUNE Fieldon HERO on: 11/29/2024 09:54 AM   Modules accepted: Orders

## 2024-11-29 NOTE — Telephone Encounter (Signed)
 Patient called and requesting a refill on Diflucan  as she is having yeast infection symptoms.   Pt advised that we can send in an Rx for Diflucan  to her requested pharmacy [facility] on [address].  Medication sent.   Kailyn Dubie,RN  11/29/24

## 2025-01-23 ENCOUNTER — Ambulatory Visit: Admitting: Dermatology

## 2025-02-06 ENCOUNTER — Ambulatory Visit: Payer: Self-pay | Admitting: Obstetrics and Gynecology
